# Patient Record
Sex: Female | Born: 1955 | Race: White | Hispanic: No | Marital: Single | Smoking: Never smoker
Health system: Southern US, Community
[De-identification: ages and names within clinical notes are randomized; demographics above are authoritative.]

## PROBLEM LIST (undated history)

## (undated) DIAGNOSIS — Z8709 Personal history of other diseases of the respiratory system: Secondary | ICD-10-CM

## (undated) DIAGNOSIS — I10 Essential (primary) hypertension: Secondary | ICD-10-CM

## (undated) DIAGNOSIS — M810 Age-related osteoporosis without current pathological fracture: Secondary | ICD-10-CM

## (undated) DIAGNOSIS — J309 Allergic rhinitis, unspecified: Secondary | ICD-10-CM

## (undated) HISTORY — DX: Allergic rhinitis, unspecified: J30.9

## (undated) HISTORY — PX: COLONOSCOPY: SHX174

## (undated) HISTORY — DX: Age-related osteoporosis without current pathological fracture: M81.0

---

## 2001-05-05 ENCOUNTER — Encounter: Payer: Self-pay | Admitting: Family Medicine

## 2001-05-05 ENCOUNTER — Ambulatory Visit (HOSPITAL_COMMUNITY): Admission: RE | Admit: 2001-05-05 | Discharge: 2001-05-05 | Payer: Self-pay | Admitting: Family Medicine

## 2001-07-21 ENCOUNTER — Ambulatory Visit (HOSPITAL_COMMUNITY): Admission: RE | Admit: 2001-07-21 | Discharge: 2001-07-21 | Payer: Self-pay | Admitting: General Surgery

## 2002-05-11 ENCOUNTER — Ambulatory Visit (HOSPITAL_COMMUNITY): Admission: RE | Admit: 2002-05-11 | Discharge: 2002-05-11 | Payer: Self-pay | Admitting: Family Medicine

## 2002-05-11 ENCOUNTER — Encounter: Payer: Self-pay | Admitting: Family Medicine

## 2002-05-18 ENCOUNTER — Ambulatory Visit (HOSPITAL_COMMUNITY): Admission: RE | Admit: 2002-05-18 | Discharge: 2002-05-18 | Payer: Self-pay | Admitting: Family Medicine

## 2002-05-18 ENCOUNTER — Encounter: Payer: Self-pay | Admitting: Family Medicine

## 2003-05-17 ENCOUNTER — Ambulatory Visit (HOSPITAL_COMMUNITY): Admission: RE | Admit: 2003-05-17 | Discharge: 2003-05-17 | Payer: Self-pay | Admitting: Family Medicine

## 2004-05-22 ENCOUNTER — Ambulatory Visit (HOSPITAL_COMMUNITY): Admission: RE | Admit: 2004-05-22 | Discharge: 2004-05-22 | Payer: Self-pay | Admitting: Family Medicine

## 2005-01-25 ENCOUNTER — Ambulatory Visit (HOSPITAL_COMMUNITY): Admission: RE | Admit: 2005-01-25 | Discharge: 2005-01-25 | Payer: Self-pay | Admitting: General Surgery

## 2005-05-24 ENCOUNTER — Ambulatory Visit (HOSPITAL_COMMUNITY): Admission: RE | Admit: 2005-05-24 | Discharge: 2005-05-24 | Payer: Self-pay | Admitting: Family Medicine

## 2006-02-14 ENCOUNTER — Encounter: Payer: Self-pay | Admitting: Orthopedic Surgery

## 2006-02-14 ENCOUNTER — Ambulatory Visit (HOSPITAL_COMMUNITY): Admission: RE | Admit: 2006-02-14 | Discharge: 2006-02-14 | Payer: Self-pay | Admitting: Family Medicine

## 2006-05-26 ENCOUNTER — Ambulatory Visit (HOSPITAL_COMMUNITY): Admission: RE | Admit: 2006-05-26 | Discharge: 2006-05-26 | Payer: Self-pay | Admitting: Family Medicine

## 2007-05-29 ENCOUNTER — Ambulatory Visit (HOSPITAL_COMMUNITY): Admission: RE | Admit: 2007-05-29 | Discharge: 2007-05-29 | Payer: Self-pay | Admitting: Family Medicine

## 2008-05-01 ENCOUNTER — Encounter: Payer: Self-pay | Admitting: Orthopedic Surgery

## 2008-05-01 ENCOUNTER — Ambulatory Visit (HOSPITAL_COMMUNITY): Admission: RE | Admit: 2008-05-01 | Discharge: 2008-05-01 | Payer: Self-pay | Admitting: Family Medicine

## 2008-05-29 ENCOUNTER — Ambulatory Visit (HOSPITAL_COMMUNITY): Admission: RE | Admit: 2008-05-29 | Discharge: 2008-05-29 | Payer: Self-pay | Admitting: Family Medicine

## 2008-06-26 ENCOUNTER — Ambulatory Visit: Payer: Self-pay | Admitting: Orthopedic Surgery

## 2008-06-26 DIAGNOSIS — M25569 Pain in unspecified knee: Secondary | ICD-10-CM | POA: Insufficient documentation

## 2008-06-26 DIAGNOSIS — M1710 Unilateral primary osteoarthritis, unspecified knee: Secondary | ICD-10-CM

## 2008-06-26 DIAGNOSIS — IMO0002 Reserved for concepts with insufficient information to code with codable children: Secondary | ICD-10-CM | POA: Insufficient documentation

## 2008-06-27 ENCOUNTER — Ambulatory Visit (HOSPITAL_COMMUNITY): Admission: RE | Admit: 2008-06-27 | Discharge: 2008-06-27 | Payer: Self-pay | Admitting: Family Medicine

## 2008-07-10 ENCOUNTER — Ambulatory Visit: Admission: RE | Admit: 2008-07-10 | Discharge: 2008-07-10 | Payer: Self-pay | Admitting: Orthopedic Surgery

## 2008-07-10 ENCOUNTER — Encounter: Payer: Self-pay | Admitting: Orthopedic Surgery

## 2009-06-03 ENCOUNTER — Ambulatory Visit (HOSPITAL_COMMUNITY): Admission: RE | Admit: 2009-06-03 | Discharge: 2009-06-03 | Payer: Self-pay | Admitting: Family Medicine

## 2010-06-05 ENCOUNTER — Ambulatory Visit (HOSPITAL_COMMUNITY)
Admission: RE | Admit: 2010-06-05 | Discharge: 2010-06-05 | Payer: Self-pay | Source: Home / Self Care | Attending: Family Medicine | Admitting: Family Medicine

## 2010-06-07 DIAGNOSIS — M810 Age-related osteoporosis without current pathological fracture: Secondary | ICD-10-CM

## 2010-06-07 HISTORY — DX: Age-related osteoporosis without current pathological fracture: M81.0

## 2010-06-24 ENCOUNTER — Ambulatory Visit (HOSPITAL_COMMUNITY)
Admission: RE | Admit: 2010-06-24 | Discharge: 2010-06-24 | Payer: Self-pay | Source: Home / Self Care | Attending: Family Medicine | Admitting: Family Medicine

## 2010-07-08 HISTORY — PX: OTHER SURGICAL HISTORY: SHX169

## 2010-07-14 ENCOUNTER — Other Ambulatory Visit: Payer: Self-pay | Admitting: Orthopedic Surgery

## 2010-07-14 ENCOUNTER — Ambulatory Visit (HOSPITAL_COMMUNITY)
Admission: RE | Admit: 2010-07-14 | Discharge: 2010-07-14 | Disposition: A | Discharge status: Home / Self Care | Payer: BC Managed Care – PPO | Source: Ambulatory Visit | Attending: Orthopedic Surgery | Admitting: Orthopedic Surgery

## 2010-07-14 ENCOUNTER — Other Ambulatory Visit (HOSPITAL_COMMUNITY): Payer: Self-pay | Admitting: Orthopedic Surgery

## 2010-07-14 ENCOUNTER — Encounter (HOSPITAL_COMMUNITY): Payer: BC Managed Care – PPO

## 2010-07-14 DIAGNOSIS — M169 Osteoarthritis of hip, unspecified: Secondary | ICD-10-CM

## 2010-07-14 DIAGNOSIS — Z01818 Encounter for other preprocedural examination: Secondary | ICD-10-CM | POA: Insufficient documentation

## 2010-07-14 LAB — SURGICAL PCR SCREEN: MRSA, PCR: NEGATIVE

## 2010-07-14 LAB — CBC
HCT: 43.7 % (ref 36.0–46.0)
RDW: 13.8 % (ref 11.5–15.5)
WBC: 6.5 10*3/uL (ref 4.0–10.5)

## 2010-07-14 LAB — BASIC METABOLIC PANEL
BUN: 11 mg/dL (ref 6–23)
CO2: 29 mEq/L (ref 19–32)
GFR calc non Af Amer: >60 mL/min (ref >60)
Glucose, Bld: 95 mg/dL (ref 70–99)
Potassium: 3.6 mEq/L (ref 3.5–5.1)

## 2010-07-14 LAB — APTT: aPTT: 33 seconds (ref 24–37)

## 2010-07-23 ENCOUNTER — Inpatient Hospital Stay (HOSPITAL_COMMUNITY)
Admission: RE | Admit: 2010-07-23 | Discharge: 2010-07-27 | DRG: 818 | Disposition: A | Discharge status: Home Health | Payer: BC Managed Care – PPO | Attending: Orthopedic Surgery | Admitting: Orthopedic Surgery

## 2010-07-23 ENCOUNTER — Inpatient Hospital Stay (HOSPITAL_COMMUNITY): Payer: BC Managed Care – PPO

## 2010-07-23 DIAGNOSIS — M1710 Unilateral primary osteoarthritis, unspecified knee: Secondary | ICD-10-CM | POA: Diagnosis present

## 2010-07-23 DIAGNOSIS — M169 Osteoarthritis of hip, unspecified: Principal | ICD-10-CM | POA: Diagnosis present

## 2010-07-23 DIAGNOSIS — M1610 Unilateral primary osteoarthritis, unspecified hip: Principal | ICD-10-CM | POA: Diagnosis present

## 2010-07-23 DIAGNOSIS — I10 Essential (primary) hypertension: Secondary | ICD-10-CM | POA: Diagnosis present

## 2010-07-23 DIAGNOSIS — D62 Acute posthemorrhagic anemia: Secondary | ICD-10-CM | POA: Diagnosis not present

## 2010-07-23 LAB — ABO/RH: ABO/RH(D): O POS

## 2010-07-24 LAB — CBC
Hemoglobin: 9.6 g/dL — ABNORMAL LOW (ref 12.0–15.0)
Platelets: 224 10*3/uL (ref 150–400)
RBC: 3.4 MIL/uL — ABNORMAL LOW (ref 3.87–5.11)

## 2010-07-24 LAB — PROTIME-INR
INR: 1.06 (ref 0.00–1.49)
Prothrombin Time: 14 seconds (ref 11.6–15.2)

## 2010-07-25 LAB — CBC
HCT: 23.3 % — ABNORMAL LOW (ref 36.0–46.0)
Hemoglobin: 7.3 g/dL — ABNORMAL LOW (ref 12.0–15.0)
MCH: 28.4 pg (ref 26.0–34.0)
MCHC: 32.2 g/dL (ref 30.0–36.0)
RBC: 2.64 MIL/uL — ABNORMAL LOW (ref 3.87–5.11)

## 2010-07-25 LAB — PROTIME-INR: INR: 1.85 — ABNORMAL HIGH (ref 0.00–1.49)

## 2010-07-26 LAB — CBC
MCHC: 32.7 g/dL (ref 30.0–36.0)
Platelets: 156 10*3/uL (ref 150–400)
RDW: 14.3 % (ref 11.5–15.5)
WBC: 6.3 10*3/uL (ref 4.0–10.5)

## 2010-07-26 LAB — TYPE AND SCREEN
ABO/RH(D): O POS
Unit division: 0

## 2010-07-26 LAB — PROTIME-INR: INR: 1.59 — ABNORMAL HIGH (ref 0.00–1.49)

## 2010-07-27 LAB — PROTIME-INR: INR: 1.45 (ref 0.00–1.49)

## 2010-07-31 NOTE — Op Note (Signed)
NAME:  Michelle Landry, Michelle Landry                   ACCOUNT NO.:  [id number]  MEDICAL RECORD NO.:  [id number]           PATIENT TYPE:  I  LOCATION:  1617                         FACILITY:  [facility]  PHYSICIAN:  Marlowe Kays, M.D.  DATE OF BIRTH:  [birth year]  DATE OF PROCEDURE:  07/23/2010 DATE OF DISCHARGE:                              OPERATIVE REPORT   PREOPERATIVE DIAGNOSIS:  Osteoarthritis, left hip.  POSTOPERATIVE DIAGNOSIS:  Osteoarthritis, left hip.  OPERATION:  Osteonics total hip replacement, left.  SURGEON:  Marlowe Kays, M.D.  ASSISTANT:  Georges Lynch. Darrelyn Hillock, M.D.  ANESTHESIA:  General.  PATHOLOGIC JUSTIFICATION FOR PROCEDURE:  Painful left hip with no joint space remaining superiorly and only partial coverage of her femoral head.  PROCEDURE:  Prophylactic antibiotics, satisfactory general anesthesia, Foley catheter inserted, right lateral decubitus position with left hip prepped with DuraPrep and draped in sterile field.  Collier Flowers and Mark II frame employed.  Time-out performed.  Posterolateral incision down to the fascia lata.  External rotators were detached from the femur and the hip capsule partially excised and hip dislocated.  I then amputated the femoral head at the top femoral neck and cleared the piriformis fossa of soft tissue.  I then placed a guide pin down in it over drilling with a step-cut drill followed by the canal finder.  We then began the vertical reaming process up to number 8 and along the way made my final femoral neck cut roughly a fingerbreadth above the lesser trochanter.  I then began rasping the canal and worked up to size 8 which was a nice tight fit.  I then switched to the acetabulum and completed the capsulectomy and then began deepening and expanding reamer process with some medialization for the femoral head to be more covered.  We went up to 54 size reamer with good cancellous bone.  Trial prosthesis fit nicely.  We then went ahead and placed  the final Trident PSL acetabular shell which I stabilized with 2 screws.  I followed this with a 46-mm insert with 28- mm head.  We then returned to the femur and placed the final size 8, secured fit 127-degree angle noncemented component and then sized it at +5.  Accordingly, the final cap was placed on the C tapered head, used a Biolox femoral head.  Hip was reduced and found be nice and stable.  We then irrigated the wound well with sterile saline and closed the wound with interrupted #1 Vicryl starting at the external rotators and fascia lata, #1 and 2-0 Vicryl in subcutaneous tissue, staples in the skin. Betadine, Adaptic dry sterile dressings were applied.  She was placed in abduction pillow while still in the Medina II frame and gently placed on her PACU bed and taken in a satisfactory condition with no known complications.  Estimated blood loss was 800 cc.  No blood replacement.          ______________________________ Marlowe Kays, M.D.     JA/MEDQ  D:  07/23/2010  T:  07/24/2010  Job:  [id number]  Electronically Signed by Marlowe Kays M.D. on 07/31/2010 04:22:36 PM

## 2010-08-24 NOTE — Discharge Summary (Signed)
NAME:  Michelle Landry, Michelle Landry                   ACCOUNT NO.:  [id number]  MEDICAL RECORD NO.:  [id number]           PATIENT TYPE:  I  LOCATION:  1617                         FACILITY:  [facility]  PHYSICIAN:  Marlowe Kays, M.D.  DATE OF BIRTH:  Jan 12, 1956  DATE OF ADMISSION:  07/23/2010 DATE OF DISCHARGE:  07/27/2010                              DISCHARGE SUMMARY   ADMITTING DIAGNOSES: 1. Osteoarthritis of left hip. 2. Osteoarthritis of right knee. 3. Hypertension.  DISCHARGE DIAGNOSES: 1. Osteoarthritis of left hip. 2. Osteoarthritis of right knee. 3. Hypertension. 4. Mild postoperative anemia, treated with transfusion.  OPERATION:  On July 23, 2010, the patient underwent Osteonics total hip replacement arthroplasty of the left hip, Dr. Ranee Gosselin assisted.  BRIEF HISTORY:  This 55 year old lady had increasing problems concerning her left hip.  She also had problem with the right knee and osteoarthritis of both of them.  She stands on concrete floor for long periods of time due to her job and she is having considerable amount of problems performing that.  She showed deterioration of that hip and the right knee as well.  We after much consideration and discussion expelled that the left hip was bothering her more than the knee and it was decided to go ahead with the above procedure.  COURSE IN HOSPITAL:  The patient tolerated surgical procedure quite well.  She was placed in the total hip protocol postoperatively as well as placed on Coumadin protocol.  She worked diligently with physical therapy maintaining appropriate weightbearing status on the operative side.  She did have hypotensive episode postoperatively.  This was felt due to accumulation of her medications and analgesics and once those were changed, she had no problem after that.  Over the weekend, prior to discharge, she worked with Physical Therapy, received transfusion for 2 units of packed cells which made her feel  much better and more into the protocol.  The wound remained clean and dry postoperatively.  The neurovascular was intact to the operative side.  It was felt she could be maintained in home environment and arrangements were made for discharge.  LABORATORY DATA:  Laboratory values in the hospital, postoperatively she had a drop in her hemoglobin which was expected and she did drop to 7.3 on the hemoglobin and 23.3 on the hematocrit.  It was felt this was too low for safety of the patient, so she was transfused 2 units of blood which brought her up to 8.7, hematocrit was 26.6.  INR in the hospital was 1.45.  MRSA was negative.  CONDITION ON DISCHARGE:  Improved, stable.  PLAN:  The patient is to continue with her multivitamins.  She takes potassium chloride 20 mEq every morning, indapamide 1.25 mg dose daily, lisinopril 20 mg dose every day.  She was given Robaxin as a muscle relaxant and Tylox for pain control.  Coumadin protocol, continue 4 weeks after date of surgery.  She will return to see Dr. Simonne Come in about 2 weeks.  Dry dressing to the wound on as-needed basis and she is urged to call the office should she have any problems  or questions.     Dooley L. Cherlynn June.   ______________________________ Marlowe Kays, M.D.    DLU/MEDQ  D:  08/20/2010  T:  08/20/2010  Job:  [id number]  cc:   Donna Bernard, M.D. Fax: [phone]  Marlowe Kays, M.D. Fax: [phone]  Electronically Signed by Marlowe Kays M.D. on 08/24/2010 11:21:59 AM

## 2010-12-06 HISTORY — PX: OTHER SURGICAL HISTORY: SHX169

## 2010-12-17 ENCOUNTER — Encounter (HOSPITAL_COMMUNITY): Payer: BC Managed Care – PPO

## 2010-12-17 ENCOUNTER — Other Ambulatory Visit: Payer: Self-pay | Admitting: Orthopedic Surgery

## 2010-12-17 LAB — APTT: aPTT: 29 seconds (ref 24–37)

## 2010-12-17 LAB — BASIC METABOLIC PANEL
GFR calc Af Amer: >60 mL/min (ref >60)
GFR calc non Af Amer: >60 mL/min (ref >60)
Glucose, Bld: 97 mg/dL (ref 70–99)
Potassium: 3.9 mEq/L (ref 3.5–5.1)
Sodium: 137 mEq/L (ref 135–145)

## 2010-12-17 LAB — CBC
Hemoglobin: 13.4 g/dL (ref 12.0–15.0)
MCH: 28 pg (ref 26.0–34.0)
RBC: 4.79 MIL/uL (ref 3.87–5.11)
WBC: 4.6 10*3/uL (ref 4.0–10.5)

## 2010-12-17 LAB — PROTIME-INR: Prothrombin Time: 12.9 seconds (ref 11.6–15.2)

## 2010-12-17 LAB — SURGICAL PCR SCREEN
MRSA, PCR: NEGATIVE
Staphylococcus aureus: NEGATIVE

## 2010-12-17 NOTE — H&P (Addendum)
Michelle Landry, Michelle Landry                   ACCOUNT NO.:  [id number]  MEDICAL RECORD NO.:  [id number]  LOCATION:                                 FACILITY:  PHYSICIAN:  Marlowe Kays, M.D.  DATE OF BIRTH:  1955-11-14  DATE OF ADMISSION: DATE OF DISCHARGE:                             HISTORY & PHYSICAL   DATE OF ADMISSION:  December 23, 2010  CHIEF COMPLAINT:  Pain in my right knee.  PRESENT ILLNESS:  This 55 year old white female has been seen by [place] for continued progressive problems concerning pain into her right knee.  She has had problems, which now has markedly interfered with her day-to-day activities.  She has had problems with this knee throughout the years and has continued with a rather laborious job now for quite a while. She has developed considerable amount of arthritic changes, particularly in the medial compartment of the right knee with bone-on-bone deformity. This appears to be severe end-stage arthritis of this knee.  She is a relatively young lady and she would like to maintain as much activity and quality of life as she possibly can.  She has successfully undergone right total knee replacement arthroplasty, has done very well with that and is highly desirous to have a right knee done.  After all the risks and benefits of surgery were described to the patient, then we decided to go ahead with total knee replacement arthroplasty.  PAST MEDICAL HISTORY:  This lady has been in relatively good health throughout her lifetime under the direction of Dr. Simone Curia.  PAST MEDICAL HISTORY:  She is allergic to SULFA drugs, which mainly cause stomach discomfort and PERCOCET causes dizziness and nausea; however, she has no problems with Vicodin.  She has no food, latex, or metal allergies.  CURRENT MEDICATIONS: 1. Indapamide 1.25 mg every morning. 2. Lisinopril 20 mg tablets 1 q.a.m. 3. Potassium chloride 20 mEq tabs each morning with food. 4. Meloxicam 15 mg each morning  with food. 5. One-A-Day ladies vitamin 1 in the morning. 6. Motrin IV when needed (will not take prior to surgery). 7. Tums p.r.n.  She had been treated primarily for hypertension.  She also has some mild reflux.  PAST SURGICAL HISTORY:  Right total knee replacement arthroplasty done at [facility] on July 23, 2010.  FAMILY HISTORY:  Positive for father dying at 64 of a massive coronary event.  Mother with osteoporosis, macular degeneration, and arthritis. She has an older sister who died of congestive heart failure and the family history is positive for arthritis and fibromyalgia.  SOCIAL HISTORY:  The patient is single.  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  She never had intake of alcohol or tobacco products.  Her home environment consists of a family unit.  She lives with her mother.  She plans to have home health with [facility] after surgery.  REVIEW OF SYSTEMS:  CNS:  No seizures, stroke, paralysis, numbness, double vision.  RESPIRATORY:  No productive cough, no hemoptysis, no shortness of breath.  CARDIOVASCULAR:  No chest pain, no angina, no orthopnea.  GASTROINTESTINAL:  No nausea, vomiting, melena, bleeding stool.  GENITOURINARY:  No discharge, dysuria, hematuria. MUSCULOSKELETAL:  Pleasant, well-groomed 55 year old white female who does walk with somewhat of a lift of the right knee trying to guard it.  PHYSICAL EXAMINATION:  VITAL SIGNS:  Blood pressure 128/64, seated right arm.  Respirations are 12 and unlabored, pulse 72 and regular. HEENT:  Normocephalic.  PERRLA.  EOMs intact.  Oropharynx is clear. CHEST:  Clear to auscultation.  No rhonchi, no rales. HEART:  Regular rate and rhythm.  No murmurs were heard. ABDOMEN:  Soft, nontender.  Liver and spleen not felt. GENITALIA:  Not done, not pertinent to present illness. RECTAL:  Not done, not pertinent to present illness. PELVIC:  Not  done, not pertinent to present illness. BREASTS:  Not done, not pertinent to present illness. EXTREMITIES:  She has pain and crepitus with range of motion of right knee with some mild valgus deformity.  ADMISSION DIAGNOSES: 1. Osteoarthritis of the right knee. 2. Hypertension. 3. Status post left hip replacement arthroplasty.  PLAN:  The patient will undergo right total knee replacement arthroplasty.  Plan is to go home after regular hospitalization with home health with [place].  Today, all questions were answered again.  No medications were asked for, nor given.     Crystalina Stodghill L. Cherlynn June.   ______________________________ Marlowe Kays, M.D.    DLU/MEDQ  D:  12/14/2010  T:  12/14/2010  Job:  [id number]  cc:   Donna Bernard, M.D. Fax: [phone]  Electronically Signed by Marlowe Kays M.D. on 12/17/2010 12:20:05 PM Electronically Signed by Alvera Novel  on 01/08/2011 04:20:37 PM

## 2010-12-23 ENCOUNTER — Inpatient Hospital Stay (HOSPITAL_COMMUNITY): Payer: BC Managed Care – PPO

## 2010-12-23 ENCOUNTER — Inpatient Hospital Stay (HOSPITAL_COMMUNITY)
Admission: RE | Admit: 2010-12-23 | Discharge: 2010-12-27 | DRG: 209 | Disposition: A | Discharge status: Home Health | Payer: BC Managed Care – PPO | Source: Ambulatory Visit | Attending: Orthopedic Surgery | Admitting: Orthopedic Surgery

## 2010-12-23 DIAGNOSIS — E876 Hypokalemia: Secondary | ICD-10-CM | POA: Diagnosis not present

## 2010-12-23 DIAGNOSIS — Z96649 Presence of unspecified artificial hip joint: Secondary | ICD-10-CM

## 2010-12-23 DIAGNOSIS — I10 Essential (primary) hypertension: Secondary | ICD-10-CM | POA: Diagnosis present

## 2010-12-23 DIAGNOSIS — D649 Anemia, unspecified: Secondary | ICD-10-CM | POA: Diagnosis not present

## 2010-12-23 DIAGNOSIS — M1710 Unilateral primary osteoarthritis, unspecified knee: Principal | ICD-10-CM | POA: Diagnosis present

## 2010-12-23 DIAGNOSIS — Z01812 Encounter for preprocedural laboratory examination: Secondary | ICD-10-CM

## 2010-12-23 LAB — CROSSMATCH: ABO/RH(D): O POS

## 2010-12-24 LAB — CBC
HCT: 33.1 % — ABNORMAL LOW (ref 36.0–46.0)
MCH: 28.4 pg (ref 26.0–34.0)
MCV: 87.8 fL (ref 78.0–100.0)
Platelets: 221 10*3/uL (ref 150–400)
RDW: 13.2 % (ref 11.5–15.5)

## 2010-12-24 LAB — BASIC METABOLIC PANEL
BUN: 12 mg/dL (ref 6–23)
Calcium: 9 mg/dL (ref 8.4–10.5)
Creatinine, Ser: 0.62 mg/dL (ref 0.50–1.10)
GFR calc Af Amer: >60 mL/min (ref >60)

## 2010-12-24 NOTE — Op Note (Signed)
Michelle Landry, Michelle Landry                   ACCOUNT NO.:  [id number]  MEDICAL RECORD NO.:  [id number]  LOCATION:  0003                         FACILITY:  [facility]  PHYSICIAN:  Marlowe Kays, M.D.  DATE OF BIRTH:  1955-06-09  DATE OF PROCEDURE:  12/23/2010 DATE OF DISCHARGE:                              OPERATIVE REPORT   PREOPERATIVE DIAGNOSIS:  Osteoarthritis, right knee.  POSTOPERATIVE DIAGNOSIS:  Osteoarthritis, right knee.  OPERATION:  Osteonics total knee replacement on the right.  SURGEON:  Marlowe Kays, M.D.  ASSISTANTDruscilla Brownie. Idolina Primer, P.A-C.  ANESTHESIA:  General.  PLAN/JUSTIFICATION FOR PROCEDURE:  She had bone-on-bone abutment in the medial knee with significant pain.  DESCRIPTION OF PROCEDURE:  Prophylactic antibiotics, satisfied general anesthesia, Foley catheter inserted with protection of the left total hip, lateral hip positioner, pneumatic tourniquet, right leg was prepped with DuraPrep from tourniquet to ankle and draped in sterile field. Ioban employed.  Time-out performed.  Vertical midline incision down to patellar mechanism with median parapatellar incision to open the joint. Pes anserinus and medial collateral ligament were undermined off the proximal tibia.  The patellar mechanism was freed up, patella everted and the knee flexed.  Osteophytes were removed from around the patella and the femur, patella sized at 26.  I removed portions of the anterior menisci and ACL, PCL, and made a 5-6 inch drill hole in the distal femur followed by the canal finder and the axis aligner for a 5 degree valgus cut.  I took 10 mm off the distal femur since she had no flexion contracture.  We then sized the distal femur with a sizing jig at a #7. I followed this with the distal cutting jig making anterior and posterior cuts and posterior and anterior chamferings.  Returning to the tibia, I removed additional remnants of the menisci and ACL, PCL complex with a leveling  cut.  I then sized the proximal tibia at 7 and using the baseplate made my initial drill hole followed by step-cut drill, canal finder, and intramedullary rod.  We then used the 9 degrees cutting jig and the feeler for taking 4 mm off the slightly depressed medial tibial plateau, 4 mm cup was made.  I then placed lamina spreader removing remnants of bone and soft tissue from behind the femoral condyles.  I followed this with the jig for creating both the patellar groove and the notchplasty and after completing these we went through a trial reduction, found a 10 mm spacer fit nicely.  Using the external rod, I split the bimalleolar distance and found the components to be stable at this location.  After marking scribe lines on the anterior tibia, we continued the knee extension and went ahead and used the 10 mm recess cutting jig to cut for the patella followed by the guide for making three fixation holes.  I then used a trial patella and removed bone from around the perimeter until the patellar button was prior to the surrounding perimeter.  Then we turned to the tibia and I used the size 7 baseplate attaching to the proximal tibia with 3 pins and then used tripod apparatus to ream from the tibial  keel up to the 7 cemented. Following this, I used a hand reamer and reamed up to 13 for intramedullary stem, used the box cut and then went through a trial reduction with the 8 mm x 13 mm stem attached to the tibial component. This went nicely.  Accordingly, we went ahead and assembled the components, mixed methylmethacrylate and irrigated the wound well with the Waterpik.  I then individually glued in the components starting first with the tibia, placing methylmethacrylate only on the tibial component, impacting it and removing excess methylmethacrylate. Followed this by gluing in the femur and again impacting and removing excess methylmethacrylate with the 10 mm spacer in place, we held  the knee in extension while we glued in the patella, holding the patella with holding clamp.  When methylmethacrylate hardened, we checked and removed small amounts of particulate methylmethacrylate from around the components and then placed the final 10 mm posterior stabilized size 7 insert reducing the knee and found it to be nice and stable.  I did perform a lateral release.  I infiltrated all soft tissues with 0.25% Marcaine with adrenaline and also placed some Gelfoam in the distal femoral hole.  Hemovac was placed and the wound was then closed in layers with interrupted #1 Vicryl, in two layers in the quadriceps tendon and distally in the synovium and capsule.  Subcutaneous tissue was closed with combination of the #1 and 2-0 Vicryl, staples on the skin.  Betadine, Adaptic, dry sterile dressing were applied.  Tourniquet was released with less than 2 hours of tourniquet time having elapsed. At the time of this dictation, she was on her way to recovery room in satisfactory condition with no known complications and no blood loss.          ______________________________ Marlowe Kays, M.D.     JA/MEDQ  D:  12/23/2010  T:  12/23/2010  Job:  [id number]  Electronically Signed by Marlowe Kays M.D. on 12/24/2010 12:59:55 PM

## 2010-12-25 LAB — CBC
HCT: 25.4 % — ABNORMAL LOW (ref 36.0–46.0)
MCHC: 33.5 g/dL (ref 30.0–36.0)
MCV: 86.7 fL (ref 78.0–100.0)
Platelets: 192 10*3/uL (ref 150–400)
RDW: 13.4 % (ref 11.5–15.5)
WBC: 8.3 10*3/uL (ref 4.0–10.5)

## 2010-12-25 LAB — BASIC METABOLIC PANEL
BUN: 17 mg/dL (ref 6–23)
Chloride: 99 mEq/L (ref 96–112)
Creatinine, Ser: 0.73 mg/dL (ref 0.50–1.10)
GFR calc Af Amer: >60 mL/min (ref >60)
GFR calc non Af Amer: >60 mL/min (ref >60)
Potassium: 4.4 mEq/L (ref 3.5–5.1)

## 2010-12-26 LAB — BASIC METABOLIC PANEL
CO2: 30 mEq/L (ref 19–32)
Chloride: 102 mEq/L (ref 96–112)
Chloride: 99 mEq/L (ref 96–112)
GFR calc Af Amer: >60 mL/min (ref >60)
Potassium: 2.9 mEq/L — ABNORMAL LOW (ref 3.5–5.1)
Potassium: 3.2 mEq/L — ABNORMAL LOW (ref 3.5–5.1)
Sodium: 137 mEq/L (ref 135–145)
Sodium: 136 mEq/L (ref 135–145)

## 2010-12-26 LAB — CBC
HCT: 21.2 % — ABNORMAL LOW (ref 36.0–46.0)
MCHC: 33 g/dL (ref 30.0–36.0)
RDW: 13.7 % (ref 11.5–15.5)
WBC: 6.2 10*3/uL (ref 4.0–10.5)

## 2010-12-27 LAB — BASIC METABOLIC PANEL
CO2: 29 mEq/L (ref 19–32)
Calcium: 8.9 mg/dL (ref 8.4–10.5)
Chloride: 102 mEq/L (ref 96–112)
Glucose, Bld: 99 mg/dL (ref 70–99)
Potassium: 3.6 mEq/L (ref 3.5–5.1)
Sodium: 137 mEq/L (ref 135–145)

## 2010-12-27 LAB — CBC
HCT: 23.3 % — ABNORMAL LOW (ref 36.0–46.0)
Hemoglobin: 7.9 g/dL — ABNORMAL LOW (ref 12.0–15.0)
MCV: 84.7 fL (ref 78.0–100.0)
Platelets: 252 10*3/uL (ref 150–400)
RBC: 2.75 MIL/uL — ABNORMAL LOW (ref 3.87–5.11)
WBC: 7.2 10*3/uL (ref 4.0–10.5)

## 2011-01-26 ENCOUNTER — Ambulatory Visit (HOSPITAL_COMMUNITY)
Admission: RE | Admit: 2011-01-26 | Discharge: 2011-01-26 | Disposition: A | Discharge status: Home / Self Care | Payer: BC Managed Care – PPO | Source: Ambulatory Visit | Attending: Orthopedic Surgery | Admitting: Orthopedic Surgery

## 2011-01-26 DIAGNOSIS — IMO0001 Reserved for inherently not codable concepts without codable children: Secondary | ICD-10-CM | POA: Insufficient documentation

## 2011-01-26 DIAGNOSIS — M25569 Pain in unspecified knee: Secondary | ICD-10-CM | POA: Insufficient documentation

## 2011-01-26 DIAGNOSIS — M25561 Pain in right knee: Secondary | ICD-10-CM | POA: Insufficient documentation

## 2011-01-26 DIAGNOSIS — R262 Difficulty in walking, not elsewhere classified: Secondary | ICD-10-CM | POA: Insufficient documentation

## 2011-01-26 DIAGNOSIS — M6281 Muscle weakness (generalized): Secondary | ICD-10-CM | POA: Insufficient documentation

## 2011-01-26 DIAGNOSIS — M25669 Stiffness of unspecified knee, not elsewhere classified: Secondary | ICD-10-CM | POA: Insufficient documentation

## 2011-01-26 NOTE — Progress Notes (Signed)
Physical Therapy Evaluation  Patient Details  Name: Michelle Landry MRN: [id number] Date of Birth: [birth year]  Today's Date: 01/26/2011 Time: 4098-1191 Time Calculation (min): 45 min Visit#: 1 of 12 Re-eval: 02/25/11  Past Medical History: No past medical history on file. Past Surgical History: No past surgical history on file.  Subjective Symptoms/Limitations Symptoms: Pt states that she had a right total knee replacement on July 18th.  Pt states the the stiffness is bothering her the most.   The patient is icing 3 times a day but as soon as she sits done her swelling starts.  The patient states tha she can not even eat without her knee being propped up.  The patient is currently ambulating with a can with decreased weight bearing on her right.  Pt is currently taking pain and mm relaxors with temporay relief. How long can you sit comfortably?: uncomfortable after five minutes.  Pt is having difficult getting in and out of car. How long can you stand comfortably?: Pt states that she can stand for about ten minutes. How long can you walk comfortably?: Pt is walking with a cane for less than five minutes. Pain Assessment Currently in Pain?: Yes Pain Score:   4 (highest is a 7) Pain Location: Knee Pain Orientation: Right Pain Type: Acute pain;Surgical pain Pain Radiating Towards: goes down her leg. Pain Onset: 1 to 4 weeks ago Pain Frequency: Constant Pain Relieving Factors: meds, unable to tell if ice is helping. Effect of Pain on Daily Activities: The more activity the patient has the more pain she has.  Precautions/Restrictions  Precautions Precautions:  (Pt had a left hip replacement in Feb.  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 ) Leisure:  (working in the yard.)  Financial risk analyst Overall Cognitive Status: Appears within functional limits for tasks assessed Arousal/Alertness: Awake/alert Orientation Level: Oriented X4  Sensation/Coordination/Flexibility    Assessment RLE AROM (degrees) Right Knee Extension 0-130: 3  Right Knee Flexion 0-140: 80  RLE Strength Right Hip Flexion: 4/5 Right Hip Extension: 3+/5 Right Hip ABduction:  (4-) Right Hip ADduction: 4/5 Right Knee Flexion: 4/5 Right Knee Extension: 3/5 Right Ankle Dorsiflexion: 3+/5 Right Ankle Plantar Flexion:  (4-)  Mobility (including Balance)       Exercise/Treatments completed for strength and stretching Manual Therapy Manual Therapy: Edema management Edema Management: retro massage  Physical Therapy Assessment and Plan PT Assessment and Plan Clinical Impression Statement: Pt with significant weakness and ROM deficits who will benefit from skilled therapy to maximize functional potential PT Frequency: Min 3X/week PT Duration: 4 weeks PT Treatment/Interventions: Therapeutic exercise;Other (comment) (modalities to decrease swelling.) PT Plan: see 3x wk for 4 wks. to return to previous functional state.    Goals PT Short Term Goals Time to Complete Goals: 2 weeks PT Short Term Goal 1: I HEP PT Short Term Goal 2: Pain level decreased by 3 PT Short Term Goal 3: knee flex to 95 degrees to allow pt to sit comfortable for 30 min PT Long Term Goals PT Long Term Goal 1: I advance HEP- 3 week PT Long Term Goal  2: Pain level decreased 5 level 4 weeks Long Term Goal 3: ROM increased to 115 to allow sitting to be comfortable for an hour Long Term Goal 4: strength to be increased one grade to allow pt to walk without a  cane for 30 min.-4 wk  Problem List Patient Active Problem List  Diagnoses  . OSTEOARTHRITIS, KNEES, BILATERAL  . KNEE PAIN  . Muscle weakness (generalized)  . Stiffness of joint, not elsewhere classified, lower leg  . Knee pain, right    PT - End of Session Activity Tolerance: Patient tolerated treatment well General Behavior During Session: [facility] for tasks performed Cognition: [facility] for tasks performed   Jermari Tamargo,CINDY 01/26/2011, 9:41 AM  Physician Documentation Your signature is required to indicate approval of the treatment plan as stated above.  Please sign and either send electronically or make a copy of this report for your files and return this physician signed original.   Please mark one 1.__approve of plan  2. ___approve of plan with the following conditions.   ______________________________                                                          _____________________ Physician Signature                                                                                                             Date

## 2011-01-26 NOTE — Patient Instructions (Signed)
HEP

## 2011-01-28 ENCOUNTER — Ambulatory Visit (HOSPITAL_COMMUNITY)
Admission: RE | Admit: 2011-01-28 | Discharge: 2011-01-28 | Disposition: A | Discharge status: Home / Self Care | Payer: BC Managed Care – PPO | Source: Ambulatory Visit | Attending: *Deleted | Admitting: *Deleted

## 2011-01-28 NOTE — Progress Notes (Signed)
Physical Therapy Treatment Patient Details  Name: Michelle Landry MRN: [id number] Date of Birth: 1956-02-03  Today's Date: 01/28/2011 Time: 4098-1191 Time Calculation (min): 44 min Visit#: 3 of 12 Re-eval: 02/25/11 Charges: Therex x 26' Manual x 8' Ice x 1 unit  Subjective: Symptoms/Limitations Symptoms: It's pretty stiff today. Pain Assessment Currently in Pain?: Yes Pain Score:   4 Pain Location: Knee Pain Orientation: Right   Exercise/Treatments Standing Heel Raises: 10 reps (toe raises also x 10) Knee Flexion: 10 reps Functional Squat: 10 reps Seated Long Arc Quad: Right;10 reps Supine Quad Sets: Right;10 reps Heel Slides: Right;10 reps Bridges: 10 reps Straight Leg Raises: 10 reps SAQ x 10  Modalities Modalities: Cryotherapy Manual Therapy Manual Therapy: Edema management Edema Management: Retro massage x 8' Cryotherapy Number Minutes Cryotherapy: 10 Minutes Cryotherapy Location: Knee (right) Type of Cryotherapy: Ice pack  Physical Therapy Assessment and Plan PT Assessment and Plan Clinical Impression Statement: Pt with difficulty with SAQ and SLR secondary to decreased quad strength. Decreased swelling after retro massage and ice pack. PT Treatment/Interventions: Therapeutic exercise;Other (comment) (Manual therapy x 8' Ice x 10') PT Plan: Continue to progress per PT POC.     Problem List Patient Active Problem List  Diagnoses  . OSTEOARTHRITIS, KNEES, BILATERAL  . KNEE PAIN  . Muscle weakness (generalized)  . Stiffness of joint, not elsewhere classified, lower leg  . Knee pain, right    PT - End of Session Activity Tolerance: Patient tolerated treatment well General Behavior During Session: [facility] for tasks performed Cognition: [facility] for tasks performed  [name] 01/28/2011, 10:15 AM

## 2011-02-01 ENCOUNTER — Ambulatory Visit (HOSPITAL_COMMUNITY)
Admission: RE | Admit: 2011-02-01 | Discharge: 2011-02-01 | Disposition: A | Discharge status: Home / Self Care | Payer: BC Managed Care – PPO | Source: Ambulatory Visit | Attending: *Deleted | Admitting: *Deleted

## 2011-02-01 ENCOUNTER — Other Ambulatory Visit: Payer: Self-pay | Admitting: Family Medicine

## 2011-02-01 DIAGNOSIS — Z09 Encounter for follow-up examination after completed treatment for conditions other than malignant neoplasm: Secondary | ICD-10-CM

## 2011-02-01 NOTE — Progress Notes (Signed)
Physical Therapy Treatment Patient Details  Name: Michelle Landry MRN: [id number] Date of Birth: 07-13-1955  Today's Date: 02/01/2011 Time: 9147-8295 Time Calculation (min): 62 min Visit#: 3 of 12 Re-eval: 02/25/11 Charges: Therex 35' ; Manual therapy x 10' ; Ice x 1 unit  Subjective: Symptoms/Limitations Symptoms: It seems pretty swollen this morning. Pain Assessment Currently in Pain?: Yes Pain Score:   3 Pain Location: Knee Pain Orientation: Right  Precautions/Restrictions  L hip replacement posterior approach February 2012   Exercise/Treatments Aerobic Tread Mill: 5'@1 .0-1.2 Standing Heel Raises: 15 reps (toe raises also x 15) Knee Flexion: 15 reps Lateral Step Up: 10 reps Functional Squat: 15 reps Rocker Board: 1 minute Seated Long Arc Quad: 15 reps;Right Supine Quad Sets: 15 reps Heel Slides: 15 reps Bridges: 15 reps Straight Leg Raises: 15 reps Sidelying Abduction RLE x 10 Prone  Hip ext x 16 HS curl x 10  Modalities Modalities: Cryotherapy Manual Therapy Manual Therapy: Other (comment) Edema Management: Retro massage to RLE x 8' Other Manual Therapy: PROM to R knee to increase flexion x 2' Cryotherapy Number Minutes Cryotherapy: 10 Minutes Cryotherapy Location: Knee (Right) Type of Cryotherapy: Ice pack  Physical Therapy Assessment and Plan PT Assessment and Plan Clinical Impression Statement: Pt displays increase quad control with therex this tx. Began new standing therex without difficulty after VC for proper technique. Began walking on TM with VC to increase R knee flexion and utilize heel-toe pattern. PT Treatment/Interventions: Therapeutic exercise;Other (comment) (Manual therapy x 8' ; Ice x 10') PT Plan: Continue to progrees. Begin prone quad stretch next tx.    Goals PT Short Term Goals Time to Complete Goals: 2 weeks PT Short Term Goal 1: I HEP PT Short Term Goal 1 - Progress: Met PT Short Term Goal 2: Pain level decreased by 3 PT  Short Term Goal 2 - Progress: Progressing toward goal PT Short Term Goal 3: knee flex to 95 degrees to allow pt to sit comfortable for 30 min PT Short Term Goal 3 - Progress: Progressing toward goal PT Long Term Goals PT Long Term Goal 1: I advance HEP- 3 week PT Long Term Goal 1 - Progress: Progressing toward goal PT Long Term Goal 2: Pain level decreased 5 level 4 weeks PT Long Term Goal 2 - Progress: Progressing toward goal Long Term Goal 3: ROM increased to 115 to allow sitting to be comfortable for an hour Long Term Goal 3 Progress: Progressing toward goal Long Term Goal 4: strength to be increased one grade to allow pt to walk without a cane for 30 min.-4 wk Long Term Goal 4 Progress: Progressing toward goal  Problem List Patient Active Problem List  Diagnoses  . OSTEOARTHRITIS, KNEES, BILATERAL  . KNEE PAIN  . Muscle weakness (generalized)  . Stiffness of joint, not elsewhere classified, lower leg  . Knee pain, right    PT - End of Session Activity Tolerance: Patient tolerated treatment well General Behavior During Session: [facility] for tasks performed Cognition: [facility] for tasks performed  [name] 02/01/2011, 9:42 AM

## 2011-02-03 ENCOUNTER — Ambulatory Visit (HOSPITAL_COMMUNITY)
Admission: RE | Admit: 2011-02-03 | Discharge: 2011-02-03 | Disposition: A | Discharge status: Home / Self Care | Payer: BC Managed Care – PPO | Source: Ambulatory Visit | Attending: Physical Therapy | Admitting: Physical Therapy

## 2011-02-03 NOTE — Progress Notes (Signed)
Physical Therapy Treatment Patient Details  Name: Michelle Landry MRN: [id number] Date of Birth: [birth year]  Today's Date: 02/03/2011 Time: 4098-1191 Time Calculation (min): 56 min Visit#: 4 of 12 Re-eval: 02/25/11 Charges:  therex 30', Massage 10', ice 10'   Precautions/Restrictions  L hip replacement posterior approach February 2012   Subjective: Symptoms/Limitations Symptoms: Pt. reports she is stiff today.  s/p R TKR Pain Assessment Pain Score:   3 Pain Location: Knee Pain Orientation: Right     Exercise/Treatments   Aerobic Tread Mill: 5'@1 .0-1.2     Standing Heel Raises: 20 reps Knee Flexion: 15 reps;Limitations Knee Flexion Limitations: terminal flexion with 6" step Lateral Step Up: 10 reps;Step Height: 4" Forward Step Up: 10 reps;Step Height: 4" Step Down: 10 reps;Step Height: 4" Functional Squat: 15 reps Seated Long Arc Quad: 15 reps Supine Quad Sets: 20 reps Heel Slides: 20 reps Bridges: 20 reps Straight Leg Raises: 20 reps Sidelying Hip ABduction: 15 reps Prone  Hamstring Curl: 15 reps Hip Extension: 15 reps Quad stretch (add next visit)   Modalities Modalities: Cryotherapy Manual Therapy Manual Therapy:  (Retro massage to R LE 10' to decrease edema) Other Manual Therapy: PROM to increase R knee flexion Cryotherapy Number Minutes Cryotherapy: 10 Minutes Cryotherapy Location: Knee Type of Cryotherapy: Ice pack  Physical Therapy Assessment and Plan PT Assessment and Plan Clinical Impression Statement: Progressing with ROM; continues to be swollen knee to foot but retro massage helping to decrease edema.  Difficulty controlling eccentric phase with step downs secondary to weakness. PT Treatment/Interventions: Therapeutic exercise;Other (comment) (Retro massage to R knee, ice) PT Plan: Continue to progress, add quad stretch next visit in prone.       Problem List Patient Active Problem List  Diagnoses  . OSTEOARTHRITIS, KNEES, BILATERAL    . KNEE PAIN  . Muscle weakness (generalized)  . Stiffness of joint, not elsewhere classified, lower leg  . Knee pain, right    PT - End of Session Activity Tolerance: Patient tolerated treatment well General Behavior During Session: [facility] for tasks performed Cognition: [facility] for tasks performed  Emeline Gins B 02/03/2011, 10:13 AM

## 2011-02-04 ENCOUNTER — Ambulatory Visit (HOSPITAL_COMMUNITY)
Admission: RE | Admit: 2011-02-04 | Discharge: 2011-02-04 | Disposition: A | Discharge status: Home / Self Care | Payer: BC Managed Care – PPO | Source: Ambulatory Visit | Attending: Orthopedic Surgery | Admitting: Orthopedic Surgery

## 2011-02-04 NOTE — Progress Notes (Signed)
Physical Therapy Treatment Patient Details  Name: Michelle Landry MRN: [id number] Date of Birth: Nov 03, 1955  Today's Date: 02/04/2011 Time: 1191-4782 Time Calculation (min): 55 min Visit#: 5 of 12 Re-eval: 02/25/11 Charges: Therex x 30' Manual x 8' Ice x 1 unit  Subjective: Symptoms/Limitations Symptoms: I'm stiff this morning. Pain Assessment Currently in Pain?: Yes Pain Score:   2 Pain Location: Knee Pain Orientation: Right   Exercise/Treatments Aerobic  Tread Mill: 5'@1 .0-1.2  Standing  Heel Raises: 20 reps  Knee Flexion: 10 reps 3#  Lateral Step Up: 10 reps;Step Height: 4"  Forward Step Up: 10 reps;Step Height: 4"  Step Down: 10 reps;Step Height: 4"  Functional Squat: 15 reps  Seated  Long Arc Quad: 10 reps w/3# Supine  Bridges: 20 reps w/arms crossed over chest Straight Leg Raises: 20 reps  Sidelying  Hip ABduction: 15 reps  Prone  Hamstring Curl: 15 reps  Hip Extension: 15 reps  Modalities Modalities: Cryotherapy Manual Therapy Manual Therapy: Edema management Edema Management: Retro massage to RLE x 8'  Cryotherapy Number Minutes Cryotherapy: 10 Minutes Cryotherapy Location: Knee Type of Cryotherapy: Ice pack  Physical Therapy Assessment and Plan PT Assessment and Plan Clinical Impression Statement: Pt with extensor lag with SLR. PT with decreased R quad control with eccentric contraction of quad. PT Treatment/Interventions: Therapeutic exercise;Other (comment) (Manual therapy, Ice) PT Plan: Continue per PT POC. Begin quad stretch in prone next tx.    Problem List Patient Active Problem List  Diagnoses  . OSTEOARTHRITIS, KNEES, BILATERAL  . KNEE PAIN  . Muscle weakness (generalized)  . Stiffness of joint, not elsewhere classified, lower leg  . Knee pain, right    PT - End of Session Activity Tolerance: Patient tolerated treatment well General Behavior During Session: [facility] for tasks performed Cognition: [facility] for tasks performed  [name] 02/04/2011, 1:00 PM

## 2011-02-10 ENCOUNTER — Ambulatory Visit (HOSPITAL_COMMUNITY)
Admission: RE | Admit: 2011-02-10 | Discharge: 2011-02-10 | Disposition: A | Discharge status: Home / Self Care | Payer: BC Managed Care – PPO | Source: Ambulatory Visit | Attending: Family Medicine | Admitting: Family Medicine

## 2011-02-10 DIAGNOSIS — M25569 Pain in unspecified knee: Secondary | ICD-10-CM | POA: Insufficient documentation

## 2011-02-10 DIAGNOSIS — M6281 Muscle weakness (generalized): Secondary | ICD-10-CM | POA: Insufficient documentation

## 2011-02-10 DIAGNOSIS — IMO0001 Reserved for inherently not codable concepts without codable children: Secondary | ICD-10-CM | POA: Insufficient documentation

## 2011-02-10 DIAGNOSIS — M25669 Stiffness of unspecified knee, not elsewhere classified: Secondary | ICD-10-CM | POA: Insufficient documentation

## 2011-02-10 DIAGNOSIS — R262 Difficulty in walking, not elsewhere classified: Secondary | ICD-10-CM | POA: Insufficient documentation

## 2011-02-10 NOTE — Progress Notes (Signed)
Physical Therapy Treatment Patient Details  Name: Lavaughn Bisig MRN: [id number] Date of Birth: 30-Jun-1955  Today's Date: 02/10/2011 Time: 4098-1191 Time Calculation (min): 62 min Visit#: 6 of 12 Re-eval: 02/25/11 Charges:  Therex 30' , manual 15', ice 10'  Subjective: Symptoms/Limitations Symptoms: pain 3/10 today.  Antalgic gait upon entrance to dept. Pain Assessment Currently in Pain?: Yes Pain Score:   3 Pain Location: Knee Pain Orientation: Right  Precautions/Restrictions :  S/p L [facility] February 2012  Exercise/Treatments  Aerobic  Tread Mill: 6'@1 .2 mph  Standing  Heel Raises: 20 reps  Knee Flexion: 15 reps 3#  Lateral Step Up: 15 reps;Step Height: 4"  Forward Step Up: 15 reps;Step Height: 4"  Step Down: 10 reps;Step Height: 2"  Functional Squat: 15 reps  Seated  Long Arc Quad: 15 reps w/3#  Supine  Bridges: 20 reps w/arms crossed over chest  Straight Leg Raises: 2 sets 10 reps Isometric Hip Add 10X5" Sidelying  Hip ABduction: 15 reps with 3# Prone  Hamstring Curl: 15 reps with 3# Hip Extension: 15 reps  Quad Stretch 3X30" (add next visit)  Modalities Modalities: Cryotherapy Manual Therapy Manual Therapy: Edema management Edema Management: Retro massage to R LE X 10' Other Manual Therapy: MFR to decrease adhesions/scar; PROM to increase knee flexion Cryotherapy Number Minutes Cryotherapy: 10 Minutes Cryotherapy Location: Knee Type of Cryotherapy: Ice pack  Physical Therapy Assessment and Plan PT Assessment and Plan Clinical Impression Statement: difficult to achieve greater flexion with PROM secondary to pain; MFR added to decrease adhesions to knee with good results; weight added to prone curls and SL hip abd without difficulty.  Also added supine hip add isometric (due to L hip precautions).   PT Treatment/Interventions: Therapeutic exercise;Other (comment) (Manual therapy, ice) PT Plan: Progress strength and ROM.  Add prone quad stretch and increase  speed on treadmill to decrease antalgia/normalize gait.   Problem List Patient Active Problem List  Diagnoses  . OSTEOARTHRITIS, KNEES, BILATERAL  . KNEE PAIN  . Muscle weakness (generalized)  . Stiffness of joint, not elsewhere classified, lower leg  . Knee pain, right    PT - End of Session Activity Tolerance: Patient tolerated treatment well General Behavior During Session: [facility] for tasks performed Cognition: [facility] for tasks performed  Bascom Levels, Hurman Ketelsen B 02/10/2011, 11:11 AM

## 2011-02-12 ENCOUNTER — Ambulatory Visit (HOSPITAL_COMMUNITY)
Admission: RE | Admit: 2011-02-12 | Discharge: 2011-02-12 | Disposition: A | Discharge status: Home / Self Care | Payer: BC Managed Care – PPO | Source: Ambulatory Visit | Attending: Family Medicine | Admitting: Family Medicine

## 2011-02-12 DIAGNOSIS — M6281 Muscle weakness (generalized): Secondary | ICD-10-CM

## 2011-02-12 DIAGNOSIS — M25669 Stiffness of unspecified knee, not elsewhere classified: Secondary | ICD-10-CM

## 2011-02-12 DIAGNOSIS — M25561 Pain in right knee: Secondary | ICD-10-CM

## 2011-02-12 NOTE — Progress Notes (Signed)
Physical Therapy Treatment Patient Details  Name: Michelle Landry MRN: [id number] Date of Birth: 11/29/1955  Today's Date: 02/12/2011 Time: 1191-4782 Time Calculation (min): 59 min Visit#: 7 of 12 Re-eval: 02/25/11  Subjective: Symptoms/Limitations Symptoms: I'm feeling stiff this morning.   Pain Assessment Pain Score:   4 Pain Location: Knee Pain Orientation: Right;Lateral Pain Type: Acute pain Pain Onset: 1 to 4 weeks ago Effect of Pain on Daily Activities: increases  Precautions/Restrictions   none  Mobility (including Balance)   ambulating with cane.    Exercise/Treatments  for stretching and strengthening per flow sheet.   Modalities Modalities: Cryotherapy Manual Therapy Manual Therapy: Edema management Edema Management: retro-massage Cryotherapy Number Minutes Cryotherapy: 10 Minutes Cryotherapy Location: Knee Type of Cryotherapy: Ice pack  Physical Therapy Assessment and Plan PT Assessment and Plan Clinical Impression Statement: added prone contract relax to increase ROM.   Rehab Potential: Good PT Plan: instructed pt to ice 4-5 times a day to try and decrease swelling.   Goals  ROM to be 0 -120.  Ambulate without assistive device. Begin ambulation without cane. Problem List Patient Active Problem List  Diagnoses  . OSTEOARTHRITIS, KNEES, BILATERAL  . KNEE PAIN  . Muscle weakness (generalized)  . Stiffness of joint, not elsewhere classified, lower leg  . Knee pain, right    PT - End of Session Activity Tolerance: Patient tolerated treatment well General Behavior During Session: [facility] for tasks performed Cognition: [facility] for tasks performed  Safal Halderman,CINDY 02/12/2011, 12:04 PM

## 2011-02-16 ENCOUNTER — Ambulatory Visit (HOSPITAL_COMMUNITY)
Admission: RE | Admit: 2011-02-16 | Discharge: 2011-02-16 | Disposition: A | Discharge status: Home / Self Care | Payer: BC Managed Care – PPO | Source: Ambulatory Visit | Attending: Family Medicine | Admitting: Family Medicine

## 2011-02-16 DIAGNOSIS — M25669 Stiffness of unspecified knee, not elsewhere classified: Secondary | ICD-10-CM

## 2011-02-16 DIAGNOSIS — M25561 Pain in right knee: Secondary | ICD-10-CM

## 2011-02-16 DIAGNOSIS — M6281 Muscle weakness (generalized): Secondary | ICD-10-CM

## 2011-02-16 NOTE — Progress Notes (Signed)
Physical Therapy Treatment Patient Details  Name: Michelle Landry MRN: [id number] Date of Birth: 1956/03/05  Today's Date: 02/16/2011 Time: 8295-6213 Time Calculation (min): 54 min Charges: 36' TE, 8' man, 1 ice Visit#: 8 of 12 Re-eval: 02/25/11  Subjective: Symptoms/Limitations Symptoms: Pt reports she is still stiff.  having most difficulty with bending her knee and has some pain when she lifts it up (i.e. into bed).  Exercise/Treatments Stretches Knee: Self-Stretch to increase Flexion: 3 reps;60 seconds Aerobic Tread Mill: 6' 1.2 Machines for Strengthening Cybex Knee Extension: 1 PL BLE x10 Standing Heel Raises: 20 reps Knee Flexion: 20 reps Knee Flexion Limitations: 3# Lateral Step Up: Right;20 reps;Step Height: 4" Forward Step Up: 20 reps;Step Height: 4";Right Step Down: Right;20 reps;Step Height: 2" Functional Squat: 15 reps Rocker Board: 2 minutes Rocker Board Limitations: right/left SLS: 3x30" w/min touch down, best time 30" 1x   Manual Therapy Manual Therapy: Edema management Other Manual Therapy:  RLE retro-massage  x10 min Cryotherapy Number Minutes Cryotherapy: 10 Minutes Cryotherapy Location: Knee Type of Cryotherapy: Ice pack  Physical Therapy Assessment and Plan PT Assessment and Plan Clinical Impression Statement: Pt had improve hip stability with stair exercises and tolerated increased reps and weight without difficulty.  Pt has improved static balance and ankle strategy.  PT Plan: Add: stairwell and heel/toe walking.  Cont per PT POC    Goals    Problem List Patient Active Problem List  Diagnoses  . OSTEOARTHRITIS, KNEES, BILATERAL  . KNEE PAIN  . Muscle weakness (generalized)  . Stiffness of joint, not elsewhere classified, lower leg  . Knee pain, right    PT - End of Session Activity Tolerance: Patient tolerated treatment well  Trestin Vences 02/16/2011, 10:09 AM

## 2011-02-18 ENCOUNTER — Ambulatory Visit (HOSPITAL_COMMUNITY)
Admission: RE | Admit: 2011-02-18 | Discharge: 2011-02-18 | Disposition: A | Discharge status: Home / Self Care | Payer: BC Managed Care – PPO | Source: Ambulatory Visit | Attending: Orthopedic Surgery | Admitting: Orthopedic Surgery

## 2011-02-18 DIAGNOSIS — M25561 Pain in right knee: Secondary | ICD-10-CM

## 2011-02-18 DIAGNOSIS — M6281 Muscle weakness (generalized): Secondary | ICD-10-CM

## 2011-02-18 DIAGNOSIS — M25669 Stiffness of unspecified knee, not elsewhere classified: Secondary | ICD-10-CM

## 2011-02-18 NOTE — Progress Notes (Signed)
Physical Therapy Treatment Patient Details  Name: Riah Kehoe MRN: [id number] Date of Birth: 05/03/1956  Today's Date: 02/18/2011 Time: 0802-0915 Time Calculation (min): 73 min Visit#:9 of 12 Re-eval: 02/25/11 Charge:  Ex: 48 IP 12 Subjective: Symptoms/Limitations Symptoms: Pt is stiff.   Pain Assessment Pain Score:   3 Pain Location: Knee Pain Orientation: Right  Precautions/Restrictions     Mobility (including Balance)       Exercise/Treatments  For strengthening and stretching per doc flow sheet. Modalities Modalities: Cryotherapy Manual Therapy Manual Therapy: Edema management Cryotherapy Number Minutes Cryotherapy: 12 Minutes Cryotherapy Location: Knee Type of Cryotherapy: Ice pack  Physical Therapy Assessment and Plan PT Assessment and Plan Clinical Impression Statement: Pt has difficulty with step down with knee giving way changed size of step to 2" PT Plan: begin stairwell next treatment.    Goals    Problem List Patient Active Problem List  Diagnoses  . OSTEOARTHRITIS, KNEES, BILATERAL  . KNEE PAIN  . Muscle weakness (generalized)  . Stiffness of joint, not elsewhere classified, lower leg  . Knee pain, right    PT - End of Session Activity Tolerance: Patient tolerated treatment well General Behavior During Session: [facility] for tasks performed Cognition: [facility] for tasks performed  Mollee Neer,CINDY 02/18/2011, 12:44 PM

## 2011-02-19 ENCOUNTER — Ambulatory Visit (HOSPITAL_COMMUNITY)
Admission: RE | Admit: 2011-02-19 | Discharge: 2011-02-19 | Disposition: A | Discharge status: Home / Self Care | Payer: BC Managed Care – PPO | Source: Ambulatory Visit | Attending: Family Medicine | Admitting: Family Medicine

## 2011-02-19 DIAGNOSIS — M25669 Stiffness of unspecified knee, not elsewhere classified: Secondary | ICD-10-CM

## 2011-02-19 DIAGNOSIS — M6281 Muscle weakness (generalized): Secondary | ICD-10-CM

## 2011-02-19 DIAGNOSIS — M25561 Pain in right knee: Secondary | ICD-10-CM

## 2011-02-19 NOTE — Progress Notes (Signed)
Physical Therapy Treatment Patient Details  Name: Michelle Landry MRN: [id number] Date of Birth: Mar 30, 1956  Today's Date: 02/19/2011 Time: 7829-5621 Time Calculation (min): 52 min Visit#: 10 of 12 Re-eval: 02/25/11    Subjective: Symptoms/Limitations Symptoms: Pt states stiff more than pain Pain Assessment Pain Score:   3 Pain Location: Knee Pain Orientation: Right  Precautions/Restrictions     Mobility (including Balance)       Exercise/Treatments For stretching and strengthening  Manual Therapy Manual Therapy: Myofascial release Edema Management: retromassage for decreased swelling  Physical Therapy Assessment and Plan PT Assessment and Plan Clinical Impression Statement: pt with improved ROM PT Plan: continue to progress pt.   stairs in department next treatment    Goals    Problem List Patient Active Problem List  Diagnoses  . OSTEOARTHRITIS, KNEES, BILATERAL  . KNEE PAIN  . Muscle weakness (generalized)  . Stiffness of joint, not elsewhere classified, lower leg  . Knee pain, right    PT - End of Session Activity Tolerance: Patient tolerated treatment well General Behavior During Session: [facility] for tasks performed Cognition: [facility] for tasks performed  Michelle Landry,CINDY 02/19/2011, 4:12 PM

## 2011-02-22 ENCOUNTER — Ambulatory Visit (HOSPITAL_COMMUNITY)
Admission: RE | Admit: 2011-02-22 | Discharge: 2011-02-22 | Disposition: A | Discharge status: Home / Self Care | Payer: BC Managed Care – PPO | Source: Ambulatory Visit | Attending: Orthopedic Surgery | Admitting: Orthopedic Surgery

## 2011-02-22 DIAGNOSIS — M25669 Stiffness of unspecified knee, not elsewhere classified: Secondary | ICD-10-CM

## 2011-02-22 DIAGNOSIS — M25561 Pain in right knee: Secondary | ICD-10-CM

## 2011-02-22 DIAGNOSIS — M6281 Muscle weakness (generalized): Secondary | ICD-10-CM

## 2011-02-22 NOTE — Progress Notes (Signed)
Physical Therapy Treatment Patient Details  Name: Michelle Landry MRN: [id number] Date of Birth: 31-Dec-1955  Today's Date: 02/22/2011 Time: 1308-6578 Time Calculation (min): 56 min Visit#: 11 of 12 Re-eval: 02/25/11    Subjective: Symptoms/Limitations Symptoms: Pt states she is stiff, she is icing 5 times a day.  Pain Assessment Pain Score:   3 Pain Location: Knee Pain Orientation: Right  Precautions/Restrictions     Mobility (including Balance)       Exercise/Treatments Stretches   Aerobic Stationary Bike: 6'@ 3.0 Machines for Strengthening   Plyometrics   Standing Knee Flexion: Strengthening;20 reps Knee Flexion Limitations: 3 Lateral Step Up: 1 set;20 reps;Step Height: 2" Forward Step Up: Right;20 reps;Step Height: 4" Step Down: Right;20 reps;Hand Hold: 1;Step Height: 2" Functional Squat: 20 reps Rocker Board: 2 minutes SLS with Vectors:  (3x10") Seated   Supine Quad Sets: 15 reps Heel Slides: 15 reps Terminal Knee Extension: 15 reps Sidelying   Prone  Hip Extension: 15 reps Contract/Relax to Increase Flexion:  (4#) Other Prone Exercises:  (contract /relax x5) Other Prone Exercises: terminal extension x 10   Manual Therapy Edema Management: edema massage to decrease swelling  Physical Therapy Assessment and Plan PT Assessment and Plan Clinical Impression Statement: decreased heat and edema today Rehab Potential: Good PT Plan: re-assess next visit    Goals    Problem List Patient Active Problem List  Diagnoses  . OSTEOARTHRITIS, KNEES, BILATERAL  . KNEE PAIN  . Muscle weakness (generalized)  . Stiffness of joint, not elsewhere classified, lower leg  . Knee pain, right    PT - End of Session Activity Tolerance: Patient tolerated treatment well General Behavior During Session: [facility] for tasks performed Cognition: [facility] for tasks performed  Michelle Landry,Michelle Landry 02/22/2011, 9:40 AM

## 2011-02-22 NOTE — Patient Instructions (Signed)
Complete 100 q-sets/day.

## 2011-02-24 ENCOUNTER — Ambulatory Visit (HOSPITAL_COMMUNITY)
Admission: RE | Admit: 2011-02-24 | Discharge: 2011-02-24 | Disposition: A | Discharge status: Home / Self Care | Payer: BC Managed Care – PPO | Source: Ambulatory Visit | Attending: Family Medicine | Admitting: Family Medicine

## 2011-02-24 DIAGNOSIS — Z09 Encounter for follow-up examination after completed treatment for conditions other than malignant neoplasm: Secondary | ICD-10-CM

## 2011-02-24 DIAGNOSIS — N63 Unspecified lump in unspecified breast: Secondary | ICD-10-CM | POA: Insufficient documentation

## 2011-02-25 ENCOUNTER — Ambulatory Visit (HOSPITAL_COMMUNITY)
Admission: RE | Admit: 2011-02-25 | Discharge: 2011-02-25 | Disposition: A | Discharge status: Home / Self Care | Payer: BC Managed Care – PPO | Source: Ambulatory Visit | Attending: Orthopedic Surgery | Admitting: Orthopedic Surgery

## 2011-02-25 NOTE — Progress Notes (Signed)
Physical Therapy Treatment Patient Details  Name: Michelle Landry MRN: [id number] Date of Birth: February 26, 1956  Today's Date: 02/25/2011 Time: 4098-1191 Time Calculation (min): 57 min Visit#: 12  of 12   Re-eval:   Charges: Therex 32' Manual x 8'  Subjective: Symptoms/Limitations Symptoms: It's stiff today.  Pain Assessment Currently in Pain?: Yes Pain Score:   4 Pain Location: Knee Pain Orientation: Right   Exercise/Treatments Standing Knee Flexion: 15 reps Knee Flexion Limitations: 3# Lateral Step Up: 10 reps;Step Height: 4";Right Forward Step Up: Right;20 reps;Step Height: 4" Step Down: 10 reps;Step Height: 4";Right Functional Squat: 20 reps Rocker Board: 2 minutes SLS with Vectors: 5x10" Supine Quad Sets: 20 reps Terminal Knee Extension: 20 reps Prone  Contract relax x 5'  Modalities Modalities: Cryotherapy Manual Therapy Manual Therapy: Myofascial release Edema Management: RLE retro massage to decrease swelling x 5' Myofascial Release: MFR/STM to decrease scar tissue to R knee x 3' Cryotherapy Number Minutes Cryotherapy: 10 Minutes Cryotherapy Location: Knee (Right) Type of Cryotherapy: Ice pack  Physical Therapy Assessment and Plan PT Assessment and Plan Clinical Impression Statement: Pt continue to display increases in strength/ROM. Pt with increased heat on knee today which decreased after manual therapy and ice. Pt reports pain decrease to 3/10 at end of session. PT Treatment/Interventions: Therapeutic exercise;Other (comment) (Manual therapy; Ice) PT Plan: Reassess next tx.     Problem List Patient Active Problem List  Diagnoses  . OSTEOARTHRITIS, KNEES, BILATERAL  . KNEE PAIN  . Muscle weakness (generalized)  . Stiffness of joint, not elsewhere classified, lower leg  . Knee pain, right    PT - End of Session Activity Tolerance: Patient tolerated treatment well General Behavior During Session: [facility] for tasks performed Cognition: [facility] for tasks  performed  [name] 02/25/2011, 9:45 AM

## 2011-02-26 ENCOUNTER — Ambulatory Visit (HOSPITAL_COMMUNITY)
Admission: RE | Admit: 2011-02-26 | Discharge: 2011-02-26 | Disposition: A | Discharge status: Home / Self Care | Payer: BC Managed Care – PPO | Source: Ambulatory Visit | Attending: Family Medicine | Admitting: Family Medicine

## 2011-02-26 DIAGNOSIS — M6281 Muscle weakness (generalized): Secondary | ICD-10-CM

## 2011-02-26 DIAGNOSIS — M25561 Pain in right knee: Secondary | ICD-10-CM

## 2011-02-26 DIAGNOSIS — M25669 Stiffness of unspecified knee, not elsewhere classified: Secondary | ICD-10-CM

## 2011-02-26 NOTE — Patient Instructions (Addendum)
hep

## 2011-02-26 NOTE — Progress Notes (Signed)
Physical Therapy Re-Evaluation  Patient Details  Name: Michelle Landry MRN: [id number] Date of Birth: [birth year]  Today's Date: 02/26/2011 Time: 1348-1500 Time Calculation (min): 72 min Visit#: 13  of 18   Re-eval: 03/12/11    Past Medical History: No past medical history on file. Past Surgical History: No past surgical history on file.  Subjective Symptoms/Limitations Symptoms: my pain is at a 3 it's sore because I've been working it. Pain Assessment Pain Score:   3 Pain Location: Knee Pain Orientation: Right Pain Type: Surgical pain        Assessment RLE AROM (degrees) Right Knee Extension 0-130: 0  Was 3 Right Knee Flexion 0-140: 102  Was 80 RLE Strength Right Hip Flexion: 5/5 (extension lag due to quad weakness in extended position) was 4 Right Hip Extension: 4/5 was 3 Right Hip ABduction: 5/5 was 4- Right Hip ADduction: 5/5 was 4 Right Knee Flexion: 4/5 was 4 Right Knee Extension: 3+/5 was 3 Right Ankle Dorsiflexion: 5/5 was 3+ Right Ankle Plantar Flexion: 5/5 was 3+ Exercise/Treatments Strengthening and stretching to increase ROM and strength.      Physical Therapy Assessment and Plan PT Assessment and Plan Clinical Impression Statement: Pt has progressed well.  Pt will benefit from continued PT to address lack of motion and strength affecting pt ability to ambulate without an assistive device. Rehab Potential: Good Clinical Impairments Affecting Rehab Potential: decreased strength, decreased ROM PT Duration:  (2 more weeks) PT Treatment/Interventions: Therapeutic exercise;Other (comment) (ice and manual therapy) PT Plan: need to concentrate on quad/ham and hip ext strength;  Beging PRE for each of the above.  Work harder on flexion begin chair lunges walking lunges.    Goals Home Exercise Program PT Goal: Perform Home Exercise Program - Progress: Met PT Short Term Goals PT Short Term Goal 1 - Progress: Met PT Short Term Goal 2 - Progress: Met PT  Short Term Goal 3 - Progress: Met PT Short Term Goal 4: new goal- 2 wk ROM to be to 110 to allow squatting PT Short Term Goal 5: quad/ham and hip extension strength to be increased by 1/2 grade to make going up and down inclines/ steps more stable.  Problem List Patient Active Problem List  Diagnoses  . OSTEOARTHRITIS, KNEES, BILATERAL  . KNEE PAIN  . Muscle weakness (generalized)  . Stiffness of joint, not elsewhere classified, lower leg  . Knee pain, right    PT - End of Session Activity Tolerance: Patient tolerated treatment well General Behavior During Session: [facility] for tasks performed Cognition: [facility] for tasks performed   [place] Catarino,CINDY 02/26/2011, 3:12 PM  Physician Documentation Your signature is required to indicate approval of the treatment plan as stated above.  Please sign and either send electronically or make a copy of this report for your files and return this physician signed original.   Please mark one 1.__approve of plan  2. ___approve of plan with the following conditions.   ______________________________                                                          _____________________ Physician Signature  Date  

## 2011-03-02 ENCOUNTER — Ambulatory Visit (HOSPITAL_COMMUNITY)
Admission: RE | Admit: 2011-03-02 | Discharge: 2011-03-02 | Disposition: A | Discharge status: Home / Self Care | Payer: BC Managed Care – PPO | Source: Ambulatory Visit | Attending: Orthopedic Surgery | Admitting: Orthopedic Surgery

## 2011-03-02 NOTE — Progress Notes (Signed)
Physical Therapy Treatment Patient Details  Name: Michelle Landry MRN: [id number] Date of Birth: [birth year]  Today's Date: 03/02/2011 Time: 0800-0904 Time Calculation (min): 64 min Visit#: 14  of 18   Re-eval: 03/12/11 Charges:  Therex 31', Manual 16', ice 10'    Subjective: Symptoms/Limitations Symptoms: R knee is stiff and sore.  3/10 pain today.  Pt. was re-evaled 02/26/11; to see for 2 more weeks (6 visits). Pain Assessment Currently in Pain?: Yes Pain Score:   3 Pain Location: Knee Pain Orientation: Right   Exercise/Treatments Stretches Chair stretch for flexion (begin next visit)  Aerobic Stationary Bike: 6'@ 3.0 seat 9 Machines for Strengthening (R only)s Cybex Quad PRE's 2pl, 1.5pl, 1 pl (begin next visit) Cybex Hamstring PRE's 4pl, 3pl, 2pl (begin next visit)  Plyometrics Progressive lunges (begin next visit)  Standing Knee Flexion: 20 reps Knee Flexion Limitations: 4# Terminal Knee Extension: 15 reps Lateral Step Up: 15 reps;Step Height: 4" Forward Step Up: 15 reps;Step Height: 4" Step Down: 15 reps;Step Height: 2" Functional Squat: 20 reps SLS with Vectors: 5x10" Supine Heel Slides: 15 reps Terminal Knee Extension: 20 reps Other Supine Knee Exercises: PROM flex after heelslides Prone  Hamstring Curl: 15 reps;Limitations Hamstring Curl Limitations: TKF Other Prone Exercises: PROM   Modalities Modalities: Cryotherapy Manual Therapy Myofascial Release: to decrease scar tissue to R knee Other Manual Therapy: Retro Massage to decrease swelling. Cryotherapy Number Minutes Cryotherapy: 10 Minutes Cryotherapy Location: Knee (Right) Type of Cryotherapy: Ice pack  Physical Therapy Assessment and Plan PT Assessment and Plan Clinical Impression Statement: Able to increase weight with standing knee flexion.  Had to decrease forward step downs to 2" step due to pain.  Pt. with decreased tolerance to contract relax today; only achieving 98 degrees  flexion.. PT Plan: Begin quad/ham machine PRE's, progressive lunges and chair stretch next visit.    Problem List Patient Active Problem List  Diagnoses  . OSTEOARTHRITIS, KNEES, BILATERAL  . KNEE PAIN  . Muscle weakness (generalized)  . Stiffness of joint, not elsewhere classified, lower leg  . Knee pain, right    PT - End of Session Activity Tolerance: Patient tolerated treatment well General Behavior During Session: [facility] for tasks performed Cognition: [facility] for tasks performed  Bascom Levels, Amy B 03/02/2011, 9:00 AM

## 2011-03-03 ENCOUNTER — Ambulatory Visit (HOSPITAL_COMMUNITY)
Admission: RE | Admit: 2011-03-03 | Discharge: 2011-03-03 | Disposition: A | Discharge status: Home / Self Care | Payer: BC Managed Care – PPO | Source: Ambulatory Visit | Attending: Orthopedic Surgery | Admitting: Orthopedic Surgery

## 2011-03-03 DIAGNOSIS — M6281 Muscle weakness (generalized): Secondary | ICD-10-CM

## 2011-03-03 DIAGNOSIS — M25669 Stiffness of unspecified knee, not elsewhere classified: Secondary | ICD-10-CM

## 2011-03-03 DIAGNOSIS — M25561 Pain in right knee: Secondary | ICD-10-CM

## 2011-03-03 NOTE — Patient Instructions (Addendum)
100 quad sets per day

## 2011-03-03 NOTE — Progress Notes (Signed)
Physical Therapy Treatment Patient Details  Name: Narelle Schoening MRN: [id number] Date of Birth: 05/27/1956  Today's Date: 03/03/2011 Time: 1015-1108 Time Calculation (min): 53 min Visit#: 15  of 18   Re-eval: 03/12/11    Subjective:  Symptoms/Limitations Symptoms: My knee is numb there isn't a lot of pain maybe a 1 or 2. Pain Assessment Currently in Pain?: Yes Pain Score:   2 Pain Location: Knee Pain Orientation: Right     Objective:  Exercises for stretching and strengthening per doc flow sheet.  [place] stimulation was used to promote terminal extension strength.  Gait training without assistive device.    Modalities Modalities: Copywriter, advertising Location: Russian stim while knee on towel roll to try and increase strengh of terminal extension. Electrical Stimulation Action: Herbalist Parameters: 100bpp; 12 Ma-cc  Physical Therapy Assessment and Plan PT Assessment and Plan Clinical Impression Statement: Pt with increased swelling please avoid cybex extension machine.   Clinical Impairments Affecting Rehab Potential: decreased terminal extension strength PT Plan: begin PRE for knee ext, flexion hip extension, work on terminal ext in supine with russian stim as well as in sitting without stim. work on gt with no assitive device heel toe gait.    Goals   Improved flexion and strength. Problem List Patient Active Problem List  Diagnoses  . OSTEOARTHRITIS, KNEES, BILATERAL  . KNEE PAIN  . Muscle weakness (generalized)  . Stiffness of joint, not elsewhere classified, lower leg  . Knee pain, right    PT - End of Session Activity Tolerance: Patient tolerated treatment well General Behavior During Session: [facility] for tasks performed Cognition: [facility] for tasks performed  RUSSELL,CINDY 03/03/2011, 11:07 AM

## 2011-03-04 ENCOUNTER — Ambulatory Visit (HOSPITAL_COMMUNITY)
Admission: RE | Admit: 2011-03-04 | Discharge: 2011-03-04 | Disposition: A | Discharge status: Home / Self Care | Payer: BC Managed Care – PPO | Source: Ambulatory Visit | Attending: Physical Therapy | Admitting: Physical Therapy

## 2011-03-04 DIAGNOSIS — M6281 Muscle weakness (generalized): Secondary | ICD-10-CM

## 2011-03-04 DIAGNOSIS — M25561 Pain in right knee: Secondary | ICD-10-CM

## 2011-03-04 DIAGNOSIS — M25669 Stiffness of unspecified knee, not elsewhere classified: Secondary | ICD-10-CM

## 2011-03-04 NOTE — Progress Notes (Signed)
Physical Therapy Treatment Patient Details  Name: Dilpreet Faires MRN: [id number] Date of Birth: March 05, 1956  Today's Date: 03/04/2011 Time: 2130-8657 Time Calculation (min): 72 min Visit#: 16  of 18   Re-eval: 03/12/11 Charges:  Therex 30', [place] Estim 10', manual 8', ice 10'    Subjective: Symptoms: Knee continues to be numb.  Pain increased to a 3/10 today.  Precautions/Restrictions :  S/P Left THR February 2012.  Exercise/Treatments UPRIGHT Bike: 6' 4 holes showing Standing Knee Flexion: 20 reps Knee Flexion Limitations: 4# Terminal Knee Extension: 15 reps Lateral Step Up: 15 reps;Step Height: 4" Seated Long Arc Quad: 10 reps Long [employer] Limitations: 2#, 3#, 4# Supine Quad Sets: 15 reps;Limitations Short Arc Quad Sets: 15 reps (with [place] Stim) Heel Slides: 15 reps Terminal Knee Extension: AAROM;Right;Limitations Terminal Knee Extension Limitations: Russian stim Other Supine Knee Exercises: PROM flex after heelslides Prone  Hamstring Curl: 10 reps;Limitations Hamstring Curl Limitations: 2#, 3#, 4# Other Prone Exercises: PROM   Modalities Modalities: Electrical Stimulation Manual Therapy Myofascial Release: to decrease scar tissue to R inferior knee 8 minutes Cryotherapy Number Minutes Cryotherapy: 10 Minutes Cryotherapy Location: Knee Type of Cryotherapy: Ice pack Pharmacologist Location: Russian stim R quadricep with semi-circle roll under knee for TKE Electrical Stimulation Action: Software engineer Parameters:  10/50 duty cycle at 15 mA CC for 10 minutes  Physical Therapy Assessment and Plan PT Assessment and Plan Clinical Impression Statement: Pt with good eccentric/poor concentric control with lateral step ups.  Continued large amount of scar tissue at inferior knee but able to loosen with MFR. PT Treatment/Interventions: Therapeutic exercise;Neuromuscular re-education;Gait training PT Plan:  Continue to progress R quad concentric strength and work on heel/toe gait without AD.    Problem List Patient Active Problem List  Diagnoses  . OSTEOARTHRITIS, KNEES, BILATERAL  . KNEE PAIN  . Muscle weakness (generalized)  . Stiffness of joint, not elsewhere classified, lower leg  . Knee pain, right    PT - End of Session Activity Tolerance: Patient tolerated treatment well General Behavior During Session: [facility] for tasks performed Cognition: [facility] for tasks performed  Emeline Gins B 03/04/2011, 9:13 AM

## 2011-03-09 ENCOUNTER — Ambulatory Visit (HOSPITAL_COMMUNITY)
Admission: RE | Admit: 2011-03-09 | Discharge: 2011-03-09 | Disposition: A | Discharge status: Home / Self Care | Payer: BC Managed Care – PPO | Source: Ambulatory Visit | Attending: Orthopedic Surgery | Admitting: Orthopedic Surgery

## 2011-03-09 DIAGNOSIS — R262 Difficulty in walking, not elsewhere classified: Secondary | ICD-10-CM | POA: Insufficient documentation

## 2011-03-09 DIAGNOSIS — M25569 Pain in unspecified knee: Secondary | ICD-10-CM | POA: Insufficient documentation

## 2011-03-09 DIAGNOSIS — M25669 Stiffness of unspecified knee, not elsewhere classified: Secondary | ICD-10-CM | POA: Insufficient documentation

## 2011-03-09 DIAGNOSIS — M6281 Muscle weakness (generalized): Secondary | ICD-10-CM | POA: Insufficient documentation

## 2011-03-09 DIAGNOSIS — IMO0001 Reserved for inherently not codable concepts without codable children: Secondary | ICD-10-CM | POA: Insufficient documentation

## 2011-03-09 NOTE — Progress Notes (Signed)
Physical Therapy Treatment Patient Details  Name: Michelle Landry MRN: [id number] Date of Birth: [birth year]  Today's Date: 03/09/2011 Time: 0804-0910 Time Calculation (min): 66 min Visit#: 17  of 18   Re-eval: 03/12/11 Charges: therex 40',  estim 10', ice 10'    Subjective: Symptoms/Limitations Symptoms: Pt. states her pain has increased some today; thinks it's due to the rainy weather we've been having.  4/10 today. 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    Exercise/Treatments Aerobic Stationary Bike: 6' 4 holes showing upright Standing Knee Flexion: 20 reps Knee Flexion Limitations: 4# Terminal Knee Extension: 15 reps;Theraband Theraband Level (Terminal Knee Extension): Level 4 (Blue) Lateral Step Up: 15 reps;Step Height: 4" Seated Long Arc Quad: 15 reps Long [employer] Limitations: 2#, 3#, 4# Supine Quad Sets: 20 reps Short Arc [employer] Sets: 15 reps;Limitations Short Arc Quad Sets Limitations: AAROM to reach full ROM Heel Slides: 15 reps Terminal Knee Extension: AAROM;Right;Limitations Terminal Knee Extension Limitations: Russian stim Other Supine Knee Exercises: PROM flex after heelslides Prone  Hamstring Curl: 15 reps Hamstring Curl Limitations: 2#, 3#, 4#   Modalities Modalities: Electrical Stimulation Cryotherapy Number Minutes Cryotherapy: 10 Minutes Cryotherapy Location: Knee Type of Cryotherapy: Ice pack Pharmacologist Location: Russian stim R quadricep with semi-circle roll under knee for TKE Electrical Stimulation Action: Software engineer Parameters: 10/50 duty cycle at 13mA CC for 10 minutes Electrical Stimulation Goals: Strength  Physical Therapy Assessment and Plan PT Assessment and Plan Clinical Impression Statement: Able to achieve greater ROM  with TKE with less current from [place] stimulation.  Slowly improving concentric strength of Right quad. PT Treatment/Interventions: Therapeutic exercise ([place] Stim and [place]) PT Plan: RE-evaluate next visit.     Problem List Patient Active Problem List  Diagnoses  . OSTEOARTHRITIS, KNEES, BILATERAL  . KNEE PAIN  . Muscle weakness (generalized)  . Stiffness of joint, not elsewhere classified, lower leg  . Knee pain, right    PT - End of Session Activity Tolerance: Patient tolerated treatment well General Behavior During Session: [facility] for tasks performed Cognition: [facility] for tasks performed  Emeline Gins B 03/09/2011, 9:04 AM

## 2011-03-10 ENCOUNTER — Ambulatory Visit (HOSPITAL_COMMUNITY)
Admission: RE | Admit: 2011-03-10 | Discharge: 2011-03-10 | Disposition: A | Discharge status: Home / Self Care | Payer: BC Managed Care – PPO | Source: Ambulatory Visit | Attending: Family Medicine | Admitting: Family Medicine

## 2011-03-10 DIAGNOSIS — M6281 Muscle weakness (generalized): Secondary | ICD-10-CM

## 2011-03-10 DIAGNOSIS — M25561 Pain in right knee: Secondary | ICD-10-CM

## 2011-03-10 DIAGNOSIS — M25669 Stiffness of unspecified knee, not elsewhere classified: Secondary | ICD-10-CM

## 2011-03-10 NOTE — Progress Notes (Signed)
Physical Therapy RE-Evaluation  Patient Details  Name: Michelle Landry MRN: [id number] Date of Birth: [birth year]  Today's Date: 03/10/2011 Time: 0803-0908 Time Calculation (min): 65 min Visit#: 18  of 19   Re-eval:  done on 10/3 pt to be discharged.    Past Medical History: No past medical history on file. Past Surgical History: No past surgical history on file.  Subjective Symptoms/Limitations Symptoms: I'm hurting today.  I'm at a 4; thinks it's the weather. How long can you sit comfortably?: Pt is able to sit for hour but it is hurting by then.  Pt was able to sit for five minutes at initial eval.  Pt is still having difficulty getting in and out of the car but it is easier than it was.  How long can you stand comfortably?: Pt is able to stand for hour but is having increased pain at that time.  At inital eval patient was able to stand for ten minutes. How long can you walk comfortably?: Pt states that she is able to walk for 20 minutes was 5 minutes. Pain Assessment Currently in Pain?: Yes Pain Score:   4 Pain Location: Knee Pain Orientation: Right Pain Type: Surgical pain Pain Onset: More than a month ago Pain Relieving Factors: meds Effect of Pain on Daily Activities: increases Multiple Pain Sites: No   Objective: RLE AROM (degrees) Right Knee Extension 0-130: 0  Right Knee Flexion 0-140: 105 was 102 on last re-eval RLE Strength Right Hip Flexion: 5/5 Right Hip Extension: 5/5 was 4/5 at last re-evaluation. Right Hip ABduction: 5/5 Right Hip ADduction: 5/5 Right Knee Flexion: 5/5 was 4/5 at last re-eval. Right Knee Extension: 4/5 (Pt strength was a 3/5. Pt's terminal extension strength continues to be  3-) Right Ankle Dorsiflexion: 5/5 Right Ankle Plantar Flexion: 5/5  Exercise/Treatments Stretches Quad Stretch: 3 reps;30 seconds Aerobic Tread Mill: 6' @1 .0   Seated Long Arc Quad: 15 reps Supine Quad Sets: 15 reps Short Arc Quad Sets: 15 reps Heel Slides:  15 reps Other Supine Knee Exercises: PROM flex after heelslides   Modalities Modalities: Copywriter, advertising Location: Russian stim used to increase terminal extension strength. Electrical Stimulation Action: strengthening  Electrical Stimulation Parameters: 10/10; at 18 mA CC for 12 minutes. Electrical Stimulation Goals: Strength Physical Therapy Assessment and Plan PT Assessment and Plan Clinical Impression Statement: Pt continues to increase in ROM and strength.  Pt is completing a HEP and will continue to work at home for further gains. Rehab Potential: Good Clinical Impairments Affecting Rehab Potential: Pt continues to have swelling affecting ROM as well as strength deficits.   PT Plan: D/C to HEP after Fridays visit secondary to pt's concern of insurance payment.    Goals Home Exercise Program Pt will Perform Home Exercise Program: Independently PT Short Term Goals PT Short Term Goal 1 - Progress: Met PT Short Term Goal 2 - Progress: Met (pain normally is at a 1or 2.) PT Short Term Goal 4 - Progress: Progressing toward goal PT Short Term Goal 5 - Progress: Met PT Long Term Goals PT Long Term Goal 1 - Progress: Met PT Long Term Goal 2 - Progress: Progressing toward goal Long Term Goal 3 Progress: Progressing toward goal Long Term Goal 4 Progress: Met  Problem List Patient Active Problem List  Diagnoses  . OSTEOARTHRITIS, KNEES, BILATERAL  . KNEE PAIN  . Muscle weakness (generalized)  . Stiffness of joint, not elsewhere classified, lower leg  . Knee pain,  right    PT - End of Session Activity Tolerance: Patient tolerated treatment well General Behavior During Session: [facility] for tasks performed   RUSSELL,CINDY 03/10/2011, 9:14 AM  Physician Documentation Your signature is required to indicate approval of the treatment plan as stated above.  Please sign and either send electronically or make a copy of this report for  your files and return this physician signed original.   Please mark one 1.__approve of plan  2. ___approve of plan with the following conditions.   ______________________________                                                          _____________________ Physician Signature                                                                                                             Date

## 2011-03-11 ENCOUNTER — Ambulatory Visit (HOSPITAL_COMMUNITY): Payer: BC Managed Care – PPO

## 2011-03-12 ENCOUNTER — Ambulatory Visit (HOSPITAL_COMMUNITY)
Admission: RE | Admit: 2011-03-12 | Discharge: 2011-03-12 | Disposition: A | Discharge status: Home / Self Care | Payer: BC Managed Care – PPO | Source: Ambulatory Visit

## 2011-03-12 DIAGNOSIS — M6281 Muscle weakness (generalized): Secondary | ICD-10-CM

## 2011-03-12 DIAGNOSIS — M25669 Stiffness of unspecified knee, not elsewhere classified: Secondary | ICD-10-CM

## 2011-03-12 DIAGNOSIS — M25561 Pain in right knee: Secondary | ICD-10-CM

## 2011-03-12 NOTE — Progress Notes (Addendum)
Physical Therapy Treatment Patient Details  Name: Michelle Landry MRN: [id number] Date of Birth: 1955-08-03  Today's Date: 03/12/2011 Time: 7829-5621 Time Calculation (min): 67 min Visit#: 19  of 19   Re-eval:  D/C following this session  Charge: therex: 51 min [place] estim: 10 min  Subjective: Symptoms/Limitations Symptoms: I'm stiff today, have been doing my exercises on that sheet you guys gave me.  Most difficulty I am having currently is the steping down stairs. Pain Assessment Currently in Pain?: Yes Pain Score:   3 Pain Location: Knee Pain Orientation: Right  Objective:  Exercise/Treatments Stretches Active Hamstring Stretch: 3 reps;30 seconds Quad Stretch: 3 reps;30 seconds Aerobic Tread Mill: 6' @ 1.5 Standing Lateral Step Up: 10 reps;2 sets;Step Height: 2";1 set;Step Height: 4" Forward Step Up: 2 sets;10 reps;Step Height: 2" Step Down: 10 reps;1 set Seated Long Arc Quad: 1 set;10 reps;15 reps;20 reps;Weights Long Arc Quad Limitations: 4#x 10, 3# x 15, 2# x 20 Supine Quad Sets: 10 reps Heel Slides: 15 reps Terminal Knee Extension: AAROM;Right;Limitations Terminal Knee Extension Limitations: [place] estim Other Supine Knee Exercises: PROM flex after heelslides Prone  Hamstring Curl: 1 set;10 reps;15 reps;20 reps;Limitations Hamstring Curl Limitations: 4#x 10, 3# x 15, 2# x 20   Modalities Modalities: Archivist Stimulation Location: Russian stim used to increase terminal extension Chemical engineer Action: Software engineer Parameters: 10/50 duty cycle at 13mA CC for 10 minutes Electrical Stimulation Goals: Strength  Physical Therapy Assessment and Plan PT Assessment and Plan Clinical Impression Statement: Pt continues with difficulty with terminal knee extension.  Overall improvement with stairs, pt plans on adding her porch stairs to HEP for further strength  improvments. PT Plan: D/C to HEP.    Goals    Problem List Patient Active Problem List  Diagnoses  . OSTEOARTHRITIS, KNEES, BILATERAL  . KNEE PAIN  . Muscle weakness (generalized)  . Stiffness of joint, not elsewhere classified, lower leg  . Knee pain, right    PT - End of Session Activity Tolerance: Patient tolerated treatment well General Behavior During Session: [facility] for tasks performed Cognition: [facility] for tasks performed  Juel Burrow 03/12/2011, 9:53 AM

## 2011-03-15 ENCOUNTER — Ambulatory Visit (HOSPITAL_COMMUNITY): Payer: BC Managed Care – PPO | Admitting: Physical Therapy

## 2011-03-17 ENCOUNTER — Ambulatory Visit (HOSPITAL_COMMUNITY): Payer: BC Managed Care – PPO | Admitting: Physical Therapy

## 2011-03-19 ENCOUNTER — Ambulatory Visit (HOSPITAL_COMMUNITY): Payer: BC Managed Care – PPO | Admitting: Physical Therapy

## 2011-04-23 ENCOUNTER — Other Ambulatory Visit: Payer: Self-pay | Admitting: Family Medicine

## 2011-04-23 DIAGNOSIS — M8580 Other specified disorders of bone density and structure, unspecified site: Secondary | ICD-10-CM

## 2011-04-28 ENCOUNTER — Ambulatory Visit (HOSPITAL_COMMUNITY)
Admission: RE | Admit: 2011-04-28 | Discharge: 2011-04-28 | Disposition: A | Discharge status: Home / Self Care | Payer: BC Managed Care – PPO | Source: Ambulatory Visit | Attending: Family Medicine | Admitting: Family Medicine

## 2011-04-28 DIAGNOSIS — M949 Disorder of cartilage, unspecified: Secondary | ICD-10-CM | POA: Insufficient documentation

## 2011-04-28 DIAGNOSIS — Z780 Asymptomatic menopausal state: Secondary | ICD-10-CM | POA: Insufficient documentation

## 2011-04-28 DIAGNOSIS — M899 Disorder of bone, unspecified: Secondary | ICD-10-CM | POA: Insufficient documentation

## 2011-04-28 DIAGNOSIS — M8580 Other specified disorders of bone density and structure, unspecified site: Secondary | ICD-10-CM

## 2011-06-22 ENCOUNTER — Other Ambulatory Visit (HOSPITAL_COMMUNITY): Payer: Self-pay | Admitting: Orthopedic Surgery

## 2011-06-22 DIAGNOSIS — M25561 Pain in right knee: Secondary | ICD-10-CM

## 2011-06-25 ENCOUNTER — Encounter (HOSPITAL_COMMUNITY)
Admission: RE | Admit: 2011-06-25 | Discharge: 2011-06-25 | Disposition: A | Discharge status: Home / Self Care | Payer: BC Managed Care – PPO | Source: Ambulatory Visit | Attending: Orthopedic Surgery | Admitting: Orthopedic Surgery

## 2011-06-25 ENCOUNTER — Encounter (HOSPITAL_COMMUNITY): Payer: Self-pay

## 2011-06-25 DIAGNOSIS — M25469 Effusion, unspecified knee: Secondary | ICD-10-CM | POA: Insufficient documentation

## 2011-06-25 DIAGNOSIS — M25569 Pain in unspecified knee: Secondary | ICD-10-CM | POA: Insufficient documentation

## 2011-06-25 DIAGNOSIS — M25561 Pain in right knee: Secondary | ICD-10-CM

## 2011-06-25 DIAGNOSIS — R937 Abnormal findings on diagnostic imaging of other parts of musculoskeletal system: Secondary | ICD-10-CM | POA: Insufficient documentation

## 2011-06-25 DIAGNOSIS — Z96659 Presence of unspecified artificial knee joint: Secondary | ICD-10-CM | POA: Insufficient documentation

## 2011-06-25 HISTORY — DX: Essential (primary) hypertension: I10

## 2011-06-25 MED ORDER — TECHNETIUM TC 99M MEDRONATE IV KIT
25.0000 | PACK | Freq: Once | INTRAVENOUS | Status: AC | PRN
Start: 2011-06-25 — End: 2011-06-25
  Administered 2011-06-25: 26 via INTRAVENOUS

## 2011-08-23 ENCOUNTER — Other Ambulatory Visit: Payer: Self-pay | Admitting: Family Medicine

## 2011-08-23 DIAGNOSIS — Z09 Encounter for follow-up examination after completed treatment for conditions other than malignant neoplasm: Secondary | ICD-10-CM

## 2011-09-08 ENCOUNTER — Ambulatory Visit (HOSPITAL_COMMUNITY)
Admission: RE | Admit: 2011-09-08 | Discharge: 2011-09-08 | Disposition: A | Discharge status: Home / Self Care | Payer: BC Managed Care – PPO | Source: Ambulatory Visit | Attending: Family Medicine | Admitting: Family Medicine

## 2011-09-08 DIAGNOSIS — R928 Other abnormal and inconclusive findings on diagnostic imaging of breast: Secondary | ICD-10-CM | POA: Insufficient documentation

## 2011-09-08 DIAGNOSIS — Z09 Encounter for follow-up examination after completed treatment for conditions other than malignant neoplasm: Secondary | ICD-10-CM | POA: Insufficient documentation

## 2011-11-03 ENCOUNTER — Ambulatory Visit (HOSPITAL_COMMUNITY)
Admission: RE | Admit: 2011-11-03 | Discharge: 2011-11-03 | Disposition: A | Discharge status: Home / Self Care | Payer: BC Managed Care – PPO | Source: Ambulatory Visit | Attending: Orthopedic Surgery | Admitting: Orthopedic Surgery

## 2011-11-03 DIAGNOSIS — R262 Difficulty in walking, not elsewhere classified: Secondary | ICD-10-CM | POA: Insufficient documentation

## 2011-11-03 DIAGNOSIS — IMO0001 Reserved for inherently not codable concepts without codable children: Secondary | ICD-10-CM | POA: Insufficient documentation

## 2011-11-03 DIAGNOSIS — M25569 Pain in unspecified knee: Secondary | ICD-10-CM | POA: Insufficient documentation

## 2011-11-03 DIAGNOSIS — M25669 Stiffness of unspecified knee, not elsewhere classified: Secondary | ICD-10-CM | POA: Insufficient documentation

## 2011-11-03 DIAGNOSIS — M6281 Muscle weakness (generalized): Secondary | ICD-10-CM | POA: Insufficient documentation

## 2011-11-03 NOTE — Evaluation (Signed)
Physical Therapy Evaluation  Patient Details  Name: Michelle Landry MRN: [id number] Date of Birth: [birth year]  Today's Date: 11/03/2011 Time: 1027-2536 PT Time Calculation (min): 44 min  Visit#: 1  of 8   Re-eval: 12/03/11 Assessment Diagnosis: R TKR Surgical Date: 12/23/11 Next MD Visit: 11/23/2011 Prior Therapy: OP   Past Medical History:  Past Medical History  Diagnosis Date  . Hypertension    Past Surgical History: No past surgical history on file.  Subjective Symptoms/Limitations Symptoms: Ms Peace had a TKR on 12/23/2010.  She had therapy at this facility from 01/26/2011 until 03/12/2011 for 19 visits at which time she was discharged to a HEP.  The patient states that she nerver stopped hurting.  The patient states that she started having more pain in December.  After tests she realized that she had a stress fracture in her femur.  She was placed on limited activity for eight weeks.  When she started to increase her activity her pain level increased again and she began to have increased swelling.  She states that she does not feel her balance is as it should be anymore as well.  She is now being referred to PT to improve her funtional ability.   PMH includes L THR in Feb. of 2012   How long can you sit comfortably?: The patient states if she sits for greater than an hour she starts having increased swelling and pain. How long can you stand comfortably?: The patient states that she is only able to stand for 20 minutes before she has to sit down do pain and fatigue. How long can you walk comfortably?: The patient states if she is on cement she has immediate pain with standing and walking she is only able to walk for ten to fifteen minutes. Special Tests: The patient states that she is waking up between four and five times a night. Pain Assessment Currently in Pain?: Yes Pain Score:   2 (worst pain in the past week has been a 4 or 5) Pain Location: Knee Pain Orientation:  Right Pain Type: Chronic pain Pain Onset: More than a month ago Pain Frequency: Constant Pain Relieving Factors: Motrin Effect of Pain on Daily Activities: The more activity the patient does the more pain she has.    Prior Functioning  Home Living Lives With: Family Type of Home: House Home Access: Stairs to enter [employer] of Steps: 4 (unable to go up and down reciprocally) Entrance Stairs-Rails: Right Home Layout: One level Prior Function Able to Take Stairs?: Reciprically Vocation: On disability Leisure: Hobbies-yes (Comment) Comments: working in yard, shop  Cognition/Observation Cognition Overall Cognitive Status: Appears within functional limits for tasks assessed  Sensation/Coordination/Flexibility/Functional Tests Functional Tests Functional Tests: LEFS 38/64-took running and hopping activites out  Assessment RLE AROM (degrees) Right Knee Extension 0-130: 0  Right Knee Flexion 0-140: 95  RLE Strength Right Hip Flexion: 5/5 Right Hip Extension: 5/5 Right Hip ABduction: 5/5 Right Hip ADduction: 5/5 Right Knee Flexion: 5/5 Right Knee Extension: 3+/5 Right Ankle Dorsiflexion: 3/5  Exercise/Treatments Mobility/Balance  Static Standing Balance Single Leg Stance - Right Leg: 7  Single Leg Stance - Left Leg: 13  Tandem Stance - Right Leg: 60  Tandem Stance - Left Leg: 26  [place]homberg - Eyes Closed: 60    Sitting  LAQ x 10 Supine Quad Sets: 10 reps Terminal Knee Extension: 10 reps    Physical Therapy Assessment and Plan PT Assessment and Plan Clinical Impression Statement: Pt with decreased  quadricep strength in all angles.  Will need skilled physcial therapy to return pt to maximal functional level.  Treatment will concentrate on strength and balance Rehab Potential: Good PT Frequency: Min 2X/week PT Duration: 4 weeks PT Treatment/Interventions: Therapeutic activities;Therapeutic exercise PT Plan: begin SLS, terminal standing extension, R heel  raise, forward  and side lunge, wall squats next treatment.  Pt will need ex sheet.  Third treatment add warrior I,II, and III    Goals Home Exercise Program Pt will Perform Home Exercise Program: Independently PT Short Term Goals Time to Complete Short Term Goals: 2 weeks PT Short Term Goal 1: Pt able to sit for an hour with comfort PT Short Term Goal 2: Pt able to stand for 30 minutes. PT Short Term Goal 3: Pt to be able to walk for thirty minutes PT Long Term Goals Time to Complete Long Term Goals: 4 weeks PT Long Term Goal 1: Pt LEFS to improve by at least 10 points PT Long Term Goal 2: Pt pain to be no greater than a 2 Long Term Goal 3: Pt to be able to sit for two hours for traveling or watching a movie Long Term Goal 4: Pt to be able to stand for forty minutes to make an involved meal PT Long Term Goal 5: Pt  to be able to walk for forty five minutes to be able to go shopping.  Problem List Patient Active Problem List  Diagnoses  . OSTEOARTHRITIS, KNEES, BILATERAL  . KNEE PAIN  . Muscle weakness (generalized)  . Stiffness of joint, not elsewhere classified, lower leg  . Knee pain, right  . Quadriceps Weakness  . Difficulty in walking    PT - End of Session Activity Tolerance: Patient tolerated treatment well General Behavior During Session: [facility] for tasks performed Cognition: [facility] for tasks performed PT Plan of Care PT Home Exercise Plan: given Consulted and Agree with Plan of Care: Patient    Elleah Hemsley,CINDY 11/03/2011, 12:37 PM  Physician Documentation Your signature is required to indicate approval of the treatment plan as stated above.  Please sign and either send electronically or make a copy of this report for your files and return this physician signed original.   Please mark one 1.__approve of plan  2. ___approve of plan with the following conditions.   ______________________________                                                           _____________________ Physician Signature                                                                                                             Date

## 2011-11-09 ENCOUNTER — Ambulatory Visit (HOSPITAL_COMMUNITY)
Admission: RE | Admit: 2011-11-09 | Discharge: 2011-11-09 | Disposition: A | Discharge status: Home / Self Care | Payer: BC Managed Care – PPO | Source: Ambulatory Visit | Attending: Family Medicine | Admitting: Family Medicine

## 2011-11-09 DIAGNOSIS — M25669 Stiffness of unspecified knee, not elsewhere classified: Secondary | ICD-10-CM | POA: Insufficient documentation

## 2011-11-09 DIAGNOSIS — R262 Difficulty in walking, not elsewhere classified: Secondary | ICD-10-CM | POA: Insufficient documentation

## 2011-11-09 DIAGNOSIS — M6281 Muscle weakness (generalized): Secondary | ICD-10-CM

## 2011-11-09 DIAGNOSIS — M25569 Pain in unspecified knee: Secondary | ICD-10-CM | POA: Insufficient documentation

## 2011-11-09 DIAGNOSIS — IMO0001 Reserved for inherently not codable concepts without codable children: Secondary | ICD-10-CM | POA: Insufficient documentation

## 2011-11-09 NOTE — Progress Notes (Signed)
Physical Therapy Treatment Patient Details  Name: Novelle Addair MRN: [id number] Date of Birth: [birth year]  Today's Date: 11/09/2011 Time:  -     Visit#: 2  of 8   Re-eval: 12/03/11    Authorization:    Authorization Time Period:    Authorization Visit#:   of     Subjective: Symptoms/Limitations Symptoms: Pt states she is sore from exercises Pain Assessment Currently in Pain?: Yes Pain Score:   3 Pain Location: Knee Pain Orientation: Right    Exercise/Treatments    Aerobic Stationary Bike: 4.0 x 8.0    Standing Heel Raises: 15 reps;Limitations Heel Raises Limitations: r only Forward Lunges: 15 reps Side Lunges: 15 reps Terminal Knee Extension: Right;15 reps Functional Squat: 15 reps SLS:  (3x 30 sec.)   Supine Terminal Knee Extension: 15 reps   Prone  Contract/Relax to Increase Flexion:  (x5) Other Prone Exercises: Jt distraction with PROM      Physical Therapy Assessment and Plan PT Assessment and Plan Clinical Impression Statement: Added closed chain ex for strengthening with both verbal and physical cues for proper technique and form. PT Plan: begin warrior I, II, and III exercises as well as attempting to squat to tall kneel on foam       Problem List Patient Active Problem List  Diagnoses  . OSTEOARTHRITIS, KNEES, BILATERAL  . KNEE PAIN  . Muscle weakness (generalized)  . Stiffness of joint, not elsewhere classified, lower leg  . Knee pain, right  . Quadriceps Weakness  . Difficulty in walking    PT - End of Session Activity Tolerance: Patient tolerated treatment well General Behavior During Session: [facility] for tasks performed Cognition: [facility] for tasks performed  GP No functional reporting required  Burlie Cajamarca,CINDY 11/09/2011, 11:07 AM

## 2011-11-11 ENCOUNTER — Ambulatory Visit (HOSPITAL_COMMUNITY)
Admission: RE | Admit: 2011-11-11 | Discharge: 2011-11-11 | Disposition: A | Discharge status: Home / Self Care | Payer: BC Managed Care – PPO | Source: Ambulatory Visit | Attending: Family Medicine | Admitting: Family Medicine

## 2011-11-11 NOTE — Progress Notes (Signed)
Physical Therapy Treatment Patient Details  Name: Devinne Epstein MRN: [id number] Date of Birth: 1956-03-19  Today's Date: 11/11/2011 Time: 1026-1111 PT Time Calculation (min): 45 min  Charges: 91' therex  Visit#: 3  of 8   Re-eval: 12/03/11     Subjective: Symptoms/Limitations Symptoms: Patient c/o of R knee stiffness and rates pain 3/10 Pain Assessment Currently in Pain?: Yes Pain Score:   3 Pain Location: Knee Pain Orientation: Right  Precautions/Restrictions     Exercise/Treatments    Aerobic Stationary Bike: 8 x6";3 rest   Standing Heel Raises: 15 reps;Limitations Heel Raises Limitations: r only Forward Lunges: 15 reps Functional Squat: 15 reps SLS: 3x 30" Other Standing Knee Exercises: warrior I/II 3x20" Prone  Contract/Relax to Increase Flexion: x5 Other Prone Exercises: Jt distraction with PROM      Physical Therapy Assessment and Plan PT Assessment and Plan Clinical Impression Statement: added warrior I/II patient did well ; attempted squat tall to foam, patient unable to stand back up on own. PT Plan: add warrior III    Goals    Problem List Patient Active Problem List  Diagnoses  . OSTEOARTHRITIS, KNEES, BILATERAL  . KNEE PAIN  . Muscle weakness (generalized)  . Stiffness of joint, not elsewhere classified, lower leg  . Knee pain, right  . Quadriceps Weakness  . Difficulty in walking    PT - End of Session Activity Tolerance: Patient tolerated treatment well General Behavior During Session: [facility] for tasks performed Cognition: [facility] for tasks performed  GP No functional reporting required  Kyrus Hyde ATKINSO 11/11/2011, 11:15 AM

## 2011-11-15 ENCOUNTER — Ambulatory Visit (HOSPITAL_COMMUNITY)
Admission: RE | Admit: 2011-11-15 | Discharge: 2011-11-15 | Disposition: A | Discharge status: Home / Self Care | Payer: BC Managed Care – PPO | Source: Ambulatory Visit | Attending: Family Medicine | Admitting: Family Medicine

## 2011-11-15 NOTE — Progress Notes (Signed)
Physical Therapy Treatment Patient Details  Name: Michelle Landry MRN: [id number] Date of Birth: [birth year]  Today's Date: 11/15/2011 Time: 7829-5621 PT Time Calculation (min): 39 min  Visit#: 4  of 8   Re-eval: 12/03/11    Authorization:    Authorization Time Period:    Authorization Visit#:   of     Subjective: Symptoms/Limitations Symptoms: Patient rating R knee pain 3 to4/10 today. Stating that it's because of the weather  Precautions/Restrictions     Exercise/Treatments Mobility/Balance        Stretches   Aerobic Stationary Bike: set 8; 6' at 2.5 res Machines for Strengthening   Plyometrics   Standing Heel Raises: 15 reps;Limitations Heel Raises Limitations: r only Forward Lunges: 2 sets;10 reps Side Lunges: 2 sets;10 reps Forward Step Up:  (w/ HH) Functional Squat: 2 sets;10 reps SLS: 3x 30" SLS with Vectors: R only Other Standing Knee Exercises: warrior I/II 3x20" Seated   Supine   Sidelying   Prone  Contract/Relax to Increase Flexion: x8 Other Prone Exercises: Jt distraction with PROM      Physical Therapy Assessment and Plan PT Assessment and Plan Clinical Impression Statement: added forward step ups due to patient c/o using stairs gives her the most trouble. PT Plan: add warrior III and increaseROM   Goals    Problem List Patient Active Problem List  Diagnoses  . OSTEOARTHRITIS, KNEES, BILATERAL  . KNEE PAIN  . Muscle weakness (generalized)  . Stiffness of joint, not elsewhere classified, lower leg  . Knee pain, right  . Quadriceps Weakness  . Difficulty in walking    PT - End of Session Activity Tolerance: Patient tolerated treatment well General Behavior During Session: [facility] for tasks performed Cognition: [facility] for tasks performed  GP No functional reporting required  Mandy Peeks ATKINSO 11/15/2011, 1:34 PM

## 2011-11-17 ENCOUNTER — Ambulatory Visit (HOSPITAL_COMMUNITY)
Admission: RE | Admit: 2011-11-17 | Discharge: 2011-11-17 | Disposition: A | Discharge status: Home / Self Care | Payer: BC Managed Care – PPO | Source: Ambulatory Visit | Attending: Family Medicine | Admitting: Family Medicine

## 2011-11-17 NOTE — Progress Notes (Signed)
Physical Therapy Treatment Patient Details  Name: Michelle Landry MRN: [id number] Date of Birth: 05-20-1956  Today's Date: 11/17/2011 Time: 4098-1191 PT Time Calculation (min): 37 min 37' Therex  Visit#: 5  of 8   Re-eval: 12/03/11    Authorization:    Authorization Time Period:    Authorization Visit#:   of     Subjective: Symptoms/Limitations Symptoms: Still c/o of stiffness this morning but no pain.  Precautions/Restrictions     Exercise/Treatments Mobility/Balance        Stretches   Aerobic Stationary Bike: set 8; 6' at 2.5 res Machines for Strengthening   Plyometrics   Standing Heel Raises: 2 sets;10 reps Heel Raises Limitations: r only Forward Lunges: 2 sets;10 reps Side Lunges: 2 sets;10 reps Forward Step Up: Step Height: 4";15 reps;Limitations (w/HH) Functional Squat: 2 sets;10 reps SLS: 3x 30" SLS with Vectors: R only Other Standing Knee Exercises: warrior II/III 3x20' Seated   Supine   Sidelying   Prone  Contract/Relax to Increase Flexion: x8 Other Prone Exercises: Jt distraction with PROM      Physical Therapy Assessment and Plan PT Assessment and Plan Clinical Impression Statement: Patient continues to do well with added reps and exercises. Focus on increasing knee flexion next session    Goals    Problem List Patient Active Problem List  Diagnosis  . OSTEOARTHRITIS, KNEES, BILATERAL  . KNEE PAIN  . Muscle weakness (generalized)  . Stiffness of joint, not elsewhere classified, lower leg  . Knee pain, right  . Quadriceps Weakness  . Difficulty in walking       GP No functional reporting required  Othmar Ringer ATKINSO 11/17/2011, 10:15 AM

## 2011-11-24 ENCOUNTER — Ambulatory Visit (HOSPITAL_COMMUNITY)
Admission: RE | Admit: 2011-11-24 | Discharge: 2011-11-24 | Disposition: A | Discharge status: Home / Self Care | Payer: BC Managed Care – PPO | Source: Ambulatory Visit | Attending: Family Medicine | Admitting: Family Medicine

## 2011-11-24 NOTE — Progress Notes (Signed)
Physical Therapy Treatment Patient Details  Name: Michelle Landry MRN: [id number] Date of Birth: Apr 20, 1956  Today's Date: 11/24/2011 Time: 4098-1191 PT Time Calculation (min): 44 min Visit#: 6  of 8   Re-eval: 12/03/11 Charges:  therex 38'    Subjective: Symptoms/Limitations Symptoms: Pt. reports R knee and L hip soreness today.  Rates pain 3/10. Pain Assessment Currently in Pain?: Yes Pain Score:   3 Pain Location: Knee Pain Orientation: Right   Exercise/Treatments Aerobic Stationary Bike: 8' @ 3.0, seat 8 Standing Heel Raises: 2 sets;10 reps Heel Raises Limitations: R only Forward Lunges: 2 sets;10 reps Side Lunges: 2 sets;10 reps Lateral Step Up: Step Height: 4";15 reps;Hand Hold: 1 Forward Step Up: Step Height: 4";15 reps;Hand Hold: 1 Functional Squat: 20 reps SLS: 22" max of 3 R only SLS with Vectors: R only 5X5" Other Standing Knee Exercises: warrior II 3X 30"/III 3x20' Prone  Contract/Relax to Increase Flexion: 5 X Other Prone Exercises: Jt distraction with PROM X 5     Physical Therapy Assessment and Plan PT Assessment and Plan Clinical Impression Statement: Pt. unable to lift toe from floor with Warrior III activity, however increased SLS time without UE's today.  Pt. with diffiuculty relaxing hamstring and glutes with PROM for flexion. PT Plan: Continue to increase stability and ROM of R knee.  Re-evaluate this week.     Problem List Patient Active Problem List  Diagnosis  . OSTEOARTHRITIS, KNEES, BILATERAL  . KNEE PAIN  . Muscle weakness (generalized)  . Stiffness of joint, not elsewhere classified, lower leg  . Knee pain, right  . Quadriceps Weakness  . Difficulty in walking    PT - End of Session Activity Tolerance: Patient tolerated treatment well General Behavior During Session: [facility] for tasks performed Cognition: [facility] for tasks performed  GP No functional reporting required  Lurena Nida, PTA/CLT 11/24/2011, 10:18 AM

## 2011-11-26 ENCOUNTER — Ambulatory Visit (HOSPITAL_COMMUNITY)
Admission: RE | Admit: 2011-11-26 | Discharge: 2011-11-26 | Disposition: A | Discharge status: Home / Self Care | Payer: BC Managed Care – PPO | Source: Ambulatory Visit | Attending: Family Medicine | Admitting: Family Medicine

## 2011-11-26 DIAGNOSIS — M6281 Muscle weakness (generalized): Secondary | ICD-10-CM

## 2011-11-26 NOTE — Progress Notes (Signed)
Physical Therapy Treatment Patient Details  Name: Michelle Landry MRN: [id number] Date of Birth: 02-04-1956  Today's Date: 11/26/2011 Time: 1105-1155 PT Time Calculation (min): 50 min  Visit#: 7  of 8   Re-eval: 12/03/11    Authorization:    Authorization Time Period:    Authorization Visit#:   of     Subjective: Symptoms/Limitations Symptoms: Pt feeling better today. States steps are the hardest thing to do at this point Pain Assessment Pain Score:   1 Pain Location: Knee Pain Orientation: Right Pain Type: Chronic pain    Exercise/Treatments     Stretches Quad Stretch: 3 reps;30 seconds Aerobic Stationary Bike: 8 @ 4.0 seat 8   Standing Forward Lunges: 15 reps Terminal Knee Extension: Right;15 reps Functional Squat: 10 reps;15 reps (R only 1/4 ) Wall Squat: 10 reps SLS with Vectors:  (R only 5" hold x 3) Other Standing Knee Exercises: warrior I/II x 1 min @ x 3 Other Standing Knee Exercises: steps 2 Rt Seated Long Arc Quad: Right;15 reps;Weights Long Arc Quad Weight: 5 lbs. Supine Short Arc Quad Sets: Strengthening;Right;15 reps;Limitations Water quality scientist Limitations: 5# Terminal Knee Extension: Right;15 reps   Prone  Contract/Relax to Increase Flexion:  (5 x) Other Prone Exercises: Jt distraction to increase flexion    Physical Therapy Assessment and Plan PT Assessment and Plan Clinical Impression Statement: Pt has most difficutly with terminal extension. Will keep terminal standing and supine activity in program.  Pt able to go up and down steps with one hand hold reciprocally although with apprehension.  Added steps, wall squats and LAQ to program. Rehab Potential: Good PT Plan: Pt to be re-evaled on 6/28 per initial evaluation.    Goals    Problem List Patient Active Problem List  Diagnosis  . OSTEOARTHRITIS, KNEES, BILATERAL  . KNEE PAIN  . Muscle weakness (generalized)  . Stiffness of joint, not elsewhere classified, lower leg  . Knee  pain, right  . Quadriceps Weakness  . Difficulty in walking    PT - End of Session Activity Tolerance: Patient tolerated treatment well General Behavior During Session: [facility] for tasks performed Cognition: [facility] for tasks performed  GP No functional reporting required  Gus Littler,CINDY 11/26/2011, 11:58 AM

## 2011-11-30 ENCOUNTER — Ambulatory Visit (HOSPITAL_COMMUNITY)
Admission: RE | Admit: 2011-11-30 | Discharge: 2011-11-30 | Disposition: A | Discharge status: Home / Self Care | Payer: BC Managed Care – PPO | Source: Ambulatory Visit | Attending: Family Medicine | Admitting: Family Medicine

## 2011-11-30 NOTE — Progress Notes (Signed)
Physical Therapy Treatment Patient Details  Name: Michelle Landry MRN: [id number] Date of Birth: 17-Dec-1955  Today's Date: 11/30/2011 Time: 4098-1191 PT Time Calculation (min): 54 min  Visit#: 8  of 8   Re-eval: 12/03/11 Charges: Therex x 40' Ice x 10'   Subjective: Symptoms/Limitations Symptoms: "No pain, just a little bit of stiffness" Pain Assessment Currently in Pain?: No/denies   Exercise/Treatments Aerobic Stationary Bike: 8 @ 4.0 seat 8 Standing Forward Lunges: 15 reps Functional Squat: 10 reps;Limitations Functional Squat Limitations: R only 1/4 Wall Squat: 10 reps SLS with Vectors: R only 5x5" Other Standing Knee Exercises: warrior I/II x 1 min @ x 3 Other Standing Knee Exercises: steps 2 Rt Seated Long Arc Quad: Right;15 reps;Weights Long Arc Quad Weight: 5 lbs. Supine Short Arc Quad Sets: 15 reps;Limitations;Right;Strengthening Short Arc Quad Sets Limitations: 5#   Cryotherapy Number Minutes Cryotherapy: 10 Minutes Cryotherapy Location: Knee (Right) Type of Cryotherapy: Ice pack  Physical Therapy Assessment and Plan PT Assessment and Plan Clinical Impression Statement: Pt requires vc's to control knee ext with exercises. Pt has visible quad fatigue with long and short arc quads. Pt presents with pitting edema around R knee joint. Ice applied to R knee at end of session limit swelling. Pt reports no increase in pain and a decrease in stiffness at end of session. PT Plan: Reassess next session.     Problem List Patient Active Problem List  Diagnosis  . OSTEOARTHRITIS, KNEES, BILATERAL  . KNEE PAIN  . Muscle weakness (generalized)  . Stiffness of joint, not elsewhere classified, lower leg  . Knee pain, right  . Quadriceps Weakness  . Difficulty in walking    PT - End of Session Activity Tolerance: Patient tolerated treatment well General Behavior During Session: [facility] for tasks performed Cognition: [facility] for tasks performed   Seth Bake,  PTA 11/30/2011, 11:50 AM

## 2011-12-02 ENCOUNTER — Ambulatory Visit (HOSPITAL_COMMUNITY)
Admission: RE | Admit: 2011-12-02 | Discharge: 2011-12-02 | Disposition: A | Discharge status: Home / Self Care | Payer: BC Managed Care – PPO | Source: Ambulatory Visit | Attending: Family Medicine | Admitting: Family Medicine

## 2011-12-02 DIAGNOSIS — M6281 Muscle weakness (generalized): Secondary | ICD-10-CM

## 2011-12-02 NOTE — Evaluation (Signed)
Physical Therapy Evaluation  Patient Details  Name: Michelle Landry MRN: [id number] Date of Birth: 01-Jul-1955  Today's Date: 12/02/2011 Time: 0930-1010 PT Time Calculation (min): 40 min  Visit#: 9  of 8   Re-eval: 12/02/11 Assessment Diagnosis: R TKR Surgical Date: 12/23/11 Next MD Visit: 11/23/2011 Prior Therapy: OP   Past Medical History:  Past Medical History  Diagnosis Date  . Hypertension    Past Surgical History: No past surgical history on file.  Subjective Symptoms/Limitations Symptoms: Doing her exercises at least once a day. How long can you sit comfortably?: The patient is able to sit for an hour which is the same. How long can you stand comfortably?: the patient states she is able to stand for 30 minutes was 20 minutes. How long can you walk comfortably?: The patient has walked for an hour where she was only walking for 20 minutes before. Pain Assessment Currently in Pain?: Yes Pain Score:   2 Pain Orientation: Right Pain Type: Chronic pain   Prior Functioning  Home Living Lives With: Family Type of Home: House Home Access: Stairs to enter [employer] of Steps: 4 (unable to go up and down reciprocally) Entrance Stairs-Rails: Right Home Layout: One level Prior Function Able to Take Stairs?: Reciprically Vocation: On disability Leisure: Hobbies-yes (Comment)  Cognition/Observation Cognition Overall Cognitive Status: Appears within functional limits for tasks assessed  Sensation/Coordination/Flexibility/Functional Tests Functional Tests Functional Tests: LEFS 49/64 was 38  Assessment RLE AROM (degrees) Right Knee Extension: 0  Right Knee Flexion: 108  (was 95) RLE Strength Right Hip Flexion: 5/5 Right Hip Extension: 5/5 Right Hip ABduction: 5/5 Right Hip ADduction: 5/5 Right Knee Flexion: 5/5 Right Knee Extension: 5/5 (was 3+) Right Ankle Dorsiflexion: 5/5 (was 3/5)  Exercise/Treatments    Exercise for balance and ROM    Mobility/Balance  Static Standing Balance Single Leg Stance - Right Leg: 60  (7) Single Leg Stance - Left Leg: 17  (13) Tandem Stance - Right Leg: 60  Tandem Stance - Left Leg: 60  (was 36) [place]omberg - Eyes Closed: 60       Physical Therapy Assessment and Plan PT Assessment and Plan Clinical Impression Statement: Pt has progressed well with strength and function.  Pt no longer in need of skilled care will D/C to [facility] PT Plan: discharge pt    Goals Home Exercise Program PT Goal: Perform Home Exercise Program - Progress: Met PT Short Term Goals PT Short Term Goal 1 - Progress: Met PT Short Term Goal 2 - Progress: Met PT Short Term Goal 3 - Progress: Met PT Long Term Goals PT Long Term Goal 1: LEFS was 11 points. PT Long Term Goal 1 - Progress: Met PT Long Term Goal 2 - Progress: Met Long Term Goal 3: Pt is still limited to an hour of sitting with comfort.  She can sit longer but it becomes uncomfortable. Long Term Goal 3 Progress: Progressing toward goal Long Term Goal 4: Pt is able to stand for 30 minutes Long Term Goal 4 Progress: Progressing toward goal PT Long Term Goal 5: Pt is able to walk for an hour Long Term Goal 5 Progress: Met  Problem List Patient Active Problem List  Diagnosis  . OSTEOARTHRITIS, KNEES, BILATERAL  . KNEE PAIN  . Muscle weakness (generalized)  . Stiffness of joint, not elsewhere classified, lower leg  . Knee pain, right  . Quadriceps Weakness  . Difficulty in walking    PT - End of Session Activity Tolerance: Patient  tolerated treatment well General Behavior During Session: [facility] for tasks performed Cognition: [facility] for tasks performed PT Plan of Care PT Home Exercise Plan: Given for balance, walking and ROM PT Patient Instructions: walking and balance are lifetime healthy habits. Consulted and Agree with Plan of Care: Patient  GP    Janera Peugh,CINDY 12/02/2011, 10:20 AM  Physician Documentation Your signature is required to indicate  approval of the treatment plan as stated above.  Please sign and either send electronically or make a copy of this report for your files and return this physician signed original.   Please mark one 1.__approve of plan  2. ___approve of plan with the following conditions.   ______________________________                                                          _____________________ Physician Signature                                                                                                             Date

## 2012-01-06 ENCOUNTER — Other Ambulatory Visit (HOSPITAL_COMMUNITY): Payer: Self-pay | Admitting: Orthopedic Surgery

## 2012-01-06 DIAGNOSIS — M545 Low back pain, unspecified: Secondary | ICD-10-CM

## 2012-01-11 ENCOUNTER — Ambulatory Visit (HOSPITAL_COMMUNITY)
Admission: RE | Admit: 2012-01-11 | Discharge: 2012-01-11 | Disposition: A | Discharge status: Home / Self Care | Payer: BC Managed Care – PPO | Source: Ambulatory Visit | Attending: Orthopedic Surgery | Admitting: Orthopedic Surgery

## 2012-01-11 DIAGNOSIS — M545 Low back pain, unspecified: Secondary | ICD-10-CM | POA: Insufficient documentation

## 2012-01-11 DIAGNOSIS — M5137 Other intervertebral disc degeneration, lumbosacral region: Secondary | ICD-10-CM | POA: Insufficient documentation

## 2012-01-11 DIAGNOSIS — M5126 Other intervertebral disc displacement, lumbar region: Secondary | ICD-10-CM | POA: Insufficient documentation

## 2012-01-11 DIAGNOSIS — M51379 Other intervertebral disc degeneration, lumbosacral region without mention of lumbar back pain or lower extremity pain: Secondary | ICD-10-CM | POA: Insufficient documentation

## 2012-08-01 ENCOUNTER — Other Ambulatory Visit (HOSPITAL_COMMUNITY): Payer: Self-pay | Admitting: Family Medicine

## 2012-08-01 DIAGNOSIS — Z09 Encounter for follow-up examination after completed treatment for conditions other than malignant neoplasm: Secondary | ICD-10-CM

## 2012-08-01 DIAGNOSIS — Z139 Encounter for screening, unspecified: Secondary | ICD-10-CM

## 2012-09-20 ENCOUNTER — Ambulatory Visit (HOSPITAL_COMMUNITY)
Admission: RE | Admit: 2012-09-20 | Discharge: 2012-09-20 | Disposition: A | Discharge status: Home / Self Care | Payer: BC Managed Care – PPO | Source: Ambulatory Visit | Attending: Family Medicine | Admitting: Family Medicine

## 2012-09-20 DIAGNOSIS — Z09 Encounter for follow-up examination after completed treatment for conditions other than malignant neoplasm: Secondary | ICD-10-CM

## 2012-09-20 DIAGNOSIS — N63 Unspecified lump in unspecified breast: Secondary | ICD-10-CM | POA: Insufficient documentation

## 2012-11-27 ENCOUNTER — Other Ambulatory Visit: Payer: Self-pay | Admitting: Nurse Practitioner

## 2012-11-27 NOTE — Telephone Encounter (Signed)
Needs office visit.

## 2012-11-30 ENCOUNTER — Encounter: Payer: Self-pay | Admitting: *Deleted

## 2012-12-01 ENCOUNTER — Ambulatory Visit (INDEPENDENT_AMBULATORY_CARE_PROVIDER_SITE_OTHER): Payer: BC Managed Care – PPO | Admitting: Nurse Practitioner

## 2012-12-01 ENCOUNTER — Encounter: Payer: Self-pay | Admitting: Nurse Practitioner

## 2012-12-01 VITALS — BP 120/72 | HR 70 | Ht 65.5 in | Wt 163.0 lb

## 2012-12-01 DIAGNOSIS — M810 Age-related osteoporosis without current pathological fracture: Secondary | ICD-10-CM

## 2012-12-01 DIAGNOSIS — E876 Hypokalemia: Secondary | ICD-10-CM

## 2012-12-01 DIAGNOSIS — F418 Other specified anxiety disorders: Secondary | ICD-10-CM

## 2012-12-01 DIAGNOSIS — I10 Essential (primary) hypertension: Secondary | ICD-10-CM

## 2012-12-01 DIAGNOSIS — F341 Dysthymic disorder: Secondary | ICD-10-CM

## 2012-12-01 MED ORDER — POTASSIUM CHLORIDE ER 10 MEQ PO TBCR: 10.0000 meq | EXTENDED_RELEASE_TABLET | Freq: Every day | ORAL | Status: DC

## 2012-12-01 MED ORDER — CITALOPRAM HYDROBROMIDE 20 MG PO TABS: ORAL_TABLET | ORAL | Status: DC

## 2012-12-01 MED ORDER — INDAPAMIDE 1.25 MG PO TABS: 1.2500 mg | ORAL_TABLET | Freq: Every day | ORAL | Status: DC

## 2012-12-01 MED ORDER — IBANDRONATE SODIUM 150 MG PO TABS: 150.0000 mg | ORAL_TABLET | ORAL | Status: AC

## 2012-12-01 MED ORDER — LISINOPRIL 20 MG PO TABS: 20.0000 mg | ORAL_TABLET | Freq: Every day | ORAL | Status: DC

## 2012-12-03 ENCOUNTER — Encounter: Payer: Self-pay | Admitting: Nurse Practitioner

## 2012-12-03 DIAGNOSIS — F418 Other specified anxiety disorders: Secondary | ICD-10-CM | POA: Insufficient documentation

## 2012-12-03 DIAGNOSIS — I10 Essential (primary) hypertension: Secondary | ICD-10-CM | POA: Insufficient documentation

## 2012-12-03 DIAGNOSIS — M810 Age-related osteoporosis without current pathological fracture: Secondary | ICD-10-CM | POA: Insufficient documentation

## 2012-12-03 NOTE — Assessment & Plan Note (Signed)
Increase Celexa 20 mg to one and a half tabs by mouth daily.

## 2012-12-03 NOTE — Assessment & Plan Note (Signed)
Stable. Continue lisinopril and Lozol. Recheck in 6 months. Routine labs at that time.

## 2012-12-03 NOTE — Assessment & Plan Note (Signed)
Continue Boniva as directed. Continue calcium and vitamin D supplementation.

## 2012-12-03 NOTE — Progress Notes (Signed)
Subjective:  Presents for routine followup. Has felt a little more depressed lately. Would like to increase her dose of Celexa. Has a history of hypokalemia. Is not currently on her potassium supplement. No chest pain shortness of breath or edema. Compliant with medications. Taking Boniva without difficulty. Also on daily calcium and vitamin D supplementation.  Objective:   BP 120/72  Pulse 70  Wt 163 lb (73.936 kg) NAD. Alert, oriented. Lungs clear. Heart regular rate rhythm. Lower extremities no edema.  Assessment:Osteoporosis, unspecified  Hypertension  Hypokalemia  Depression with anxiety  Plan: Meds ordered this encounter  Medications  . potassium chloride (K-DUR) 10 MEQ tablet    Sig: Take 1 tablet (10 mEq total) by mouth daily.    Dispense:  90 tablet    Refill:  1    Order Specific Question:  Supervising Provider    Answer:  Merlyn Albert [[id number]]  . citalopram (CELEXA) 20 MG tablet    Sig: Take 1 1/2 tabs po qhs    Dispense:  135 tablet    Refill:  5    Order Specific Question:  Supervising Provider    Answer:  Merlyn Albert [[id number]]  . ibandronate (BONIVA) 150 MG tablet    Sig: Take 1 tablet (150 mg total) by mouth every 30 (thirty) days. Take in the morning with a full glass of water, on an empty stomach, and do not take anything else by mouth or lie down for the next 30 min.    Dispense:  1 tablet    Refill:  5    Order Specific Question:  Supervising Provider    Answer:  Merlyn Albert [[id number]]  . indapamide (LOZOL) 1.25 MG tablet    Sig: Take 1 tablet (1.25 mg total) by mouth daily.    Dispense:  90 tablet    Refill:  1    Order Specific Question:  Supervising Provider    Answer:  Merlyn Albert [[id number]]  . lisinopril (PRINIVIL,ZESTRIL) 20 MG tablet    Sig: Take 1 tablet (20 mg total) by mouth daily.    Dispense:  90 tablet    Refill:  1    Order Specific Question:  Supervising Provider    Answer:  Merlyn Albert [[id number]]   Call back in 3-4 weeks  if no improvement in her depression symptoms. Otherwise followup in 6 months for routine lab work and wellness checkup.

## 2013-02-26 ENCOUNTER — Other Ambulatory Visit (HOSPITAL_COMMUNITY): Payer: Self-pay | Admitting: Family Medicine

## 2013-04-23 ENCOUNTER — Ambulatory Visit (INDEPENDENT_AMBULATORY_CARE_PROVIDER_SITE_OTHER): Payer: BC Managed Care – PPO | Admitting: Nurse Practitioner

## 2013-04-23 ENCOUNTER — Encounter: Payer: Self-pay | Admitting: Nurse Practitioner

## 2013-04-23 VITALS — BP 132/84 | HR 70 | Ht 66.25 in | Wt 162.0 lb

## 2013-04-23 DIAGNOSIS — M810 Age-related osteoporosis without current pathological fracture: Secondary | ICD-10-CM

## 2013-04-23 DIAGNOSIS — Z01419 Encounter for gynecological examination (general) (routine) without abnormal findings: Secondary | ICD-10-CM

## 2013-04-23 DIAGNOSIS — Z0000 Encounter for general adult medical examination without abnormal findings: Secondary | ICD-10-CM

## 2013-04-23 LAB — BASIC METABOLIC PANEL
CO2: 30 mEq/L (ref 19–32)
Chloride: 104 mEq/L (ref 96–112)
Creat: 0.73 mg/dL (ref 0.50–1.10)
Glucose, Bld: 99 mg/dL (ref 70–99)

## 2013-04-23 LAB — LIPID PANEL
HDL: 50 mg/dL (ref >39)
LDL Cholesterol: 103 mg/dL — ABNORMAL HIGH (ref 0–99)
Total CHOL/HDL Ratio: 3.3 Ratio
Triglycerides: 66 mg/dL (ref <150)
VLDL: 13 mg/dL (ref 0–40)

## 2013-04-23 LAB — HEPATIC FUNCTION PANEL
Alkaline Phosphatase: 66 U/L (ref 39–117)
Indirect Bilirubin: 0.4 mg/dL (ref 0.0–0.9)
Total Bilirubin: 0.5 mg/dL (ref 0.3–1.2)
Total Protein: 6.7 g/dL (ref 6.0–8.3)

## 2013-04-23 NOTE — Progress Notes (Signed)
  Subjective:    Patient ID: Colleen Can, female    DOB: Nov 28, 1955, 57 y.o.   MRN: [id number]  HPI  Presents for wellness checkup. Has had flu vaccine. Regular vision and dental exams. Regular exercise. Healthy diet. No pelvic pain or vaginal bleeding.     Review of Systems  Constitutional: Negative for activity change, appetite change and fatigue.  HENT: Negative for dental problem, hearing loss, rhinorrhea and sore throat.   Eyes: Negative for visual disturbance.  Respiratory: Negative for cough, chest tightness, shortness of breath and wheezing.   Cardiovascular: Negative for chest pain and leg swelling.  Gastrointestinal: Negative for nausea, vomiting, abdominal pain, diarrhea, constipation and blood in stool.  Genitourinary: Negative for dysuria, urgency, frequency, vaginal bleeding, vaginal discharge, enuresis, difficulty urinating and pelvic pain.       Objective:   Physical Exam  Vitals reviewed. Constitutional: She is oriented to person, place, and time. She appears well-developed. No distress.  HENT:  Right Ear: External ear normal.  Left Ear: External ear normal.  Mouth/Throat: Oropharynx is clear and moist.  Neck: Normal range of motion. Neck supple. No tracheal deviation present. No thyromegaly present.  Cardiovascular: Normal rate, regular rhythm and normal heart sounds.  Exam reveals no gallop.   No murmur heard. Pulmonary/Chest: Effort normal and breath sounds normal.  Abdominal: Soft. She exhibits no distension. There is no tenderness.  Musculoskeletal: She exhibits no edema.  Lymphadenopathy:    She has no cervical adenopathy.  Neurological: She is alert and oriented to person, place, and time.  Skin: Skin is warm and dry. No rash noted.  Psychiatric: She has a normal mood and affect. Her behavior is normal.  Breast exam: no masses; axillae no adenopathy. External GU: normal. Unable to do full bimanual exam due to tightness of hymen. Rectal exam normal; no  stool for hemoccult.        Assessment & Plan:  Well woman exam  Routine general medical examination at a health care facility - Plan: Basic metabolic panel, Hepatic function panel, Lipid panel, POC Hemoccult Bld/Stl (3-Cd Home Screen)  Osteoporosis - Plan: DG Bone Density  Continue activity and healthy diet. Recommend daily vitamin D and calcium supplementation.  Next physical in one year.

## 2013-04-24 ENCOUNTER — Encounter: Payer: Self-pay | Admitting: Nurse Practitioner

## 2013-04-28 ENCOUNTER — Encounter: Payer: Self-pay | Admitting: Nurse Practitioner

## 2013-04-28 NOTE — Assessment & Plan Note (Signed)
Bone density study ordered.

## 2013-04-30 ENCOUNTER — Ambulatory Visit (HOSPITAL_COMMUNITY)
Admission: RE | Admit: 2013-04-30 | Discharge: 2013-04-30 | Disposition: A | Discharge status: Home / Self Care | Payer: BC Managed Care – PPO | Source: Ambulatory Visit | Attending: Nurse Practitioner | Admitting: Nurse Practitioner

## 2013-04-30 ENCOUNTER — Other Ambulatory Visit (INDEPENDENT_AMBULATORY_CARE_PROVIDER_SITE_OTHER): Payer: BC Managed Care – PPO | Admitting: *Deleted

## 2013-04-30 DIAGNOSIS — Z0000 Encounter for general adult medical examination without abnormal findings: Secondary | ICD-10-CM

## 2013-04-30 DIAGNOSIS — M810 Age-related osteoporosis without current pathological fracture: Secondary | ICD-10-CM

## 2013-04-30 DIAGNOSIS — M899 Disorder of bone, unspecified: Secondary | ICD-10-CM | POA: Insufficient documentation

## 2013-04-30 DIAGNOSIS — Z780 Asymptomatic menopausal state: Secondary | ICD-10-CM | POA: Insufficient documentation

## 2013-04-30 LAB — POC HEMOCCULT BLD/STL (HOME/3-CARD/SCREEN)

## 2013-05-14 NOTE — Progress Notes (Signed)
Spoke with patient and told her her stool cards were negative as well.

## 2013-05-28 ENCOUNTER — Other Ambulatory Visit: Payer: Self-pay | Admitting: Nurse Practitioner

## 2013-05-28 ENCOUNTER — Other Ambulatory Visit (HOSPITAL_COMMUNITY): Payer: Self-pay | Admitting: Family Medicine

## 2013-06-06 ENCOUNTER — Other Ambulatory Visit: Payer: Self-pay | Admitting: Nurse Practitioner

## 2013-08-15 ENCOUNTER — Other Ambulatory Visit: Payer: Self-pay | Admitting: Family Medicine

## 2013-08-15 DIAGNOSIS — Z139 Encounter for screening, unspecified: Secondary | ICD-10-CM

## 2013-08-27 ENCOUNTER — Other Ambulatory Visit: Payer: Self-pay | Admitting: Nurse Practitioner

## 2013-09-24 ENCOUNTER — Ambulatory Visit (HOSPITAL_COMMUNITY)
Admission: RE | Admit: 2013-09-24 | Discharge: 2013-09-24 | Disposition: A | Discharge status: Home / Self Care | Payer: Medicare HMO | Source: Ambulatory Visit | Attending: Family Medicine | Admitting: Family Medicine

## 2013-09-24 DIAGNOSIS — Z139 Encounter for screening, unspecified: Secondary | ICD-10-CM

## 2013-09-24 DIAGNOSIS — Z1231 Encounter for screening mammogram for malignant neoplasm of breast: Secondary | ICD-10-CM | POA: Insufficient documentation

## 2013-11-26 ENCOUNTER — Other Ambulatory Visit: Payer: Self-pay | Admitting: Family Medicine

## 2013-11-26 ENCOUNTER — Other Ambulatory Visit: Payer: Self-pay | Admitting: Nurse Practitioner

## 2013-12-04 ENCOUNTER — Other Ambulatory Visit: Payer: Self-pay | Admitting: Nurse Practitioner

## 2013-12-04 ENCOUNTER — Ambulatory Visit (INDEPENDENT_AMBULATORY_CARE_PROVIDER_SITE_OTHER): Payer: Medicare HMO | Admitting: Nurse Practitioner

## 2013-12-04 ENCOUNTER — Encounter: Payer: Self-pay | Admitting: Nurse Practitioner

## 2013-12-04 VITALS — BP 122/74 | Ht 66.25 in | Wt 166.6 lb

## 2013-12-04 DIAGNOSIS — F418 Other specified anxiety disorders: Secondary | ICD-10-CM

## 2013-12-04 DIAGNOSIS — M810 Age-related osteoporosis without current pathological fracture: Secondary | ICD-10-CM

## 2013-12-04 DIAGNOSIS — F341 Dysthymic disorder: Secondary | ICD-10-CM

## 2013-12-04 DIAGNOSIS — I10 Essential (primary) hypertension: Secondary | ICD-10-CM

## 2013-12-04 MED ORDER — POTASSIUM CHLORIDE ER 10 MEQ PO TBCR: EXTENDED_RELEASE_TABLET | ORAL | Status: DC

## 2013-12-04 MED ORDER — IBANDRONATE SODIUM 150 MG PO TABS: ORAL_TABLET | ORAL | Status: DC

## 2013-12-04 MED ORDER — CITALOPRAM HYDROBROMIDE 20 MG PO TABS: ORAL_TABLET | ORAL | Status: DC

## 2013-12-04 MED ORDER — INDAPAMIDE 1.25 MG PO TABS: ORAL_TABLET | ORAL | Status: DC

## 2013-12-04 MED ORDER — LISINOPRIL 20 MG PO TABS: ORAL_TABLET | ORAL | Status: DC

## 2013-12-08 ENCOUNTER — Encounter: Payer: Self-pay | Admitting: Nurse Practitioner

## 2013-12-08 NOTE — Progress Notes (Signed)
Subjective:  Presents for routine followup. Takes daily vitamin D and calcium. Mild edema in the lower legs at times. Had her DEXA scan in November. Some pain in the right hip/upper thigh area for the past few months worse with certain movements. No specific history of injury. No abdominal pain. No chest pain/ischemic type pain or shortness of breath. No TIA symptoms.  Objective:   BP 122/74  Ht 5' 6.25" (1.683 m)  Wt 166 lb 9.6 oz (75.569 kg)  BMI 26.68 kg/m2 NAD. Alert, oriented. Lungs clear. Heart regular rate rhythm. Lower extremities trace pitting edema. Strong right femoral pulse. Strong right DP pulse. SLR negative, good passive ROM of the right hip with minimal tenderness. Mild tenderness with palpation of the lumbar paraspinal area. Gait normal limit.  Assessment: Problem List Items Addressed This Visit     Cardiovascular and Mediastinum   Hypertension - Primary   Relevant Medications      lisinopril (PRINIVIL,ZESTRIL) tablet      indapamide (LOZOL) tablet     Musculoskeletal and Integument   Osteoporosis, unspecified   Relevant Medications      ibandronate (BONIVA) 150 MG tablet     Other   Depression with anxiety     low back pain/right hip pain Plan: Meds ordered this encounter  Medications  . potassium chloride (K-DUR) 10 MEQ tablet    Sig: TAKE ONE TABLET BY MOUTH DAILY.    Dispense:  90 tablet    Refill:  1    Order Specific Question:  Supervising Provider    Answer:  Mikey Kirschner [[id number]]  . lisinopril (PRINIVIL,ZESTRIL) 20 MG tablet    Sig: TAKE ONE TABLET BY MOUTH EVERY MORNING.    Dispense:  90 tablet    Refill:  1    Order Specific Question:  Supervising Provider    Answer:  Mikey Kirschner [[id number]]  . indapamide (LOZOL) 1.25 MG tablet    Sig: TAKE ONE TABLET BY MOUTH EACH MORNING FOR BLOOD PRESSURE AND FLUID.    Dispense:  90 tablet    Refill:  1    Order Specific Question:  Supervising Provider    Answer:  Mikey Kirschner [[id number]]  .  ibandronate (BONIVA) 150 MG tablet    Sig: TAKE (1) TABLET ONCE A MONTH ONLY. TAKE WITH 6 TO 8 OZ OF WATER AND DO NOT LAY DOWN FOR 1 HOUR.    Dispense:  1 tablet    Refill:  5    Order Specific Question:  Supervising Provider    Answer:  Mikey Kirschner [[id number]]  . citalopram (CELEXA) 20 MG tablet    Sig: Take 1 1/2 tabs po qhs    Dispense:  135 tablet    Refill:  1    Order Specific Question:  Supervising Provider    Answer:  Mikey Kirschner [[id number]]   OTC anti-inflammatories as directed. Stretching exercises. Ice/heat applications. Recheck with orthopedic specialist if symptoms persist. Followup in November for lab work and physical.

## 2014-04-25 ENCOUNTER — Ambulatory Visit (INDEPENDENT_AMBULATORY_CARE_PROVIDER_SITE_OTHER): Payer: Medicare HMO | Admitting: Nurse Practitioner

## 2014-04-25 ENCOUNTER — Encounter: Payer: Self-pay | Admitting: Nurse Practitioner

## 2014-04-25 VITALS — BP 118/80 | Ht 66.5 in | Wt 169.2 lb

## 2014-04-25 DIAGNOSIS — Z0000 Encounter for general adult medical examination without abnormal findings: Secondary | ICD-10-CM

## 2014-04-25 DIAGNOSIS — I10 Essential (primary) hypertension: Secondary | ICD-10-CM

## 2014-04-25 DIAGNOSIS — Z124 Encounter for screening for malignant neoplasm of cervix: Secondary | ICD-10-CM

## 2014-04-25 LAB — LIPID PANEL
CHOL/HDL RATIO: 3.7 ratio
Cholesterol: 174 mg/dL (ref 0–200)
HDL: 47 mg/dL (ref >39)
LDL CALC: 105 mg/dL — AB (ref 0–99)
Triglycerides: 108 mg/dL (ref <150)
VLDL: 22 mg/dL (ref 0–40)

## 2014-04-25 LAB — HEPATIC FUNCTION PANEL
ALBUMIN: 4.2 g/dL (ref 3.5–5.2)
ALT: 20 U/L (ref 0–35)
AST: 22 U/L (ref 0–37)
Alkaline Phosphatase: 62 U/L (ref 39–117)
BILIRUBIN TOTAL: 0.6 mg/dL (ref 0.2–1.2)
Bilirubin, Direct: 0.1 mg/dL (ref 0.0–0.3)
Indirect Bilirubin: 0.5 mg/dL (ref 0.2–1.2)
TOTAL PROTEIN: 6.7 g/dL (ref 6.0–8.3)

## 2014-04-25 LAB — BASIC METABOLIC PANEL
BUN: 10 mg/dL (ref 6–23)
CHLORIDE: 103 meq/L (ref 96–112)
CO2: 26 meq/L (ref 19–32)
Calcium: 9.2 mg/dL (ref 8.4–10.5)
Creat: 0.63 mg/dL (ref 0.50–1.10)
Glucose, Bld: 94 mg/dL (ref 70–99)
Potassium: 4 mEq/L (ref 3.5–5.3)
SODIUM: 140 meq/L (ref 135–145)

## 2014-04-25 MED ORDER — CITALOPRAM HYDROBROMIDE 20 MG PO TABS: ORAL_TABLET | ORAL | Status: DC

## 2014-04-25 MED ORDER — IBANDRONATE SODIUM 150 MG PO TABS: ORAL_TABLET | ORAL | Status: DC

## 2014-04-25 MED ORDER — LISINOPRIL 20 MG PO TABS: ORAL_TABLET | ORAL | Status: DC

## 2014-04-25 MED ORDER — POTASSIUM CHLORIDE ER 10 MEQ PO TBCR: EXTENDED_RELEASE_TABLET | ORAL | Status: DC

## 2014-04-25 MED ORDER — INDAPAMIDE 1.25 MG PO TABS: ORAL_TABLET | ORAL | Status: DC

## 2014-04-25 NOTE — Progress Notes (Signed)
Subjective:    Patient ID: Michelle Landry, female    DOB: 1955/09/02, 58 y.o.   MRN: [id number]  HPI presents for her wellness exam. Regular vision and dental exams. No history of intercourse. No vaginal bleeding or pelvic pain. No family history of breast or ovarian cancer. Doing well with diet. Regular exercise. Takes vitamin D and calcium daily.    Review of Systems  Constitutional: Negative for fever, activity change, appetite change and fatigue.  HENT: Negative for dental problem, ear pain, sinus pressure and sore throat.   Respiratory: Negative for cough, chest tightness, shortness of breath and wheezing.   Cardiovascular: Negative for chest pain and leg swelling.  Gastrointestinal: Negative for nausea, vomiting, abdominal pain, diarrhea, constipation and abdominal distention.  Genitourinary: Negative for dysuria, urgency, frequency, vaginal bleeding, vaginal discharge, enuresis, difficulty urinating and pelvic pain.  Musculoskeletal: Positive for arthralgias.       Objective:   Physical Exam  Constitutional: She is oriented to person, place, and time. She appears well-developed. No distress.  HENT:  Right Ear: External ear normal.  Left Ear: External ear normal.  Mouth/Throat: Oropharynx is clear and moist.  Neck: Normal range of motion. Neck supple. No tracheal deviation present. No thyromegaly present.  Cardiovascular: Normal rate, regular rhythm and normal heart sounds.  Exam reveals no gallop.   No murmur heard. Pulmonary/Chest: Effort normal and breath sounds normal.  Abdominal: Soft. She exhibits no distension. There is no tenderness.  Genitourinary:  Hymen still intact and very tight. Blind swab for PAP smear. Limited bimanual exam; no obvious tenderness or masses. Rectal exam: no masses or stool for hemoccult.   Musculoskeletal: She exhibits no edema.  Lymphadenopathy:    She has no cervical adenopathy.  Neurological: She is alert and oriented to person, place, and  time.  Skin: Skin is warm and dry. No rash noted.  Psychiatric: She has a normal mood and affect. Her behavior is normal.  Vitals reviewed. Breast exam: no masses; axillae no adenopathy.         Assessment & Plan:   Problem List Items Addressed This Visit      Cardiovascular and Mediastinum   Hypertension   Relevant Medications      lisinopril (PRINIVIL,ZESTRIL) tablet      indapamide (LOZOL) tablet    Other Visit Diagnoses    Routine general medical examination at a health care facility    -  Primary    Relevant Orders       Lipid panel       Hepatic function panel       Basic metabolic panel       POC Hemoccult Bld/Stl (3-Cd Home Screen)    Screening for cervical cancer        Relevant Orders       Pap IG w/ reflex to HPV when ASC-U      Meds ordered this encounter  Medications  . potassium chloride (K-DUR) 10 MEQ tablet    Sig: TAKE ONE TABLET BY MOUTH DAILY.    Dispense:  90 tablet    Refill:  1    Order Specific Question:  Supervising Provider    Answer:  Mikey Kirschner [[id number]]  . lisinopril (PRINIVIL,ZESTRIL) 20 MG tablet    Sig: TAKE ONE TABLET BY MOUTH EVERY MORNING.    Dispense:  90 tablet    Refill:  1    Order Specific Question:  Supervising Provider    Answer:  Mikey Kirschner [[id number]]  .  indapamide (LOZOL) 1.25 MG tablet    Sig: TAKE ONE TABLET BY MOUTH EACH MORNING FOR BLOOD PRESSURE AND FLUID.    Dispense:  90 tablet    Refill:  1    Order Specific Question:  Supervising Provider    Answer:  Mikey Kirschner [[id number]]  . ibandronate (BONIVA) 150 MG tablet    Sig: TAKE (1) TABLET ONCE A MONTH ONLY. TAKE WITH 6 TO 8 OZ OF WATER AND DO NOT LAY DOWN FOR 1 HOUR.    Dispense:  1 tablet    Refill:  5    Order Specific Question:  Supervising Provider    Answer:  Mikey Kirschner [[id number]]  . citalopram (CELEXA) 20 MG tablet    Sig: Take 1 1/2 tabs po qhs    Dispense:  135 tablet    Refill:  1    Order Specific Question:  Supervising Provider     Answer:  Mikey Kirschner [[id number]]    Return in about 6 months (around 10/24/2014).

## 2014-04-26 LAB — PAP IG W/ RFLX HPV ASCU

## 2014-04-29 LAB — POC HEMOCCULT BLD/STL (HOME/3-CARD/SCREEN)
Card #3 Fecal Occult Blood, POC: NEGATIVE
FECAL OCCULT BLD: NEGATIVE
FECAL OCCULT BLD: NEGATIVE

## 2014-07-08 ENCOUNTER — Encounter: Payer: Self-pay | Admitting: Nurse Practitioner

## 2014-07-08 ENCOUNTER — Ambulatory Visit (INDEPENDENT_AMBULATORY_CARE_PROVIDER_SITE_OTHER): Payer: Medicare HMO | Admitting: Nurse Practitioner

## 2014-07-08 VITALS — BP 146/80 | Temp 99.0°F | Ht 66.5 in | Wt 176.0 lb

## 2014-07-08 DIAGNOSIS — J329 Chronic sinusitis, unspecified: Secondary | ICD-10-CM

## 2014-07-08 DIAGNOSIS — K219 Gastro-esophageal reflux disease without esophagitis: Secondary | ICD-10-CM

## 2014-07-08 DIAGNOSIS — J209 Acute bronchitis, unspecified: Secondary | ICD-10-CM

## 2014-07-08 DIAGNOSIS — R062 Wheezing: Secondary | ICD-10-CM

## 2014-07-08 MED ORDER — PREDNISONE 20 MG PO TABS: ORAL_TABLET | ORAL | Status: DC

## 2014-07-08 MED ORDER — ALBUTEROL SULFATE HFA 108 (90 BASE) MCG/ACT IN AERS: 2.0000 | INHALATION_SPRAY | RESPIRATORY_TRACT | Status: DC | PRN

## 2014-07-08 MED ORDER — AMOXICILLIN-POT CLAVULANATE 875-125 MG PO TABS: 1.0000 | ORAL_TABLET | Freq: Two times a day (BID) | ORAL | Status: DC

## 2014-07-08 MED ORDER — HYDROCODONE-HOMATROPINE 5-1.5 MG/5ML PO SYRP: 5.0000 mL | ORAL_SOLUTION | ORAL | Status: DC | PRN

## 2014-07-08 MED ORDER — RANITIDINE HCL 150 MG PO CAPS: 150.0000 mg | ORAL_CAPSULE | Freq: Two times a day (BID) | ORAL | Status: DC

## 2014-07-08 NOTE — Progress Notes (Signed)
Subjective:  Presents for c/o cough and congestion x 2 weeks. Worse at night and late afternoon. No fever. Frequent cough producing white to green sputum. xheezing x 2 nights. Does not have inhaler. Ear pressure. No sore throat. Headache around the eyes. Acid reflux for a few weeks. No N/V. No abdominal pain. No caffeine, tobacco or alcohol use. Has been taking NSAIDs. [place]: her mother has reflux.  Objective:   BP 146/80 mmHg  Temp(Src) 99 F (37.2 C) (Oral)  Ht 5' 6.5" (1.689 m)  Wt 176 lb (79.833 kg)  BMI 27.98 kg/m2 NAD. Alert, oriented. TMs clear effusion, no erythema. Pharynx mild erythema with green PND noted. Neck supple with mild soft anterior adenopathy. Lungs scattered expiratory rhonchi rare expiratory wheeze left upper lobe posterior only. No tachypnea. Heart RRR. Abdomen soft, non distended non tender.   Assessment: Rhinosinusitis  Acute bronchitis, unspecified organism  Wheezing  Gastroesophageal reflux disease, esophagitis presence not specified  Plan:  Meds ordered this encounter  Medications  . amoxicillin-clavulanate (AUGMENTIN) 875-125 MG per tablet    Sig: Take 1 tablet by mouth 2 (two) times daily.    Dispense:  20 tablet    Refill:  0    Order Specific Question:  Supervising Provider    Answer:  Mikey Kirschner [[id number]]  . predniSONE (DELTASONE) 20 MG tablet    Sig: 3 po qd x 3 d then 2 po qd x 3 d then 1 po qd x 3 d    Dispense:  18 tablet    Refill:  0    Order Specific Question:  Supervising Provider    Answer:  Mikey Kirschner [[id number]]  . albuterol (PROVENTIL HFA;VENTOLIN HFA) 108 (90 BASE) MCG/ACT inhaler    Sig: Inhale 2 puffs into the lungs every 4 (four) hours as needed.    Dispense:  1 Inhaler    Refill:  0    Order Specific Question:  Supervising Provider    Answer:  Mikey Kirschner [[id number]]  . HYDROcodone-homatropine (HYCODAN) 5-1.5 MG/5ML syrup    Sig: Take 5 mLs by mouth every 4 (four) hours as needed.    Dispense:  120 mL    Refill:  0   Order Specific Question:  Supervising Provider    Answer:  Mikey Kirschner [[id number]]  . ranitidine (ZANTAC) 150 MG capsule    Sig: Take 1 capsule (150 mg total) by mouth 2 (two) times daily. Prn acid reflux    Dispense:  60 capsule    Refill:  0    Order Specific Question:  Supervising Provider    Answer:  Mikey Kirschner [[id number]]   Avoid NSAIDs especially while on prednisone. Also avoid decongestants due to BP. Call back by end of week if no better, sooner if worse.

## 2014-07-08 NOTE — Patient Instructions (Signed)

## 2014-08-23 ENCOUNTER — Other Ambulatory Visit: Payer: Self-pay | Admitting: Family Medicine

## 2014-08-23 DIAGNOSIS — Z1231 Encounter for screening mammogram for malignant neoplasm of breast: Secondary | ICD-10-CM

## 2014-09-26 ENCOUNTER — Ambulatory Visit (HOSPITAL_COMMUNITY): Payer: Medicare HMO

## 2014-09-26 ENCOUNTER — Ambulatory Visit (HOSPITAL_COMMUNITY)
Admission: RE | Admit: 2014-09-26 | Discharge: 2014-09-26 | Disposition: A | Discharge status: Home / Self Care | Payer: Medicare HMO | Source: Ambulatory Visit | Attending: Family Medicine | Admitting: Family Medicine

## 2014-09-26 DIAGNOSIS — Z1231 Encounter for screening mammogram for malignant neoplasm of breast: Secondary | ICD-10-CM | POA: Insufficient documentation

## 2014-10-21 ENCOUNTER — Ambulatory Visit (INDEPENDENT_AMBULATORY_CARE_PROVIDER_SITE_OTHER): Payer: Medicare HMO | Admitting: Nurse Practitioner

## 2014-10-21 ENCOUNTER — Encounter: Payer: Self-pay | Admitting: Nurse Practitioner

## 2014-10-21 VITALS — BP 122/80 | Ht 66.5 in | Wt 168.6 lb

## 2014-10-21 DIAGNOSIS — K219 Gastro-esophageal reflux disease without esophagitis: Secondary | ICD-10-CM

## 2014-10-21 DIAGNOSIS — F418 Other specified anxiety disorders: Secondary | ICD-10-CM

## 2014-10-21 DIAGNOSIS — I10 Essential (primary) hypertension: Secondary | ICD-10-CM | POA: Diagnosis not present

## 2014-10-21 DIAGNOSIS — M810 Age-related osteoporosis without current pathological fracture: Secondary | ICD-10-CM | POA: Diagnosis not present

## 2014-10-21 MED ORDER — IBANDRONATE SODIUM 150 MG PO TABS: ORAL_TABLET | ORAL | Status: DC

## 2014-10-21 MED ORDER — INDAPAMIDE 1.25 MG PO TABS
ORAL_TABLET | ORAL | Status: DC
Start: 2014-10-21 — End: 2015-05-21

## 2014-10-21 MED ORDER — POTASSIUM CHLORIDE ER 10 MEQ PO TBCR: EXTENDED_RELEASE_TABLET | ORAL | Status: DC

## 2014-10-21 MED ORDER — LISINOPRIL 20 MG PO TABS: ORAL_TABLET | ORAL | Status: DC

## 2014-10-21 MED ORDER — RANITIDINE HCL 150 MG PO CAPS: 150.0000 mg | ORAL_CAPSULE | Freq: Two times a day (BID) | ORAL | Status: DC

## 2014-10-21 MED ORDER — CITALOPRAM HYDROBROMIDE 20 MG PO TABS: ORAL_TABLET | ORAL | Status: DC

## 2014-10-21 NOTE — Progress Notes (Signed)
Subjective:  Presents for routine follow-up. Has a flareup of her acid reflux lately, has been off her Zantac. No chest pain/ischemic type pain or shortness of breath. Anxiety and depression is stable on Celexa. Minimal edema. Takes Tylenol for arthritis pain. No caffeine intake.  Objective:   BP 122/80 mmHg  Ht 5' 6.5" (1.689 m)  Wt 168 lb 9.6 oz (76.476 kg)  BMI 26.81 kg/m2 NAD. Alert, oriented. Lungs clear. Heart regular rate rhythm. No murmur or gallop noted. Abdomen soft nondistended nontender. Lower extremities trace pitting edema.  Assessment:  Problem List Items Addressed This Visit      Cardiovascular and Mediastinum   Hypertension - Primary   Relevant Medications   lisinopril (PRINIVIL,ZESTRIL) 20 MG tablet   indapamide (LOZOL) 1.25 MG tablet     Musculoskeletal and Integument   Osteoporosis   Relevant Medications   ibandronate (BONIVA) 150 MG tablet     Other   Depression with anxiety    Other Visit Diagnoses    Gastroesophageal reflux disease without esophagitis        Relevant Medications    ranitidine (ZANTAC) 150 MG capsule      Plan:  Meds ordered this encounter  Medications  . ranitidine (ZANTAC) 150 MG capsule    Sig: Take 1 capsule (150 mg total) by mouth 2 (two) times daily. Prn acid reflux    Dispense:  180 capsule    Refill:  1    Order Specific Question:  Supervising Provider    Answer:  Mikey Kirschner [[id number]]  . potassium chloride (K-DUR) 10 MEQ tablet    Sig: TAKE ONE TABLET BY MOUTH DAILY.    Dispense:  90 tablet    Refill:  1    Order Specific Question:  Supervising Provider    Answer:  Mikey Kirschner [[id number]]  . lisinopril (PRINIVIL,ZESTRIL) 20 MG tablet    Sig: TAKE ONE TABLET BY MOUTH EVERY MORNING.    Dispense:  90 tablet    Refill:  1    Order Specific Question:  Supervising Provider    Answer:  Mikey Kirschner [[id number]]  . indapamide (LOZOL) 1.25 MG tablet    Sig: TAKE ONE TABLET BY MOUTH EACH MORNING FOR BLOOD PRESSURE AND  FLUID.    Dispense:  90 tablet    Refill:  1    Order Specific Question:  Supervising Provider    Answer:  Mikey Kirschner [[id number]]  . ibandronate (BONIVA) 150 MG tablet    Sig: TAKE (1) TABLET ONCE A MONTH ONLY. TAKE WITH 6 TO 8 OZ OF WATER AND DO NOT LAY DOWN FOR 1 HOUR.    Dispense:  1 tablet    Refill:  5    Order Specific Question:  Supervising Provider    Answer:  Mikey Kirschner [[id number]]  . citalopram (CELEXA) 20 MG tablet    Sig: Take 1 1/2 tabs po qhs    Dispense:  135 tablet    Refill:  1    Order Specific Question:  Supervising Provider    Answer:  Mikey Kirschner [[id number]]   Continue current meds as directed. Restart Zantac as directed for the next few weeks, call if symptoms persist. May then go back to when necessary dosing. Return in about 6 months (around 04/23/2015) for physical.

## 2015-03-13 ENCOUNTER — Ambulatory Visit (INDEPENDENT_AMBULATORY_CARE_PROVIDER_SITE_OTHER): Payer: Medicare HMO | Admitting: Nurse Practitioner

## 2015-03-13 ENCOUNTER — Encounter: Payer: Self-pay | Admitting: Nurse Practitioner

## 2015-03-13 ENCOUNTER — Ambulatory Visit (HOSPITAL_COMMUNITY)
Admission: RE | Admit: 2015-03-13 | Discharge: 2015-03-13 | Disposition: A | Discharge status: Home / Self Care | Payer: Medicare HMO | Source: Ambulatory Visit | Attending: Nurse Practitioner | Admitting: Nurse Practitioner

## 2015-03-13 VITALS — BP 114/76 | Temp 98.3°F | Ht 66.5 in | Wt 167.4 lb

## 2015-03-13 DIAGNOSIS — X501XXA Overexertion from prolonged static or awkward postures, initial encounter: Secondary | ICD-10-CM | POA: Diagnosis not present

## 2015-03-13 DIAGNOSIS — Y939 Activity, unspecified: Secondary | ICD-10-CM | POA: Diagnosis not present

## 2015-03-13 DIAGNOSIS — S93401A Sprain of unspecified ligament of right ankle, initial encounter: Secondary | ICD-10-CM | POA: Diagnosis not present

## 2015-03-13 DIAGNOSIS — M79671 Pain in right foot: Secondary | ICD-10-CM

## 2015-03-13 DIAGNOSIS — S90921A Unspecified superficial injury of right foot, initial encounter: Secondary | ICD-10-CM | POA: Diagnosis not present

## 2015-03-13 DIAGNOSIS — Z23 Encounter for immunization: Secondary | ICD-10-CM

## 2015-03-14 ENCOUNTER — Telehealth: Payer: Self-pay | Admitting: Nurse Practitioner

## 2015-03-14 DIAGNOSIS — S96911A Strain of unspecified muscle and tendon at ankle and foot level, right foot, initial encounter: Secondary | ICD-10-CM | POA: Diagnosis not present

## 2015-03-14 NOTE — Telephone Encounter (Signed)
Patient called today in regards to Rocker boot. Patient wants to know how long is she supposed to wear the boot?

## 2015-03-17 ENCOUNTER — Encounter: Payer: Self-pay | Admitting: Nurse Practitioner

## 2015-03-17 NOTE — Telephone Encounter (Signed)
Notified patient wear the boot for a least 2 weeks and see how you feel. You may need to wear it longer. Patient verbalized understanding.

## 2015-03-17 NOTE — Telephone Encounter (Signed)
Wear the boot for a least 2 weeks and see how you feel. You may need to wear it longer.

## 2015-03-17 NOTE — Progress Notes (Signed)
Subjective:  Presents for c/o pain along the lateral part of the top of the left foot after twisting her foot 2 days ago. Difficulty with weight bearing. Has used ice packs and ACE wrap. Has osteoporosis.  Objective:   BP 114/76 mmHg  Temp(Src) 98.3 F (36.8 C) (Oral)  Ht 5' 6.5" (1.689 m)  Wt 167 lb 6 oz (75.921 kg)  BMI 26.61 kg/m2 NAD. Alert, oriented. Mild edema, no discoloration noted along the lateral dorsum of left foot. Mildly tender to palpation. Good ROM of the ankle; mild tenderness with internal rotation; no joint laxity.   Assessment: Right foot pain - Plan: DG Foot Complete Right  Encounter for immunization  Right ankle sprain, initial encounter  Plan: prescription given for boot. See xray report. Recheck in 2 weeks if no improvement. Tylenol as directed for pain.

## 2015-04-07 ENCOUNTER — Other Ambulatory Visit: Payer: Self-pay | Admitting: Nurse Practitioner

## 2015-04-22 ENCOUNTER — Other Ambulatory Visit: Payer: Self-pay | Admitting: Nurse Practitioner

## 2015-04-22 NOTE — Telephone Encounter (Signed)
Ok times one rx 

## 2015-04-28 ENCOUNTER — Encounter: Payer: Medicare HMO | Admitting: Nurse Practitioner

## 2015-04-30 ENCOUNTER — Encounter: Payer: Self-pay | Admitting: Nurse Practitioner

## 2015-04-30 ENCOUNTER — Ambulatory Visit (INDEPENDENT_AMBULATORY_CARE_PROVIDER_SITE_OTHER): Payer: Medicare HMO | Admitting: Nurse Practitioner

## 2015-04-30 VITALS — BP 120/78 | Ht 66.5 in | Wt 165.0 lb

## 2015-04-30 DIAGNOSIS — M810 Age-related osteoporosis without current pathological fracture: Secondary | ICD-10-CM | POA: Diagnosis not present

## 2015-04-30 DIAGNOSIS — I10 Essential (primary) hypertension: Secondary | ICD-10-CM

## 2015-04-30 DIAGNOSIS — Z1159 Encounter for screening for other viral diseases: Secondary | ICD-10-CM

## 2015-04-30 DIAGNOSIS — S80812A Abrasion, left lower leg, initial encounter: Secondary | ICD-10-CM

## 2015-04-30 DIAGNOSIS — Z79899 Other long term (current) drug therapy: Secondary | ICD-10-CM | POA: Diagnosis not present

## 2015-04-30 DIAGNOSIS — Z0000 Encounter for general adult medical examination without abnormal findings: Secondary | ICD-10-CM

## 2015-04-30 DIAGNOSIS — Z23 Encounter for immunization: Secondary | ICD-10-CM | POA: Diagnosis not present

## 2015-04-30 NOTE — Progress Notes (Signed)
   Subjective:    Patient ID: Michelle Landry, female    DOB: February 16, 1956, 59 y.o.   MRN: [id number]  HPI Patient has a scratch on her left lower leg. TD was given. Presents for her wellness exam. Is due for her colonoscopy. Denies any history of sexual activity. No vaginal bleeding or pelvic pain. Regular vision and dental care. Takes daily vitamin D and calcium. Also her sister was just diagnosed with breast cancer, did not have chemotherapy or radiation. No other close relatives with breast cancer. Regular exercise. Overall healthy diet.   Review of Systems  Constitutional: Negative for activity change, appetite change and fatigue.  HENT: Negative for dental problem, ear pain, sinus pressure and sore throat.   Respiratory: Negative for cough, chest tightness, shortness of breath and wheezing.   Cardiovascular: Negative for chest pain.  Gastrointestinal: Negative for nausea, vomiting, abdominal pain, diarrhea, constipation and abdominal distention.  Genitourinary: Negative for dysuria, urgency, frequency, vaginal bleeding, vaginal discharge, enuresis, difficulty urinating, genital sores and pelvic pain.       Objective:   Physical Exam  Constitutional: She is oriented to person, place, and time. She appears well-developed. No distress.  HENT:  Right Ear: External ear normal.  Left Ear: External ear normal.  Mouth/Throat: Oropharynx is clear and moist.  Neck: Normal range of motion. Neck supple. No tracheal deviation present. No thyromegaly present.  Cardiovascular: Normal rate, regular rhythm and normal heart sounds.  Exam reveals no gallop.   No murmur heard. Pulmonary/Chest: Effort normal and breath sounds normal.  Abdominal: Soft. She exhibits no distension. There is no tenderness.  Genitourinary:  Defers pelvic exam, denies any problems. Patient to call office if any unusual pelvic pain. Defers rectal exam and Hemoccult cards, plans colonoscopy.  Musculoskeletal: She exhibits no  edema.  Lymphadenopathy:    She has no cervical adenopathy.  Neurological: She is alert and oriented to person, place, and time.  Skin: Skin is warm and dry. No rash noted.  Psychiatric: She has a normal mood and affect. Her behavior is normal.  Vitals reviewed.  breast exam: Slightly dense tissue, no dominant masses. Axilla no adenopathy.        Assessment & Plan:   Problem List Items Addressed This Visit      Cardiovascular and Mediastinum   Hypertension   Relevant Orders   Lipid panel (Completed)     Musculoskeletal and Integument   Osteoporosis   Relevant Orders   DG Bone Density    Other Visit Diagnoses    Routine general medical examination at a health care facility    -  Primary    Scratch of lower leg, left, initial encounter        Relevant Orders    Td vaccine greater than or equal to 7yo preservative free IM (Completed)    High risk medication use        Relevant Orders    Hepatic function panel (Completed)    Basic metabolic panel (Completed)    Need for hepatitis C screening test        Relevant Orders    Hepatitis C Antibody (Completed)      Encouraged continued exercise and daily vitamin D/calcium supplementation. DEXA scan and lab work pending. Given information on colonoscopy so she can schedule her own appointment, call back if any problems. Return in about 6 months (around 10/28/2015) for recheck.

## 2015-05-01 LAB — BASIC METABOLIC PANEL
BUN/Creatinine Ratio: 20 (ref 11–26)
BUN: 12 mg/dL (ref 8–27)
CO2: 26 mmol/L (ref 18–29)
CREATININE: 0.61 mg/dL (ref 0.57–1.00)
Calcium: 9.6 mg/dL (ref 8.7–10.3)
Chloride: 100 mmol/L (ref 97–106)
GFR calc Af Amer: 114 mL/min/{1.73_m2} (ref >59)
GFR calc non Af Amer: 99 mL/min/{1.73_m2} (ref >59)
GLUCOSE: 92 mg/dL (ref 65–99)
Potassium: 4.1 mmol/L (ref 3.5–5.2)
SODIUM: 142 mmol/L (ref 136–144)

## 2015-05-01 LAB — HEPATIC FUNCTION PANEL
ALBUMIN: 4.4 g/dL (ref 3.6–4.8)
ALK PHOS: 75 IU/L (ref 39–117)
ALT: 15 IU/L (ref 0–32)
AST: 18 IU/L (ref 0–40)
BILIRUBIN TOTAL: 0.5 mg/dL (ref 0.0–1.2)
BILIRUBIN, DIRECT: 0.12 mg/dL (ref 0.00–0.40)
Total Protein: 6.5 g/dL (ref 6.0–8.5)

## 2015-05-01 LAB — LIPID PANEL
CHOL/HDL RATIO: 3.5 ratio (ref 0.0–4.4)
Cholesterol, Total: 159 mg/dL (ref 100–199)
HDL: 46 mg/dL (ref >39)
LDL Calculated: 89 mg/dL (ref 0–99)
Triglycerides: 118 mg/dL (ref 0–149)
VLDL Cholesterol Cal: 24 mg/dL (ref 5–40)

## 2015-05-01 LAB — HEPATITIS C ANTIBODY

## 2015-05-02 ENCOUNTER — Encounter: Payer: Self-pay | Admitting: Nurse Practitioner

## 2015-05-06 ENCOUNTER — Ambulatory Visit (HOSPITAL_COMMUNITY)
Admission: RE | Admit: 2015-05-06 | Discharge: 2015-05-06 | Disposition: A | Discharge status: Home / Self Care | Payer: Medicare HMO | Source: Ambulatory Visit | Attending: Nurse Practitioner | Admitting: Nurse Practitioner

## 2015-05-06 DIAGNOSIS — Z780 Asymptomatic menopausal state: Secondary | ICD-10-CM | POA: Insufficient documentation

## 2015-05-06 DIAGNOSIS — M810 Age-related osteoporosis without current pathological fracture: Secondary | ICD-10-CM | POA: Insufficient documentation

## 2015-05-06 DIAGNOSIS — M8580 Other specified disorders of bone density and structure, unspecified site: Secondary | ICD-10-CM | POA: Insufficient documentation

## 2015-05-07 ENCOUNTER — Telehealth: Payer: Self-pay

## 2015-05-07 NOTE — Telephone Encounter (Signed)
Gastroenterology Pre-Procedure Review  Request Date: 05/07/2015 Requesting Physician: Dr. Wolfgang Phoenix  PATIENT REVIEW QUESTIONS: The patient responded to the following health history questions as indicated:    PT SAID HER LAST COLONOSCOPY WAS IN 2006 BY DR. Arnoldo Morale  1. Diabetes Melitis: no 2. Joint replacements in the past 12 months: no 3. Major health problems in the past 3 months: no 4. Has an artificial valve or MVP: no 5. Has a defibrillator: no 6. Has been advised in past to take antibiotics in advance of a procedure like teeth cleaning: YES/ LEFT HIP AND RIGHT KNEE REPLACEMENT IN 2012 7. Family history of colon cancer: no  8. Alcohol Use: no 9. History of sleep apnea: no     MEDICATIONS & ALLERGIES:    Patient reports the following regarding taking any blood thinners:   Plavix? no Aspirin? no Coumadin? no  Patient confirms/reports the following medications:  Current Outpatient Prescriptions  Medication Sig Dispense Refill  . albuterol (PROVENTIL HFA;VENTOLIN HFA) 108 (90 BASE) MCG/ACT inhaler Inhale 2 puffs into the lungs every 4 (four) hours as needed. 1 Inhaler 0  . citalopram (CELEXA) 20 MG tablet TAKE 1 AND 1/2 TABLETS BY MOUTH NIGHTLY AT BEDTIME. 135 tablet 0  . ibandronate (BONIVA) 150 MG tablet TAKE (1) TABLET ONCE A MONTH ONLY. TAKE WITH 6 TO 8 OZ OF WATER AND DO NOT LAY DOWN FOR 1 HOUR. 1 tablet 2  . indapamide (LOZOL) 1.25 MG tablet TAKE ONE TABLET BY MOUTH EACH MORNING FOR BLOOD PRESSURE AND FLUID. 90 tablet 1  . lisinopril (PRINIVIL,ZESTRIL) 20 MG tablet TAKE ONE TABLET BY MOUTH EVERY MORNING. 90 tablet 1  . potassium chloride (K-DUR) 10 MEQ tablet TAKE ONE TABLET BY MOUTH DAILY. 90 tablet 1  . ranitidine (ZANTAC) 150 MG capsule Take 1 capsule (150 mg total) by mouth 2 (two) times daily. Prn acid reflux 180 capsule 1   No current facility-administered medications for this visit.    Patient confirms/reports the following allergies:  Allergies  Allergen Reactions   . Sulfa Antibiotics     Upset stomach - no allergic reaction    No orders of the defined types were placed in this encounter.    AUTHORIZATION INFORMATION Primary Insurance:   ID #:   Group #:  Pre-Cert / Auth required Pre-Cert / Auth #:   Secondary Insurance:   ID #:   Group #:  Pre-Cert / Auth required:  Pre-Cert / Auth #:   SCHEDULE INFORMATION: Procedure has been scheduled as follows:  Date: 06/04/2015              Time:  9:15 Am Location: [facility] Short Stay  This Gastroenterology Pre-Precedure Review Form is being routed to the following provider(s): Barney Drain, MD

## 2015-05-07 NOTE — Telephone Encounter (Signed)
Pt called to say that Dr Wolfgang Phoenix recommended that she call us to schedule her colonoscopy. Please call her at [phone]

## 2015-05-08 ENCOUNTER — Other Ambulatory Visit: Payer: Self-pay

## 2015-05-08 DIAGNOSIS — Z1211 Encounter for screening for malignant neoplasm of colon: Secondary | ICD-10-CM

## 2015-05-08 MED ORDER — SOD PICOSULFATE-MAG OX-CIT ACD 10-3.5-12 MG-GM-GM PO PACK: 1.0000 | PACK | ORAL | Status: DC

## 2015-05-08 NOTE — Telephone Encounter (Signed)
Rx sent to the pharmacy and instructions mailed to pt.  

## 2015-05-08 NOTE — Telephone Encounter (Signed)
PREPOPIK-DRINK WATER TO KEEP URINE LIGHT YELLOW. FULL LIQUIDS WITH BREAKFAST.  Full Liquid Diet A high-calorie, high-protein supplement should be used to meet your nutritional requirements when the full liquid diet is continued for more than 2 or 3 days. If this diet is to be used for an extended period of time (more than 7 days), a multivitamin should be considered.  Breads and Starches  Allowed: None are allowed except crackers pureed (made into a thick, smooth soup) in soup.   Avoid: Any others.    Potatoes/Pasta/Rice  Allowed: ANY ITEM AS A SOUP OR SMALL PLATE OF MASHED POTATOES OR SCRAMBLED EGGS.       Vegetables  Allowed: Strained tomato or vegetable juice. Vegetables pureed in soup.   Avoid: Any others.    Fruit  Allowed: Any strained fruit juices and fruit drinks. Include 1 serving of citrus or vitamin C-enriched fruit juice daily.   Avoid: Any others.  Meat and Meat Substitutes  Allowed: Egg  Avoid: Any meat, fish, or fowl. All cheese.  Milk  Allowed: SOY Milk beverages, including milk shakes and instant breakfast mixes. Smooth yogurt.   Avoid: Any others. Avoid dairy products if not tolerated.    Soups and Combination Foods  Allowed: Broth, strained cream soups. Strained, broth-based soups.   Avoid: Any others.    Desserts and Sweets  Allowed: flavored gelatin, tapioca, ice cream, sherbet, smooth pudding, junket, fruit ices, frozen ice pops, pudding pops, frozen fudge pops, chocolate syrup. Sugar, honey, jelly, syrup.   Avoid: Any others.  Fats and Oils  Allowed: Margarine, butter, cream, sour cream, oils.   Avoid: Any others.  Beverages  Allowed: All.   Avoid: None.  Condiments  Allowed: Iodized salt, pepper, spices, flavorings. Cocoa powder.   Avoid: Any others.    SAMPLE MEAL PLAN Breakfast   cup orange juice.   1 OR 2 EGGS  1 cup milk.   1 cup beverage (coffee or tea).   Cream or sugar, if desired.    Midmorning  Snack  2 SCRAMBLED OR HARD BOILED EGG   Lunch  1 cup cream soup.    cup fruit juice.   1 cup milk.    cup custard.   1 cup beverage (coffee or tea).   Cream or sugar, if desired.    Midafternoon Snack  1 cup milk shake.  Dinner  1 cup cream soup.    cup fruit juice.   1 cup MILK    cup pudding.   1 cup beverage (coffee or tea).   Cream or sugar, if desired.  Evening Snack  1 cup supplement.  To increase calories, add sugar, cream, butter, or margarine if possible. Nutritional supplements will also increase the total calories.

## 2015-05-08 NOTE — Addendum Note (Signed)
Addended by: Everardo All on: 05/08/2015 08:46 AM   Modules accepted: Orders

## 2015-05-21 ENCOUNTER — Other Ambulatory Visit: Payer: Self-pay | Admitting: Nurse Practitioner

## 2015-06-03 ENCOUNTER — Telehealth: Payer: Self-pay

## 2015-06-03 NOTE — Telephone Encounter (Signed)
I called [employer] @ [phone] and got the automation. Precert is not required for the CPT code of 29562  ( screening colonoscopy).

## 2015-06-04 ENCOUNTER — Encounter (HOSPITAL_COMMUNITY): Payer: Self-pay | Admitting: *Deleted

## 2015-06-04 ENCOUNTER — Encounter (HOSPITAL_COMMUNITY)
Admission: RE | Disposition: A | Discharge status: Home / Self Care | Payer: Self-pay | Source: Ambulatory Visit | Attending: Gastroenterology

## 2015-06-04 ENCOUNTER — Ambulatory Visit (HOSPITAL_COMMUNITY)
Admission: RE | Admit: 2015-06-04 | Discharge: 2015-06-04 | Disposition: A | Discharge status: Home / Self Care | Payer: Medicare HMO | Source: Ambulatory Visit | Attending: Gastroenterology | Admitting: Gastroenterology

## 2015-06-04 DIAGNOSIS — Z1211 Encounter for screening for malignant neoplasm of colon: Secondary | ICD-10-CM | POA: Diagnosis not present

## 2015-06-04 DIAGNOSIS — K648 Other hemorrhoids: Secondary | ICD-10-CM | POA: Insufficient documentation

## 2015-06-04 DIAGNOSIS — I10 Essential (primary) hypertension: Secondary | ICD-10-CM | POA: Insufficient documentation

## 2015-06-04 DIAGNOSIS — K644 Residual hemorrhoidal skin tags: Secondary | ICD-10-CM | POA: Diagnosis not present

## 2015-06-04 DIAGNOSIS — Z86010 Personal history of colonic polyps: Secondary | ICD-10-CM

## 2015-06-04 DIAGNOSIS — Z79899 Other long term (current) drug therapy: Secondary | ICD-10-CM | POA: Diagnosis not present

## 2015-06-04 DIAGNOSIS — Z96651 Presence of right artificial knee joint: Secondary | ICD-10-CM | POA: Diagnosis not present

## 2015-06-04 DIAGNOSIS — Z96642 Presence of left artificial hip joint: Secondary | ICD-10-CM | POA: Insufficient documentation

## 2015-06-04 DIAGNOSIS — D120 Benign neoplasm of cecum: Secondary | ICD-10-CM | POA: Diagnosis not present

## 2015-06-04 HISTORY — DX: Personal history of other diseases of the respiratory system: Z87.09

## 2015-06-04 HISTORY — PX: COLONOSCOPY: SHX5424

## 2015-06-04 SURGERY — COLONOSCOPY
Anesthesia: Moderate Sedation

## 2015-06-04 MED ORDER — MEPERIDINE HCL 100 MG/ML IJ SOLN
INTRAMUSCULAR | Status: AC
  Filled 2015-06-04: qty 2

## 2015-06-04 MED ORDER — STERILE WATER FOR IRRIGATION IR SOLN
Status: DC | PRN
  Administered 2015-06-04: 09:00:00

## 2015-06-04 MED ORDER — MIDAZOLAM HCL 5 MG/5ML IJ SOLN
INTRAMUSCULAR | Status: DC | PRN
  Administered 2015-06-04 (×3): 2 mg via INTRAVENOUS

## 2015-06-04 MED ORDER — MIDAZOLAM HCL 5 MG/5ML IJ SOLN
INTRAMUSCULAR | Status: AC
  Filled 2015-06-04: qty 10

## 2015-06-04 MED ORDER — SODIUM CHLORIDE 0.9 % IV SOLN
INTRAVENOUS | Status: DC
  Administered 2015-06-04: 09:00:00 via INTRAVENOUS

## 2015-06-04 MED ORDER — MEPERIDINE HCL 100 MG/ML IJ SOLN
INTRAMUSCULAR | Status: DC | PRN
  Administered 2015-06-04 (×2): 25 mg via INTRAVENOUS
  Administered 2015-06-04: 50 mg via INTRAVENOUS

## 2015-06-04 NOTE — Op Note (Signed)
[facility] [address] [place], [address]   COLONOSCOPY PROCEDURE REPORT  PATIENT: Michelle Landry, Michelle Landry  MR#: [id number] BIRTHDATE: December 13, 1955 , 59  yrs. old GENDER: female ENDOSCOPIST: Danie Binder, MD REFERRED [id number] Wolfgang Phoenix, M.D. PROCEDURE DATE:  05-Jun-2015 PROCEDURE:   Colonoscopy with cold biopsy polypectomy INDICATIONS:high risk patient with personal history of colonic polyps.  LAST TCS 2006-[facility]J. MEDICATIONS: Demerol 100 mg IV and Versed 6 mg IV  DESCRIPTION OF PROCEDURE:    Physical exam was performed.  Informed consent was obtained from the patient after explaining the benefits, risks, and alternatives to procedure.  The patient was connected to monitor and placed in left lateral position. Continuous oxygen was provided by nasal cannula and IV medicine administered through an indwelling cannula.  After administration of sedation and rectal exam, the patients rectum was intubated and the [id number]  colonoscope was advanced under direct visualization to the ileum.  The scope was removed slowly by carefully examining the color, texture, anatomy, and integrity mucosa on the way out.  The patient was recovered in endoscopy and discharged home in satisfactory condition. Estimated blood loss is zero unless otherwise noted in this procedure report.    COLON FINDINGS: Two sessile polyps ranging from 3 to 35mm in size were found at the cecum.  A polypectomy was performed with cold forceps.  A polypectomy was performed with a cold snare.  ACTIVE BLEEDING AFTER COLD SNARE. Bleeding at the site was controlled using hemoclip(1). The examination was otherwise normal.  LARGE external and SMALL internal hemorrhoids were found.  PREP QUALITY: good.  CECAL W/D TIME: 13       minutes COMPLICATIONS: None  ENDOSCOPIC IMPRESSION: 1.   Two COLON polyps REMOVED 2.   External and internal hemorrhoids  RECOMMENDATIONS: NO MRI FOR 30 DAYS. FOLLOW A HIGH FIBER  DIET. AWAIT BIOPSY RESULTS. Next colonoscopy in 5-10 years.      _______________________________ eSignedDanie Binder, MD June 05, 2015 10:10 AM   CPT CODES: ICD CODES:  The ICD and CPT codes recommended by this software are interpretations from the data that the clinical staff has captured with the software.  The verification of the translation of this report to the ICD and CPT codes and modifiers is the sole responsibility of the health care institution and practicing physician where this report was generated.  [employer]. will not be held responsible for the validity of the ICD and CPT codes included on this report.  AMA assumes no liability for data contained or not contained herein. CPT is a Designer, television/film set of the [employer].

## 2015-06-04 NOTE — Discharge Instructions (Signed)
You had 2 polyps removed. I PLACED A CLIP TO PREVENT BLEEDING IN 7-10 DAYS. You have small internal and large external hemorrhoids.  NO MRI FOR 30 DAYS.  FOLLOW A HIGH FIBER DIET. AVOID ITEMS THAT CAUSE BLOATING & GAS. SEE INFO BELOW.  YOUR BIOPSY RESULTS WILL BE AVAILABLE IN MY CHART DEC 30 AND MY OFFICE WILL CONTACT YOU IN 10-14 DAYS WITH YOUR RESULTS.   Next colonoscopy in 5-10 years.    Colonoscopy Care After Read the instructions outlined below and refer to this sheet in the next week. These discharge instructions provide you with general information on caring for yourself after you leave the hospital. While your treatment has been planned according to the most current medical practices available, unavoidable complications occasionally occur. If you have any problems or questions after discharge, call DR. Eleftherios Dudenhoeffer, [phone].  ACTIVITY  You may resume your regular activity, but move at a slower pace for the next 24 hours.   Take frequent rest periods for the next 24 hours.   Walking will help get rid of the air and reduce the bloated feeling in your belly (abdomen).   No driving for 24 hours (because of the medicine (anesthesia) used during the test).   You may shower.   Do not sign any important legal documents or operate any machinery for 24 hours (because of the anesthesia used during the test).    NUTRITION  Drink plenty of fluids.   You may resume your normal diet as instructed by your doctor.   Begin with a light meal and progress to your normal diet. Heavy or fried foods are harder to digest and may make you feel sick to your stomach (nauseated).   Avoid alcoholic beverages for 24 hours or as instructed.    MEDICATIONS  You may resume your normal medications.   WHAT YOU CAN EXPECT TODAY  Some feelings of bloating in the abdomen.   Passage of more gas than usual.   Spotting of blood in your stool or on the toilet paper  .  IF YOU HAD POLYPS REMOVED  DURING THE COLONOSCOPY:  Eat a soft diet IF YOU HAVE NAUSEA, BLOATING, ABDOMINAL PAIN, OR VOMITING.    FINDING OUT THE RESULTS OF YOUR TEST Not all test results are available during your visit. DR. Oneida Alar WILL CALL YOU WITHIN 14 DAYS OF YOUR PROCEDUE WITH YOUR RESULTS. Do not assume everything is normal if you have not heard from DR. Zaryia Markel, CALL HER OFFICE AT [phone].  SEEK IMMEDIATE MEDICAL ATTENTION AND CALL THE OFFICE: [phone] IF:  You have more than a spotting of blood in your stool.   Your belly is swollen (abdominal distention).   You are nauseated or vomiting.   You have a temperature over 101F.   You have abdominal pain or discomfort that is severe or gets worse throughout the day.   High-Fiber Diet A high-fiber diet changes your normal diet to include more whole grains, legumes, fruits, and vegetables. Changes in the diet involve replacing refined carbohydrates with unrefined foods. The calorie level of the diet is essentially unchanged. The Dietary Reference Intake (recommended amount) for adult males is 38 grams per day. For adult females, it is 25 grams per day. Pregnant and lactating women should consume 28 grams of fiber per day. Fiber is the intact part of a plant that is not broken down during digestion. Functional fiber is fiber that has been isolated from the plant to provide a beneficial effect in the body.  PURPOSE  Increase stool bulk.   Ease and regulate bowel movements.   Lower cholesterol.  REDUCE RISK OF COLON CANCER  INDICATIONS THAT YOU NEED MORE FIBER  Constipation and hemorrhoids.   Uncomplicated diverticulosis (intestine condition) and irritable bowel syndrome.   Weight management.   As a protective measure against hardening of the arteries (atherosclerosis), diabetes, and cancer.   GUIDELINES FOR INCREASING FIBER IN THE DIET  Start adding fiber to the diet slowly. A gradual increase of about 5 more grams (2 slices of whole-wheat  bread, 2 servings of most fruits or vegetables, or 1 bowl of high-fiber cereal) per day is best. Too rapid an increase in fiber may result in constipation, flatulence, and bloating.   Drink enough water and fluids to keep your urine clear or pale yellow. Water, juice, or caffeine-free drinks are recommended. Not drinking enough fluid may cause constipation.   Eat a variety of high-fiber foods rather than one type of fiber.   Try to increase your intake of fiber through using high-fiber foods rather than fiber pills or supplements that contain small amounts of fiber.   The goal is to change the types of food eaten. Do not supplement your present diet with high-fiber foods, but replace foods in your present diet.  INCLUDE A VARIETY OF FIBER SOURCES  Replace refined and processed grains with whole grains, canned fruits with fresh fruits, and incorporate other fiber sources. White rice, white breads, and most bakery goods contain little or no fiber.   Brown whole-grain rice, buckwheat oats, and many fruits and vegetables are all good sources of fiber. These include: broccoli, Brussels sprouts, cabbage, cauliflower, beets, sweet potatoes, white potatoes (skin on), carrots, tomatoes, eggplant, squash, berries, fresh fruits, and dried fruits.   Cereals appear to be the richest source of fiber. Cereal fiber is found in whole grains and bran. Bran is the fiber-rich outer coat of cereal grain, which is largely removed in refining. In whole-grain cereals, the bran remains. In breakfast cereals, the largest amount of fiber is found in those with "bran" in their names. The fiber content is sometimes indicated on the label.   You may need to include additional fruits and vegetables each day.   In baking, for 1 cup white flour, you may use the following substitutions:   1 cup whole-wheat flour minus 2 tablespoons.   1/2 cup white flour plus 1/2 cup whole-wheat flour.   Polyps, Colon  A polyp is extra  tissue that grows inside your body. Colon polyps grow in the large intestine. The large intestine, also called the colon, is part of your digestive system. It is a long, hollow tube at the end of your digestive tract where your body makes and stores stool. Most polyps are not dangerous. They are benign. This means they are not cancerous. But over time, some types of polyps can turn into cancer. Polyps that are smaller than a pea are usually not harmful. But larger polyps could someday become or may already be cancerous. To be safe, doctors remove all polyps and test them.   PREVENTION There is not one sure way to prevent polyps. You might be able to lower your risk of getting them if you:  Eat more fruits and vegetables and less fatty food.   Do not smoke.   Avoid alcohol.   Exercise every day.   Lose weight if you are overweight.   Eating more calcium and folate can also lower your risk of getting polyps.  Some foods that are rich in calcium are milk, cheese, and broccoli. Some foods that are rich in folate are chickpeas, kidney beans, and spinach.   Hemorrhoids Hemorrhoids are dilated (enlarged) veins around the rectum. Sometimes clots will form in the veins. This makes them swollen and painful. These are called thrombosed hemorrhoids. Causes of hemorrhoids include:  Constipation.   Straining to have a bowel movement.   HEAVY LIFTING   HOME CARE INSTRUCTIONS  Eat a well balanced diet and drink 6 to 8 glasses of water every day to avoid constipation. You may also use a bulk laxative.   Avoid straining to have bowel movements.   Keep anal area dry and clean.   Do not use a donut shaped pillow or sit on the toilet for long periods. This increases blood pooling and pain.   Move your bowels when your body has the urge; this will require less straining and will decrease pain and pressure.

## 2015-06-04 NOTE — H&P (Signed)
  Primary Care Physician:  Mickie Hillier, MD Primary Gastroenterologist:  Dr. Oneida Alar  Pre-Procedure History & Physical: HPI:  Michelle Landry is a 59 y.o. female here for [place].  Past Medical History  Diagnosis Date  . Hypertension   . Allergic rhinitis   . Osteoporosis 2012  . History of bronchitis     Past Surgical History  Procedure Laterality Date  . Colonoscopy    . 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 Right knee replacement  12/2010    Prior to Admission medications   Medication Sig Start Date End Date Taking? Authorizing Provider  citalopram (CELEXA) 20 MG tablet TAKE 1 AND 1/2 TABLETS BY MOUTH NIGHTLY AT BEDTIME. 04/22/15  Yes Mikey Kirschner, MD  ibandronate (BONIVA) 150 MG tablet TAKE (1) TABLET ONCE A MONTH ONLY. TAKE WITH 6 TO 8 OZ OF WATER AND DO NOT LAY DOWN FOR 1 HOUR. 04/07/15  Yes Nilda Simmer, NP  indapamide (LOZOL) 1.25 MG tablet TAKE ONE TABLET BY MOUTH EACH MORNING FOR BLOOD PRESSURE AND FLUID. 05/21/15  Yes Nilda Simmer, NP  lisinopril (PRINIVIL,ZESTRIL) 20 MG tablet TAKE ONE TABLET BY MOUTH EVERY MORNING. 05/21/15  Yes Nilda Simmer, NP  potassium chloride (K-DUR) 10 MEQ tablet TAKE ONE TABLET BY MOUTH DAILY. 05/21/15  Yes Nilda Simmer, NP  ranitidine (ZANTAC) 150 MG tablet TAKE ONE TABLET BY MOUTH TWICE DAILY AS NEEDED FOR ACID REFLUX. 05/21/15  Yes Nilda Simmer, NP  Sod Picosulfate-Mag Ox-Cit Acd 10-3.5-12 MG-GM-GM PACK Take 1 Container by mouth as directed. 05/08/15  Yes Danie Binder, MD  albuterol (PROVENTIL HFA;VENTOLIN HFA) 108 (90 BASE) MCG/ACT inhaler Inhale 2 puffs into the lungs every 4 (four) hours as needed. 07/08/14   Nilda Simmer, NP    Allergies as of 05/08/2015 - Review Complete 05/07/2015  Allergen Reaction Noted  . Sulfa antibiotics  11/30/2012    Family History  Problem Relation Age of Onset  . Hypertension Sister   . Diabetes Sister   . Heart disease Sister   . Hypertension Mother   .  Osteoporosis Mother   . Heart disease Father 58    MI  . Cancer Sister     breast    Social History   Social History  . Marital Status: Single    Spouse Name: N/A  . Number of Children: N/A  . Years of Education: N/A   Occupational History  . Not on file.   Social History Main Topics  . Smoking status: Never Smoker   . Smokeless tobacco: Never Used  . Alcohol Use: No  . Drug Use: No  . Sexual Activity: No   Other Topics Concern  . Not on file   Social History Narrative    Review of Systems: See HPI, otherwise negative ROS   Physical Exam: BP 127/70 mmHg  Pulse 61  Temp(Src) 97.8 F (36.6 C) (Oral)  Resp 15  Ht 5' 6.5" (1.689 m)  Wt 165 lb (74.844 kg)  BMI 26.24 kg/m2  SpO2 97% General:   Alert,  pleasant and cooperative in NAD Head:  Normocephalic and atraumatic. Neck:  Supple; Lungs:  Clear throughout to auscultation.    Heart:  Regular rate and rhythm. Abdomen:  Soft, nontender and nondistended. Normal bowel sounds, without guarding, and without rebound.   Neurologic:  Alert and  oriented x4;  grossly normal neurologically.  Impression/Plan:    SCREENING  Plan:  1. TCS TODAY

## 2015-06-10 ENCOUNTER — Encounter (HOSPITAL_COMMUNITY): Payer: Self-pay | Admitting: Gastroenterology

## 2015-06-12 ENCOUNTER — Telehealth: Payer: Self-pay | Admitting: Gastroenterology

## 2015-06-12 NOTE — Telephone Encounter (Signed)
Reminder in epic °

## 2015-06-12 NOTE — Telephone Encounter (Signed)
Pt is aware.  

## 2015-06-12 NOTE — Telephone Encounter (Signed)
Please call pt. She had TWO simple adenomas removed from her colon.    NO MRI UNTIL JAN 29.  FOLLOW A HIGH FIBER DIET. AVOID ITEMS THAT CAUSE BLOATING & GAS.   Next colonoscopy in 5 years DUE TO A PERSONAL HISTORY OF POLYPS.

## 2015-07-07 ENCOUNTER — Other Ambulatory Visit: Payer: Self-pay | Admitting: Nurse Practitioner

## 2015-07-30 ENCOUNTER — Telehealth: Payer: Self-pay | Admitting: Family Medicine

## 2015-07-30 NOTE — Telephone Encounter (Signed)
FYI, patient is letting us know that she is now using [facility] for her pharmacy They will be sending over some scripts soon

## 2015-07-31 ENCOUNTER — Other Ambulatory Visit: Payer: Self-pay | Admitting: *Deleted

## 2015-07-31 MED ORDER — CITALOPRAM HYDROBROMIDE 20 MG PO TABS: ORAL_TABLET | ORAL | Status: DC

## 2015-08-07 ENCOUNTER — Telehealth: Payer: Self-pay | Admitting: Family Medicine

## 2015-08-07 MED ORDER — INDAPAMIDE 1.25 MG PO TABS: ORAL_TABLET | ORAL | Status: DC

## 2015-08-07 MED ORDER — IBANDRONATE SODIUM 150 MG PO TABS: ORAL_TABLET | ORAL | Status: DC

## 2015-08-07 MED ORDER — POTASSIUM CHLORIDE ER 10 MEQ PO TBCR: 10.0000 meq | EXTENDED_RELEASE_TABLET | Freq: Every day | ORAL | Status: DC

## 2015-08-07 MED ORDER — LISINOPRIL 20 MG PO TABS: 20.0000 mg | ORAL_TABLET | Freq: Every morning | ORAL | Status: DC

## 2015-08-07 NOTE — Telephone Encounter (Signed)
ibandronate (BONIVA) 150 MG tablet potassium chloride (K-DUR) 10 MEQ tablet  lisinopril (PRINIVIL,ZESTRIL) 20 MG tablet  indapamide (LOZOL) 1.25 MG tablet   Pt would like to have all these scripts sent to [facility]  She is changing to them as her pharmacy

## 2015-08-07 NOTE — Telephone Encounter (Signed)
[facility]

## 2015-08-07 NOTE — Telephone Encounter (Signed)
Left message on voicemail notifying patient that meds were sent to pharmacy.

## 2015-08-15 ENCOUNTER — Other Ambulatory Visit: Payer: Self-pay | Admitting: Nurse Practitioner

## 2015-08-27 ENCOUNTER — Other Ambulatory Visit: Payer: Self-pay | Admitting: Family Medicine

## 2015-08-27 DIAGNOSIS — Z1231 Encounter for screening mammogram for malignant neoplasm of breast: Secondary | ICD-10-CM

## 2015-09-29 ENCOUNTER — Ambulatory Visit (HOSPITAL_COMMUNITY): Payer: Medicare HMO

## 2015-10-01 ENCOUNTER — Ambulatory Visit (HOSPITAL_COMMUNITY): Payer: Medicare HMO

## 2015-10-01 ENCOUNTER — Ambulatory Visit (HOSPITAL_COMMUNITY)
Admission: RE | Admit: 2015-10-01 | Discharge: 2015-10-01 | Disposition: A | Discharge status: Home / Self Care | Payer: Medicare HMO | Source: Ambulatory Visit | Attending: Family Medicine | Admitting: Family Medicine

## 2015-10-01 ENCOUNTER — Other Ambulatory Visit: Payer: Self-pay | Admitting: Family Medicine

## 2015-10-01 DIAGNOSIS — Z1231 Encounter for screening mammogram for malignant neoplasm of breast: Secondary | ICD-10-CM

## 2015-10-27 ENCOUNTER — Other Ambulatory Visit: Payer: Self-pay | Admitting: Family Medicine

## 2015-10-28 ENCOUNTER — Ambulatory Visit: Payer: Medicare HMO | Admitting: Nurse Practitioner

## 2015-10-30 ENCOUNTER — Ambulatory Visit (INDEPENDENT_AMBULATORY_CARE_PROVIDER_SITE_OTHER): Payer: Medicare HMO | Admitting: Nurse Practitioner

## 2015-10-30 ENCOUNTER — Encounter: Payer: Self-pay | Admitting: Nurse Practitioner

## 2015-10-30 VITALS — BP 126/84 | Ht 66.5 in | Wt 168.2 lb

## 2015-10-30 DIAGNOSIS — I10 Essential (primary) hypertension: Secondary | ICD-10-CM | POA: Diagnosis not present

## 2015-10-30 DIAGNOSIS — M1990 Unspecified osteoarthritis, unspecified site: Secondary | ICD-10-CM | POA: Insufficient documentation

## 2015-10-30 DIAGNOSIS — M159 Polyosteoarthritis, unspecified: Secondary | ICD-10-CM | POA: Diagnosis not present

## 2015-10-30 DIAGNOSIS — J300 Vasomotor rhinitis: Secondary | ICD-10-CM | POA: Diagnosis not present

## 2015-10-30 DIAGNOSIS — F418 Other specified anxiety disorders: Secondary | ICD-10-CM

## 2015-10-30 DIAGNOSIS — R69 Illness, unspecified: Secondary | ICD-10-CM | POA: Diagnosis not present

## 2015-10-30 MED ORDER — INDAPAMIDE 1.25 MG PO TABS: ORAL_TABLET | ORAL | Status: DC

## 2015-10-30 MED ORDER — POTASSIUM CHLORIDE ER 10 MEQ PO TBCR: 10.0000 meq | EXTENDED_RELEASE_TABLET | Freq: Every day | ORAL | Status: DC

## 2015-10-30 MED ORDER — IBANDRONATE SODIUM 150 MG PO TABS: ORAL_TABLET | ORAL | Status: DC

## 2015-10-30 MED ORDER — LISINOPRIL 20 MG PO TABS: 20.0000 mg | ORAL_TABLET | Freq: Every morning | ORAL | Status: DC

## 2015-10-30 MED ORDER — CITALOPRAM HYDROBROMIDE 20 MG PO TABS: ORAL_TABLET | ORAL | Status: DC

## 2015-10-30 NOTE — Progress Notes (Signed)
Subjective:  Presents for routine follow-up on her blood pressure. Staying active, walking daily. Weight has been stable. No added salt her diet. No chest pain/ischemic type pain or shortness of breath. Depression and anxiety stable on Celexa. Continues Boniva for her osteoporosis. Also complaints of right ear pressure for the past few days. Occasional pain. No fever sore throat or cough. Occasional mild headache. Taking Allegra daily. Off-and-on joint pain particularly in the low back hips and knees, has a history of osteoarthritis. No redness or significant edema noted in the joints.  Objective:   BP 126/84 mmHg  Ht 5' 6.5" (1.689 m)  Wt 168 lb 3.2 oz (76.295 kg)  BMI 26.74 kg/m2 NAD. Alert, oriented. TMs clear effusion, no erythema. Pharynx minimally injected. Neck supple with mild soft anterior adenopathy. Lungs clear. Heart regular rhythm. Lower extremities no edema. Significant osteoarthritis changes noted in all of her fingers.  Assessment:  Problem List Items Addressed This Visit      Cardiovascular and Mediastinum   Hypertension - Primary   Relevant Medications   indapamide (LOZOL) 1.25 MG tablet   lisinopril (PRINIVIL,ZESTRIL) 20 MG tablet     Musculoskeletal and Integument   Osteoarthritis      Other   Depression with anxiety    Other Visit Diagnoses    Vasomotor rhinitis           Plan:  Meds ordered this encounter  Medications  . citalopram (CELEXA) 20 MG tablet    Sig: TAKE ONE & ONE-HALF TABLETS BY MOUTH NIGHTLY AT BEDTIME    Dispense:  135 tablet    Refill:  1    Order Specific Question:  Supervising Provider    Answer:  Mikey Kirschner [[id number]]  . ibandronate (BONIVA) 150 MG tablet    Sig: TAKE (1) TABLET ONCE A MONTH ONLY. TAKE WITH 6 TO 8 OZ OF WATER AND DO NOT LAY DOWN FOR 1 HOUR.    Dispense:  12 tablet    Refill:  0    Order Specific Question:  Supervising Provider    Answer:  Mikey Kirschner [[id number]]  . indapamide (LOZOL) 1.25 MG tablet    Sig: TAKE  ONE TABLET BY MOUTH EACH MORNING FOR BLOOD PRESSURE AND FLUID.    Dispense:  90 tablet    Refill:  1    Order Specific Question:  Supervising Provider    Answer:  Mikey Kirschner [[id number]]  . lisinopril (PRINIVIL,ZESTRIL) 20 MG tablet    Sig: Take 1 tablet (20 mg total) by mouth every morning.    Dispense:  90 tablet    Refill:  1    Order Specific Question:  Supervising Provider    Answer:  Mikey Kirschner [[id number]]  . potassium chloride (K-DUR) 10 MEQ tablet    Sig: Take 1 tablet (10 mEq total) by mouth daily.    Dispense:  90 tablet    Refill:  1    Order Specific Question:  Supervising Provider    Answer:  Mikey Kirschner [[id number]]   Recommend continued activity, discussed pain control. Continue current medications as directed. No further workup needed at this time. Return in about 6 months (around 05/01/2016) for physical.

## 2016-04-16 ENCOUNTER — Telehealth: Payer: Self-pay | Admitting: Family Medicine

## 2016-04-16 ENCOUNTER — Ambulatory Visit (INDEPENDENT_AMBULATORY_CARE_PROVIDER_SITE_OTHER): Payer: Medicare HMO

## 2016-04-16 DIAGNOSIS — I10 Essential (primary) hypertension: Secondary | ICD-10-CM

## 2016-04-16 DIAGNOSIS — Z23 Encounter for immunization: Secondary | ICD-10-CM | POA: Diagnosis not present

## 2016-04-16 DIAGNOSIS — Z79899 Other long term (current) drug therapy: Secondary | ICD-10-CM

## 2016-04-16 NOTE — Telephone Encounter (Signed)
Notified patient bloodwork has been ordered.  

## 2016-04-16 NOTE — Telephone Encounter (Signed)
Requesting blood work to be ordered for upcoming appointment.

## 2016-04-16 NOTE — Telephone Encounter (Signed)
Lip liv m7 

## 2016-04-19 DIAGNOSIS — I10 Essential (primary) hypertension: Secondary | ICD-10-CM | POA: Diagnosis not present

## 2016-04-19 DIAGNOSIS — Z79899 Other long term (current) drug therapy: Secondary | ICD-10-CM | POA: Diagnosis not present

## 2016-04-20 LAB — HEPATIC FUNCTION PANEL
ALBUMIN: 4.3 g/dL (ref 3.6–4.8)
ALK PHOS: 69 IU/L (ref 39–117)
ALT: 16 IU/L (ref 0–32)
AST: 15 IU/L (ref 0–40)
BILIRUBIN, DIRECT: 0.14 mg/dL (ref 0.00–0.40)
Bilirubin Total: 0.5 mg/dL (ref 0.0–1.2)
TOTAL PROTEIN: 6.5 g/dL (ref 6.0–8.5)

## 2016-04-20 LAB — LIPID PANEL
CHOLESTEROL TOTAL: 179 mg/dL (ref 100–199)
Chol/HDL Ratio: 4.2 ratio units (ref 0.0–4.4)
HDL: 43 mg/dL (ref >39)
LDL Calculated: 112 mg/dL — ABNORMAL HIGH (ref 0–99)
Triglycerides: 121 mg/dL (ref 0–149)
VLDL Cholesterol Cal: 24 mg/dL (ref 5–40)

## 2016-04-20 LAB — BASIC METABOLIC PANEL
BUN / CREAT RATIO: 16 (ref 12–28)
BUN: 11 mg/dL (ref 8–27)
CO2: 26 mmol/L (ref 18–29)
CREATININE: 0.68 mg/dL (ref 0.57–1.00)
Calcium: 9.4 mg/dL (ref 8.7–10.3)
Chloride: 101 mmol/L (ref 96–106)
GFR calc Af Amer: 109 mL/min/{1.73_m2} (ref >59)
GFR, EST NON AFRICAN AMERICAN: 95 mL/min/{1.73_m2} (ref >59)
GLUCOSE: 93 mg/dL (ref 65–99)
POTASSIUM: 3.7 mmol/L (ref 3.5–5.2)
SODIUM: 144 mmol/L (ref 134–144)

## 2016-05-05 ENCOUNTER — Ambulatory Visit (INDEPENDENT_AMBULATORY_CARE_PROVIDER_SITE_OTHER): Payer: Medicare HMO | Admitting: Nurse Practitioner

## 2016-05-05 ENCOUNTER — Encounter: Payer: Self-pay | Admitting: Nurse Practitioner

## 2016-05-05 VITALS — BP 122/80 | Ht 66.0 in | Wt 170.4 lb

## 2016-05-05 DIAGNOSIS — Z0000 Encounter for general adult medical examination without abnormal findings: Secondary | ICD-10-CM

## 2016-05-05 MED ORDER — ALENDRONATE SODIUM 70 MG PO TABS: 70.0000 mg | ORAL_TABLET | ORAL | 11 refills | Status: DC

## 2016-05-05 MED ORDER — CITALOPRAM HYDROBROMIDE 20 MG PO TABS: ORAL_TABLET | ORAL | 1 refills | Status: DC

## 2016-05-05 MED ORDER — POTASSIUM CHLORIDE ER 10 MEQ PO TBCR: 10.0000 meq | EXTENDED_RELEASE_TABLET | Freq: Every day | ORAL | 1 refills | Status: DC

## 2016-05-05 MED ORDER — INDAPAMIDE 1.25 MG PO TABS
ORAL_TABLET | ORAL | 1 refills | Status: DC
Start: 2016-05-05 — End: 2016-10-29

## 2016-05-05 MED ORDER — LISINOPRIL 20 MG PO TABS: 20.0000 mg | ORAL_TABLET | Freq: Every morning | ORAL | 1 refills | Status: DC

## 2016-05-05 NOTE — Progress Notes (Addendum)
Subjective:    Patient ID: Michelle Landry, female    DOB: 11-16-1955, 60 y.o.   MRN: [id number]  HPI presents for her wellness exam. No history of sexual activity. No pelvic pain or vaginal bleeding. Needs eye exam. Regular dental care. Wants to switch to Fosamax due to cost.     Review of Systems  Constitutional: Negative for activity change, appetite change and fatigue.  HENT: Negative for dental problem, ear pain, sinus pressure and sore throat.   Respiratory: Negative for cough, chest tightness, shortness of breath and wheezing.   Cardiovascular: Negative for chest pain.  Gastrointestinal: Negative for abdominal distention, abdominal pain, constipation, diarrhea, nausea and vomiting.  Genitourinary: Negative for difficulty urinating, dysuria, enuresis, frequency, genital sores, pelvic pain, urgency, vaginal bleeding and vaginal discharge.       Objective:   Physical Exam  Constitutional: She is oriented to person, place, and time. She appears well-developed. No distress.  HENT:  Right Ear: External ear normal.  Left Ear: External ear normal.  Mouth/Throat: Oropharynx is clear and moist.  Neck: Normal range of motion. Neck supple. No tracheal deviation present. No thyromegaly present.  Cardiovascular: Normal rate, regular rhythm and normal heart sounds.  Exam reveals no gallop.   No murmur heard. Pulmonary/Chest: Effort normal and breath sounds normal.  Abdominal: Soft. She exhibits no distension. There is no tenderness.  Genitourinary:  Genitourinary Comments: Defers GU and rectal exams. Denies any problems. Understands that evaluation of ovaries can be limited otherwise.   Musculoskeletal: She exhibits no edema.  Lymphadenopathy:    She has no cervical adenopathy.  Neurological: She is alert and oriented to person, place, and time.  Skin: Skin is warm and dry. No rash noted.  Psychiatric: She has a normal mood and affect. Her behavior is normal.  Vitals reviewed. Breast  exam: no masses; axillae no adenopathy.  Reviewed labs from 04/19/16       Assessment & Plan:  Routine general medical examination at a health care facility  Encouraged continued activity and healthy diet.  Meds ordered this encounter  Medications  . alendronate (FOSAMAX) 70 MG tablet    Sig: Take 1 tablet (70 mg total) by mouth once a week. Take with a full glass of water on an empty stomach.    Dispense:  4 tablet    Refill:  11    Order Specific Question:   Supervising Provider    Answer:   Mikey Kirschner [[id number]]  . citalopram (CELEXA) 20 MG tablet    Sig: TAKE ONE & ONE-HALF TABLETS BY MOUTH NIGHTLY AT BEDTIME    Dispense:  135 tablet    Refill:  1    Order Specific Question:   Supervising Provider    Answer:   Mikey Kirschner [[id number]]  . indapamide (LOZOL) 1.25 MG tablet    Sig: TAKE ONE TABLET BY MOUTH EACH MORNING FOR BLOOD PRESSURE AND FLUID.    Dispense:  90 tablet    Refill:  1    Order Specific Question:   Supervising Provider    Answer:   Mikey Kirschner [[id number]]  . lisinopril (PRINIVIL,ZESTRIL) 20 MG tablet    Sig: Take 1 tablet (20 mg total) by mouth every morning.    Dispense:  90 tablet    Refill:  1    Order Specific Question:   Supervising Provider    Answer:   Mikey Kirschner [[id number]]  . potassium chloride (K-DUR) 10 MEQ tablet    Sig:  Take 1 tablet (10 mEq total) by mouth daily.    Dispense:  90 tablet    Refill:  1    Order Specific Question:   Supervising Provider    Answer:   Mikey Kirschner [[id number]]   Switch to Fosamax as directed. Call back if any problems.  Return in about 6 months (around 11/02/2016) for recheck.

## 2016-08-20 ENCOUNTER — Other Ambulatory Visit: Payer: Self-pay | Admitting: Family Medicine

## 2016-08-20 DIAGNOSIS — Z1231 Encounter for screening mammogram for malignant neoplasm of breast: Secondary | ICD-10-CM

## 2016-10-01 ENCOUNTER — Ambulatory Visit (HOSPITAL_COMMUNITY)
Admission: RE | Admit: 2016-10-01 | Discharge: 2016-10-01 | Disposition: A | Discharge status: Home / Self Care | Payer: Medicare HMO | Source: Ambulatory Visit | Attending: Family Medicine | Admitting: Family Medicine

## 2016-10-01 DIAGNOSIS — Z1231 Encounter for screening mammogram for malignant neoplasm of breast: Secondary | ICD-10-CM | POA: Diagnosis not present

## 2016-10-29 ENCOUNTER — Ambulatory Visit (INDEPENDENT_AMBULATORY_CARE_PROVIDER_SITE_OTHER): Payer: Medicare HMO | Admitting: Nurse Practitioner

## 2016-10-29 ENCOUNTER — Encounter: Payer: Self-pay | Admitting: Nurse Practitioner

## 2016-10-29 VITALS — BP 116/72 | Ht 66.0 in | Wt 169.2 lb

## 2016-10-29 DIAGNOSIS — M810 Age-related osteoporosis without current pathological fracture: Secondary | ICD-10-CM | POA: Diagnosis not present

## 2016-10-29 DIAGNOSIS — I10 Essential (primary) hypertension: Secondary | ICD-10-CM | POA: Diagnosis not present

## 2016-10-29 DIAGNOSIS — R69 Illness, unspecified: Secondary | ICD-10-CM | POA: Diagnosis not present

## 2016-10-29 DIAGNOSIS — F418 Other specified anxiety disorders: Secondary | ICD-10-CM | POA: Diagnosis not present

## 2016-10-29 MED ORDER — POTASSIUM CHLORIDE ER 10 MEQ PO TBCR: 10.0000 meq | EXTENDED_RELEASE_TABLET | Freq: Every day | ORAL | 1 refills | Status: DC

## 2016-10-29 MED ORDER — CITALOPRAM HYDROBROMIDE 20 MG PO TABS: ORAL_TABLET | ORAL | 1 refills | Status: DC

## 2016-10-29 MED ORDER — LISINOPRIL 20 MG PO TABS: 20.0000 mg | ORAL_TABLET | Freq: Every morning | ORAL | 1 refills | Status: DC

## 2016-10-29 MED ORDER — INDAPAMIDE 1.25 MG PO TABS: ORAL_TABLET | ORAL | 1 refills | Status: DC

## 2016-10-30 ENCOUNTER — Encounter: Payer: Self-pay | Admitting: Nurse Practitioner

## 2016-10-30 NOTE — Progress Notes (Signed)
Subjective:  Presents for routine follow up on chronic conditions. Active. Overall healthy diet. No CP/ischemic type pain or SOB. Compliant with medications. Celexa working well. Taking daily vitamin D and calcium along with fosamax.   Objective:   BP 116/72   Ht 5\' 6"  (1.676 m)   Wt 169 lb 3.2 oz (76.7 kg)   BMI 27.31 kg/m  NAD. Alert, oriented. Lungs clear. Heart RRR. Lower extremities no edema.   Assessment:   Problem List Items Addressed This Visit      Cardiovascular and Mediastinum   Hypertension - Primary   Relevant Medications   indapamide (LOZOL) 1.25 MG tablet   lisinopril (PRINIVIL,ZESTRIL) 20 MG tablet     Musculoskeletal and Integument   Osteoporosis     Other   Depression with anxiety       Plan:   Meds ordered this encounter  Medications  . citalopram (CELEXA) 20 MG tablet    Sig: TAKE ONE & ONE-HALF TABLETS BY MOUTH NIGHTLY AT BEDTIME    Dispense:  135 tablet    Refill:  1    Order Specific Question:   Supervising Provider    Answer:   Mikey Kirschner [[id number]]  . indapamide (LOZOL) 1.25 MG tablet    Sig: TAKE ONE TABLET BY MOUTH EACH MORNING FOR BLOOD PRESSURE AND FLUID.    Dispense:  90 tablet    Refill:  1    Order Specific Question:   Supervising Provider    Answer:   Mikey Kirschner [[id number]]  . lisinopril (PRINIVIL,ZESTRIL) 20 MG tablet    Sig: Take 1 tablet (20 mg total) by mouth every morning.    Dispense:  90 tablet    Refill:  1    Order Specific Question:   Supervising Provider    Answer:   Mikey Kirschner [[id number]]  . potassium chloride (K-DUR) 10 MEQ tablet    Sig: Take 1 tablet (10 mEq total) by mouth daily.    Dispense:  90 tablet    Refill:  1    Order Specific Question:   Supervising Provider    Answer:   Mikey Kirschner [[id number]]    Encouraged activity as tolerated.  Return in about 6 months (around 05/01/2017) for physical; after 11/29. Repeat labs at that time.

## 2016-11-09 DIAGNOSIS — R69 Illness, unspecified: Secondary | ICD-10-CM | POA: Diagnosis not present

## 2017-03-28 ENCOUNTER — Encounter: Payer: Self-pay | Admitting: Family Medicine

## 2017-03-28 ENCOUNTER — Ambulatory Visit (HOSPITAL_COMMUNITY)
Admission: RE | Admit: 2017-03-28 | Discharge: 2017-03-28 | Disposition: A | Discharge status: Home / Self Care | Payer: Medicare HMO | Source: Ambulatory Visit | Attending: Family Medicine | Admitting: Family Medicine

## 2017-03-28 ENCOUNTER — Ambulatory Visit (INDEPENDENT_AMBULATORY_CARE_PROVIDER_SITE_OTHER): Payer: Medicare HMO | Admitting: Family Medicine

## 2017-03-28 VITALS — BP 132/88 | Ht 66.0 in

## 2017-03-28 DIAGNOSIS — Z23 Encounter for immunization: Secondary | ICD-10-CM

## 2017-03-28 DIAGNOSIS — M79672 Pain in left foot: Secondary | ICD-10-CM | POA: Insufficient documentation

## 2017-03-28 DIAGNOSIS — M7989 Other specified soft tissue disorders: Secondary | ICD-10-CM | POA: Diagnosis not present

## 2017-03-28 DIAGNOSIS — M19072 Primary osteoarthritis, left ankle and foot: Secondary | ICD-10-CM | POA: Diagnosis not present

## 2017-03-28 MED ORDER — ETODOLAC 400 MG PO TABS: 400.0000 mg | ORAL_TABLET | Freq: Two times a day (BID) | ORAL | 0 refills | Status: DC

## 2017-03-28 NOTE — Progress Notes (Signed)
   Subjective:    Patient ID: Michelle Landry, female    DOB: Oct 19, 1955, 61 y.o.   MRN: [id number]  HPIleft foot pain and swelling. No injury. Started about one week ago. Tried ice and ace bandage.   Started last mon or tue  Got really bad this weekend    Lat base of fith metatarsal  Using motrin and tylenol   Takes two motrin prn   Doing more activity I the yard      Review of Systems No headache, no major weight loss or weight gain, no chest pain no back pain abdominal pain no change in bowel habits complete ROS otherwise negative     Objective:   Physical Exam Alert vitals stable, NAD. Blood pressure good on repeat. HEENT normal. Lungs clear. Heart regular rate and rhythm. Lateral foot tender to palpation.  Mild edema       Assessment & Plan:  Impression contusion versus fracture.  Addendum x-ray returned with a small extraosseous exostosis.  Likely old.  Therefore not associated with injury.  Symptom care discussed

## 2017-03-29 ENCOUNTER — Telehealth: Payer: Self-pay | Admitting: Family Medicine

## 2017-03-29 NOTE — Telephone Encounter (Signed)
Requesting results to foot x-ray.

## 2017-03-29 NOTE — Telephone Encounter (Signed)
Results discussed with patient. Patient advised x ray showed no new injuries. Radiologist sees changes which raises the question of an old (likely yrs ago) injury in this region with evidence of bone healing, this simply would not explain current pain. Joints of mid foot do reveal arthritis changes which can be consistent with flare of mid foot pain after exertion (Dr Richardson Landry had already told pt likely this) Patient verbalized understanding.

## 2017-04-25 ENCOUNTER — Telehealth: Payer: Self-pay | Admitting: Nurse Practitioner

## 2017-04-25 DIAGNOSIS — I10 Essential (primary) hypertension: Secondary | ICD-10-CM

## 2017-04-25 DIAGNOSIS — Z13220 Encounter for screening for lipoid disorders: Secondary | ICD-10-CM

## 2017-04-25 DIAGNOSIS — Z79899 Other long term (current) drug therapy: Secondary | ICD-10-CM

## 2017-04-25 NOTE — Telephone Encounter (Signed)
Lipid, liver, met 7

## 2017-04-25 NOTE — Telephone Encounter (Signed)
Blood work ordered in Epic. Patient notified. 

## 2017-04-25 NOTE — Telephone Encounter (Signed)
Patient has an appointment on 05/06/17 with Hoyle Sauer.  She would like labs ordered to complete tomorrow morning.

## 2017-04-26 DIAGNOSIS — I10 Essential (primary) hypertension: Secondary | ICD-10-CM | POA: Diagnosis not present

## 2017-04-26 DIAGNOSIS — Z13220 Encounter for screening for lipoid disorders: Secondary | ICD-10-CM | POA: Diagnosis not present

## 2017-04-26 DIAGNOSIS — Z79899 Other long term (current) drug therapy: Secondary | ICD-10-CM | POA: Diagnosis not present

## 2017-04-27 LAB — BASIC METABOLIC PANEL
BUN/Creatinine Ratio: 18 (ref 12–28)
BUN: 13 mg/dL (ref 8–27)
CALCIUM: 10 mg/dL (ref 8.7–10.3)
CO2: 26 mmol/L (ref 20–29)
CREATININE: 0.73 mg/dL (ref 0.57–1.00)
Chloride: 101 mmol/L (ref 96–106)
GFR calc Af Amer: 102 mL/min/{1.73_m2} (ref >59)
GFR, EST NON AFRICAN AMERICAN: 89 mL/min/{1.73_m2} (ref >59)
Glucose: 93 mg/dL (ref 65–99)
Potassium: 4.2 mmol/L (ref 3.5–5.2)
Sodium: 141 mmol/L (ref 134–144)

## 2017-04-27 LAB — HEPATIC FUNCTION PANEL
ALK PHOS: 80 IU/L (ref 39–117)
ALT: 22 IU/L (ref 0–32)
AST: 21 IU/L (ref 0–40)
Albumin: 4.3 g/dL (ref 3.6–4.8)
Bilirubin Total: 0.4 mg/dL (ref 0.0–1.2)
Bilirubin, Direct: 0.13 mg/dL (ref 0.00–0.40)
TOTAL PROTEIN: 6.6 g/dL (ref 6.0–8.5)

## 2017-04-27 LAB — LIPID PANEL
Chol/HDL Ratio: 3.3 ratio (ref 0.0–4.4)
Cholesterol, Total: 165 mg/dL (ref 100–199)
HDL: 50 mg/dL (ref >39)
LDL Calculated: 96 mg/dL (ref 0–99)
Triglycerides: 97 mg/dL (ref 0–149)
VLDL CHOLESTEROL CAL: 19 mg/dL (ref 5–40)

## 2017-05-05 ENCOUNTER — Other Ambulatory Visit: Payer: Self-pay | Admitting: Nurse Practitioner

## 2017-05-06 ENCOUNTER — Encounter: Payer: Self-pay | Admitting: Nurse Practitioner

## 2017-05-06 ENCOUNTER — Ambulatory Visit (INDEPENDENT_AMBULATORY_CARE_PROVIDER_SITE_OTHER): Payer: Medicare HMO | Admitting: Nurse Practitioner

## 2017-05-06 VITALS — BP 138/80 | Ht 66.0 in | Wt 168.2 lb

## 2017-05-06 DIAGNOSIS — Z780 Asymptomatic menopausal state: Secondary | ICD-10-CM | POA: Diagnosis not present

## 2017-05-06 DIAGNOSIS — Z1151 Encounter for screening for human papillomavirus (HPV): Secondary | ICD-10-CM

## 2017-05-06 DIAGNOSIS — M810 Age-related osteoporosis without current pathological fracture: Secondary | ICD-10-CM

## 2017-05-06 DIAGNOSIS — Z01419 Encounter for gynecological examination (general) (routine) without abnormal findings: Secondary | ICD-10-CM | POA: Diagnosis not present

## 2017-05-06 DIAGNOSIS — Z124 Encounter for screening for malignant neoplasm of cervix: Secondary | ICD-10-CM

## 2017-05-06 DIAGNOSIS — R69 Illness, unspecified: Secondary | ICD-10-CM | POA: Diagnosis not present

## 2017-05-06 NOTE — Progress Notes (Signed)
Subjective:    Patient ID: Michelle Landry, female    DOB: [birth year], 61 y.o.   MRN: [id number]  HPI presents for her wellness exam.  No vaginal bleeding or pelvic pain.  No history of sexual activity.  Regular vision and dental exams.  He takes daily vitamin D and calcium.  Does walking for activity.    Review of Systems  Constitutional: Negative for activity change, appetite change and fatigue.  HENT: Negative for dental problem, ear pain, sinus pressure and sore throat.   Respiratory: Negative for cough, chest tightness, shortness of breath and wheezing.   Cardiovascular: Negative for chest pain.  Gastrointestinal: Negative for abdominal distention, abdominal pain, blood in stool, constipation, diarrhea, nausea and vomiting.  Genitourinary: Negative for difficulty urinating, dysuria, enuresis, frequency, genital sores, pelvic pain, urgency, vaginal bleeding and vaginal discharge.   Depression screen [facility] 2/9 05/05/2016 04/30/2015  Decreased Interest 0 0  Down, Depressed, Hopeless 0 0  PHQ - 2 Score 0 0        Objective:   Physical Exam  Constitutional: She is oriented to person, place, and time. She appears well-developed. No distress.  HENT:  Right Ear: External ear normal.  Left Ear: External ear normal.  Mouth/Throat: Oropharynx is clear and moist.  Neck: Normal range of motion. Neck supple. No tracheal deviation present. No thyromegaly present.  Cardiovascular: Normal rate, regular rhythm and normal heart sounds. Exam reveals no gallop.  No murmur heard. Pulmonary/Chest: Effort normal and breath sounds normal. Right breast exhibits no inverted nipple, no mass, no skin change and no tenderness. Left breast exhibits no inverted nipple, no mass, no skin change and no tenderness. Breasts are symmetrical.  Axillae no adenopathy.  Abdominal: Soft. She exhibits no distension. There is no tenderness.  Genitourinary: Vagina normal and uterus normal. No vaginal discharge found.    Genitourinary Comments: External GU: No rashes or lesions.  A blind Pap smear was obtained due to intact hymen, unable to do a bimanual exam.  Patient understands uterus and adnexa cannot be evaluated.  Musculoskeletal: She exhibits no edema.  Lymphadenopathy:    She has no cervical adenopathy.  Neurological: She is alert and oriented to person, place, and time.  Skin: Skin is warm and dry. No rash noted.  Psychiatric: She has a normal mood and affect. Her behavior is normal.  Vitals reviewed.  Results for orders placed or performed in visit on 04/25/17  Lipid panel  Result Value Ref Range   Cholesterol, Total 165 100 - 199 mg/dL   Triglycerides 97 0 - 149 mg/dL   HDL 50 >39 mg/dL   VLDL Cholesterol Cal 19 5 - 40 mg/dL   LDL Calculated 96 0 - 99 mg/dL   Chol/HDL Ratio 3.3 0.0 - 4.4 ratio  Hepatic function panel  Result Value Ref Range   Total Protein 6.6 6.0 - 8.5 g/dL   Albumin 4.3 3.6 - 4.8 g/dL   Bilirubin Total 0.4 0.0 - 1.2 mg/dL   Bilirubin, Direct 0.13 0.00 - 0.40 mg/dL   Alkaline Phosphatase 80 39 - 117 IU/L   AST 21 0 - 40 IU/L   ALT 22 0 - 32 IU/L  Basic metabolic panel  Result Value Ref Range   Glucose 93 65 - 99 mg/dL   BUN 13 8 - 27 mg/dL   Creatinine, Ser 0.73 0.57 - 1.00 mg/dL   GFR calc non Af Amer 89 >59 mL/min/1.73   GFR calc Af Amer 102 >59 mL/min/1.73  BUN/Creatinine Ratio 18 12 - 28   Sodium 141 134 - 144 mmol/L   Potassium 4.2 3.5 - 5.2 mmol/L   Chloride 101 96 - 106 mmol/L   CO2 26 20 - 29 mmol/L   Calcium 10.0 8.7 - 10.3 mg/dL   Labs were reviewed with patient.        Assessment & Plan:   Problem List Items Addressed This Visit      Musculoskeletal and Integument   Osteoporosis   Relevant Orders   DG Bone Density    Other Visit Diagnoses    Well woman exam    -  Primary   Relevant Orders   Pap IG and HPV (high risk) DNA detection   DG Bone Density   Screening for cervical cancer       Relevant Orders   Pap IG and HPV (high  risk) DNA detection   Screening for HPV (human papillomavirus)       Relevant Orders   Pap IG and HPV (high risk) DNA detection   Post-menopausal       Relevant Orders   DG Bone Density     Medications were refilled yesterday.  Order DEXA scan.  Encourage continued activity as tolerated. Return in about 6 months (around 11/03/2017) for routine follow up.

## 2017-05-11 LAB — PAP IG AND HPV HIGH-RISK
HPV, HIGH-RISK: NEGATIVE
PAP Smear Comment: 0

## 2017-05-12 ENCOUNTER — Ambulatory Visit (HOSPITAL_COMMUNITY)
Admission: RE | Admit: 2017-05-12 | Discharge: 2017-05-12 | Disposition: A | Discharge status: Home / Self Care | Payer: Medicare HMO | Source: Ambulatory Visit | Attending: Nurse Practitioner | Admitting: Nurse Practitioner

## 2017-05-12 DIAGNOSIS — M8588 Other specified disorders of bone density and structure, other site: Secondary | ICD-10-CM | POA: Diagnosis not present

## 2017-05-12 DIAGNOSIS — Z01419 Encounter for gynecological examination (general) (routine) without abnormal findings: Secondary | ICD-10-CM | POA: Diagnosis not present

## 2017-05-12 DIAGNOSIS — M85851 Other specified disorders of bone density and structure, right thigh: Secondary | ICD-10-CM | POA: Diagnosis not present

## 2017-05-12 DIAGNOSIS — Z780 Asymptomatic menopausal state: Secondary | ICD-10-CM | POA: Insufficient documentation

## 2017-05-12 DIAGNOSIS — M810 Age-related osteoporosis without current pathological fracture: Secondary | ICD-10-CM | POA: Diagnosis not present

## 2017-07-01 ENCOUNTER — Telehealth: Payer: Self-pay | Admitting: Family Medicine

## 2017-07-01 NOTE — Telephone Encounter (Signed)
Pt dropped off a form to be filled out and signed. Form is in nurse box.

## 2017-07-01 NOTE — Telephone Encounter (Signed)
In your box on the wall.

## 2017-07-07 DIAGNOSIS — R69 Illness, unspecified: Secondary | ICD-10-CM | POA: Diagnosis not present

## 2017-07-20 ENCOUNTER — Other Ambulatory Visit: Payer: Self-pay | Admitting: Nurse Practitioner

## 2017-08-29 ENCOUNTER — Other Ambulatory Visit: Payer: Self-pay | Admitting: Family Medicine

## 2017-08-29 DIAGNOSIS — Z1231 Encounter for screening mammogram for malignant neoplasm of breast: Secondary | ICD-10-CM

## 2017-10-03 ENCOUNTER — Ambulatory Visit (HOSPITAL_COMMUNITY)
Admission: RE | Admit: 2017-10-03 | Discharge: 2017-10-03 | Disposition: A | Discharge status: Home / Self Care | Payer: Medicare HMO | Source: Ambulatory Visit | Attending: Family Medicine | Admitting: Family Medicine

## 2017-10-03 DIAGNOSIS — Z1231 Encounter for screening mammogram for malignant neoplasm of breast: Secondary | ICD-10-CM | POA: Diagnosis not present

## 2017-11-01 ENCOUNTER — Other Ambulatory Visit: Payer: Self-pay | Admitting: Nurse Practitioner

## 2017-11-02 ENCOUNTER — Ambulatory Visit (INDEPENDENT_AMBULATORY_CARE_PROVIDER_SITE_OTHER): Payer: Medicare HMO | Admitting: Nurse Practitioner

## 2017-11-02 ENCOUNTER — Encounter: Payer: Self-pay | Admitting: Nurse Practitioner

## 2017-11-02 VITALS — Ht 66.0 in | Wt 169.0 lb

## 2017-11-02 DIAGNOSIS — I10 Essential (primary) hypertension: Secondary | ICD-10-CM

## 2017-11-02 DIAGNOSIS — F418 Other specified anxiety disorders: Secondary | ICD-10-CM | POA: Diagnosis not present

## 2017-11-02 DIAGNOSIS — R69 Illness, unspecified: Secondary | ICD-10-CM | POA: Diagnosis not present

## 2017-11-03 ENCOUNTER — Encounter: Payer: Self-pay | Admitting: Nurse Practitioner

## 2017-11-03 NOTE — Progress Notes (Signed)
Subjective: Presents for recheck on her hypertension.  Adherent to medication regimen.  Tries to walk for activity.  Overall healthy diet. Denies CP/ischemic type pain or SOB. No visual changes. No difficulty speaking or swallowing. No numbness or weakness of the face, arms or legs.  Would like to go up on her dose of Celexa, currently on 30 mg.  Feels that is not working as well lately.  Denies any adverse effects. Depression screen [facility] 2/9 11/02/2017 05/05/2016 04/30/2015  Decreased Interest 0 0 0  Down, Depressed, Hopeless 0 0 0  PHQ - 2 Score 0 0 0      Objective:   Ht 5\' 6"  (1.676 m)   Wt 169 lb (76.7 kg)   BMI 27.28 kg/m  NAD.  Alert, oriented.  Lungs clear.  Heart regular rate and rhythm.  Carotids no bruits or thrills.  Lower extremities no edema.  Thoughts logical coherent and relevant.  Dressed appropriately.  Making good eye contact.  Assessment:   Problem List Items Addressed This Visit      Cardiovascular and Mediastinum   Hypertension - Primary     Other   Depression with anxiety       Plan: Increase Celexa to 40 mg (2 of her 20 mg that she has now).  If no adverse effects and this is working, call for refill on new dose.  If any problems go back to the 30 mg and contact office.  Encourage continued activity and healthy diet. Return in about 6 months (around 05/05/2018) for physical.

## 2017-11-09 IMAGING — MG MM DIGITAL SCREENING
6 of 9 series · 6 of 25 positions shown · non-contrast
Comparison: Previous exam(s).

CLINICAL DATA: Screening.

EXAM:
2D DIGITAL SCREENING BILATERAL MAMMOGRAM WITH CAD AND ADJUNCT TOMO

[R MLO (1 of 2)]
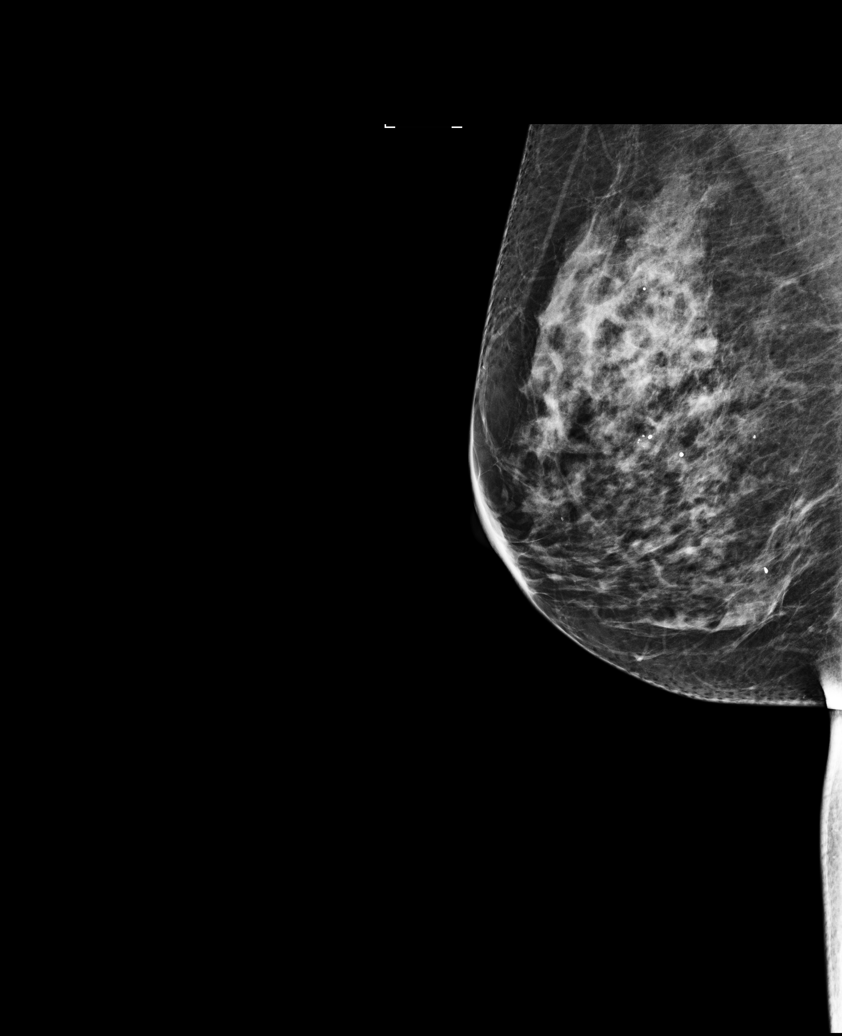

[R CC]
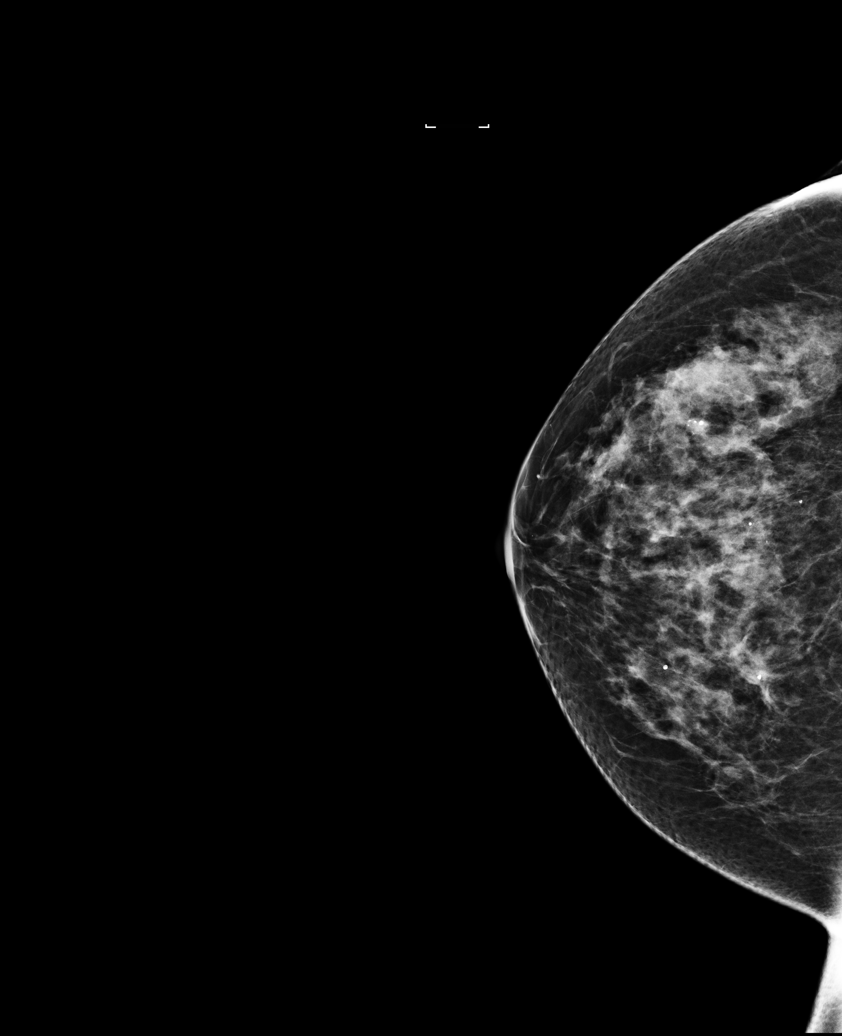

[R MLO (2 of 2)]
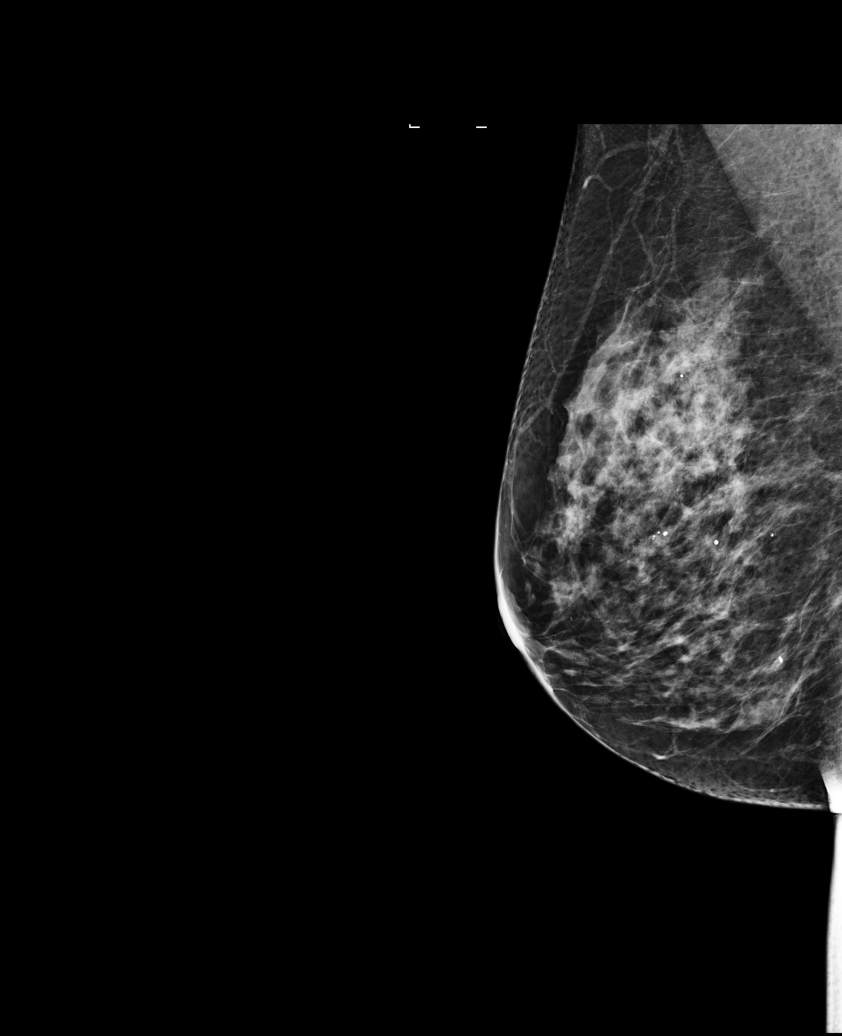

[L CC]
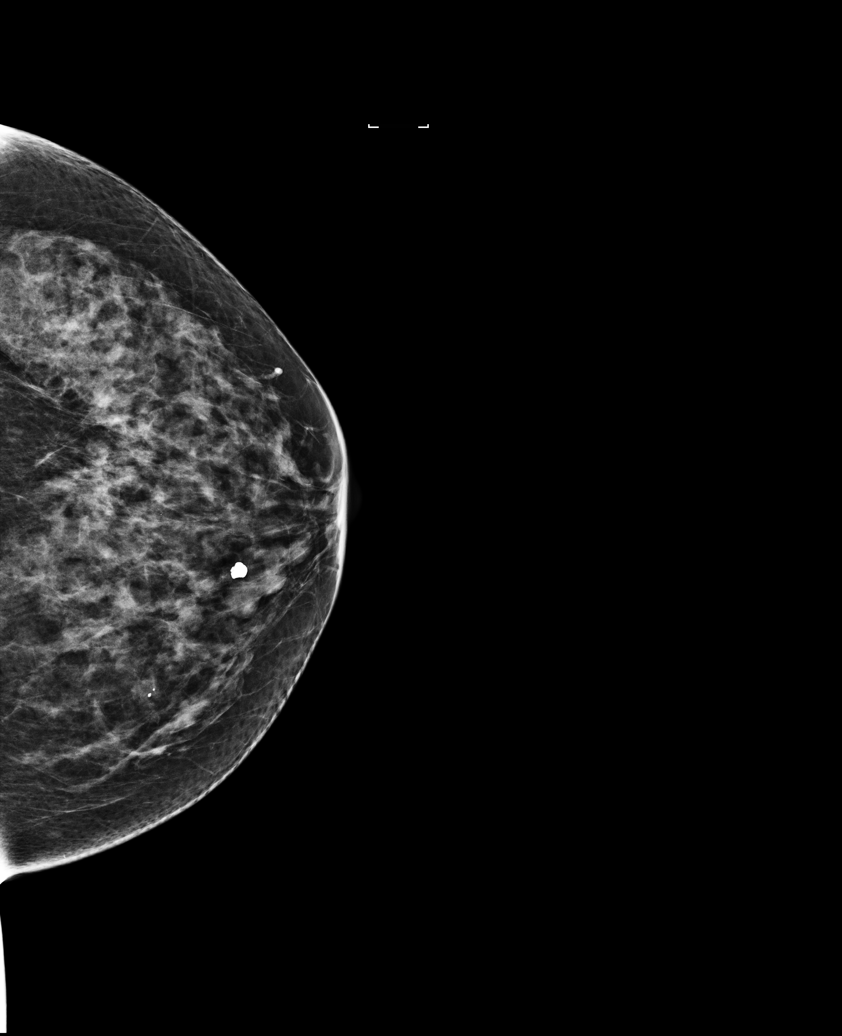

[L MLO]
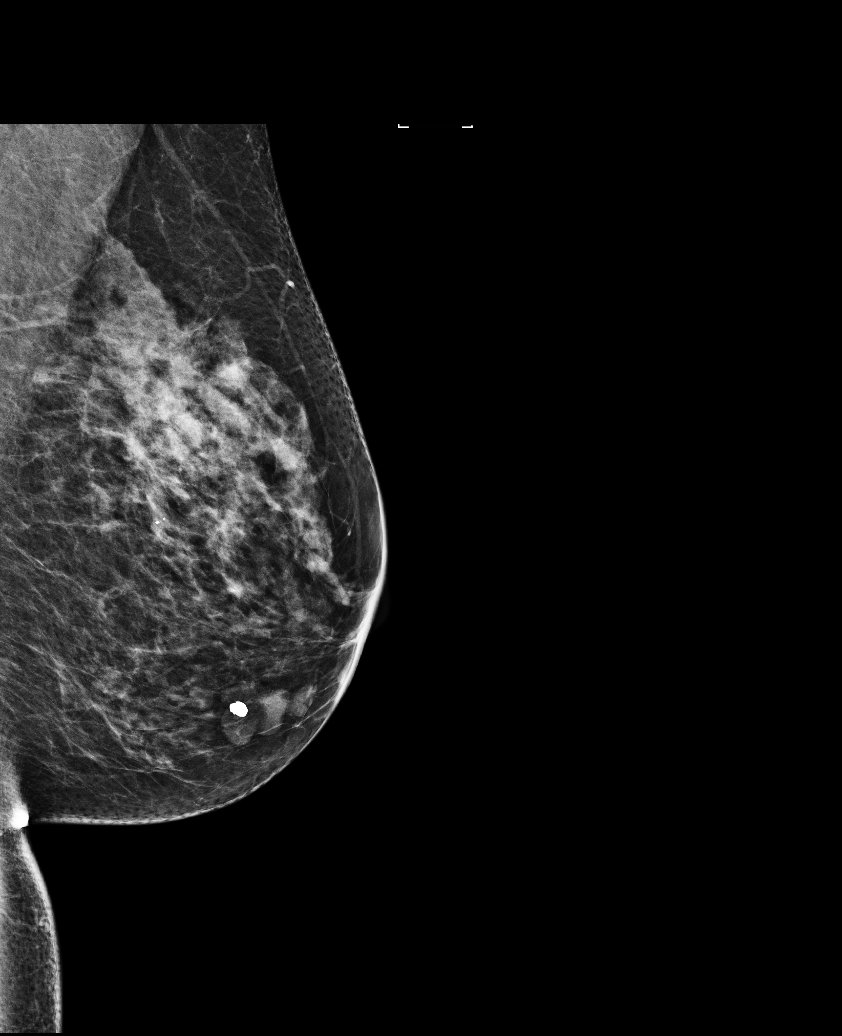

[L MLO tomo · tomo slice 31/62.0]
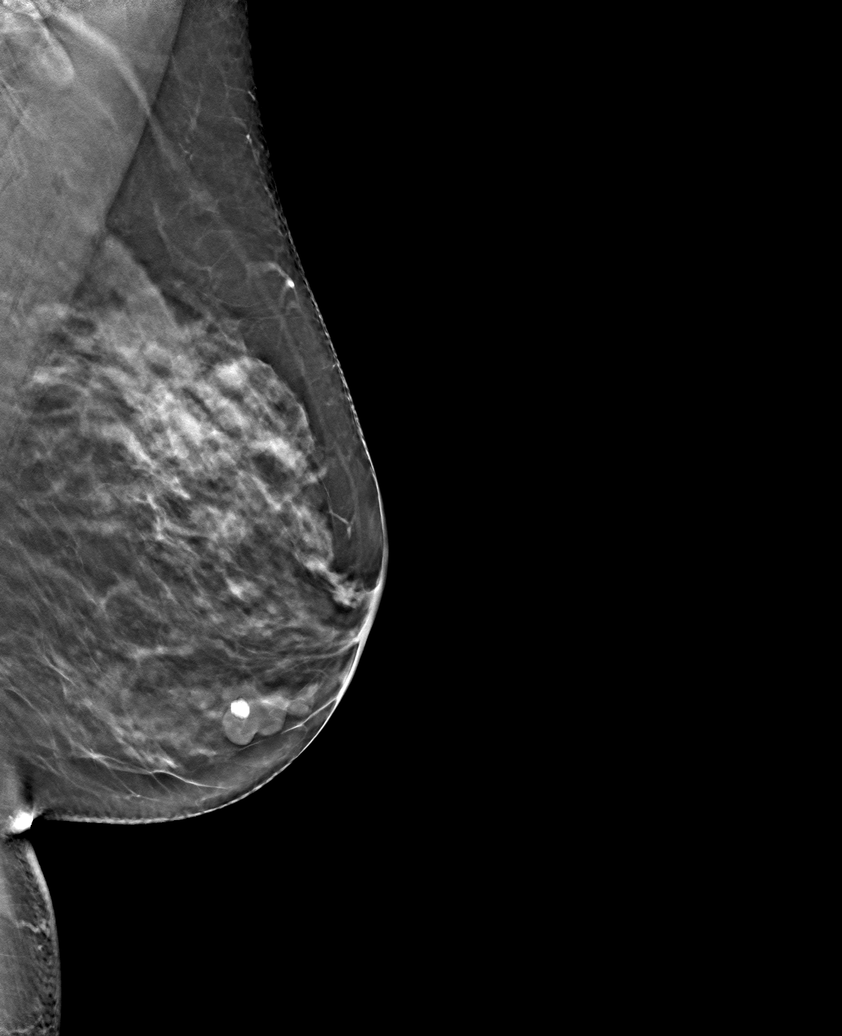

[6 of 25 positions shown; findings below may reference images not displayed]

ACR Breast Density Category c: The breast tissue is heterogeneously
dense, which may obscure small masses.
FINDINGS: There are no findings suspicious for malignancy. Images were
processed with CAD.
IMPRESSION: No mammographic evidence of malignancy. A result letter of this
screening mammogram will be mailed directly to the patient.

RECOMMENDATION:
Screening mammogram in one year. (Code:[id number]-[id number])

BI-RADS CATEGORY  1: Negative.

## 2017-12-28 ENCOUNTER — Other Ambulatory Visit: Payer: Self-pay | Admitting: Nurse Practitioner

## 2018-01-12 DIAGNOSIS — R69 Illness, unspecified: Secondary | ICD-10-CM | POA: Diagnosis not present

## 2018-01-31 ENCOUNTER — Emergency Department (HOSPITAL_COMMUNITY)
Admission: EM | Admit: 2018-01-31 | Discharge: 2018-01-31 | Disposition: A | Discharge status: Home / Self Care | Payer: Medicare HMO | Attending: Emergency Medicine | Admitting: Emergency Medicine

## 2018-01-31 ENCOUNTER — Encounter (HOSPITAL_COMMUNITY): Payer: Self-pay | Admitting: Emergency Medicine

## 2018-01-31 ENCOUNTER — Other Ambulatory Visit: Payer: Self-pay

## 2018-01-31 DIAGNOSIS — X509XXA Other and unspecified overexertion or strenuous movements or postures, initial encounter: Secondary | ICD-10-CM | POA: Insufficient documentation

## 2018-01-31 DIAGNOSIS — M79605 Pain in left leg: Secondary | ICD-10-CM

## 2018-01-31 DIAGNOSIS — Y9389 Activity, other specified: Secondary | ICD-10-CM | POA: Insufficient documentation

## 2018-01-31 DIAGNOSIS — I10 Essential (primary) hypertension: Secondary | ICD-10-CM | POA: Diagnosis not present

## 2018-01-31 DIAGNOSIS — S86912A Strain of unspecified muscle(s) and tendon(s) at lower leg level, left leg, initial encounter: Secondary | ICD-10-CM | POA: Diagnosis not present

## 2018-01-31 DIAGNOSIS — F329 Major depressive disorder, single episode, unspecified: Secondary | ICD-10-CM | POA: Insufficient documentation

## 2018-01-31 DIAGNOSIS — S8992XA Unspecified injury of left lower leg, initial encounter: Secondary | ICD-10-CM | POA: Diagnosis present

## 2018-01-31 DIAGNOSIS — Z79899 Other long term (current) drug therapy: Secondary | ICD-10-CM | POA: Diagnosis not present

## 2018-01-31 DIAGNOSIS — M79652 Pain in left thigh: Secondary | ICD-10-CM | POA: Diagnosis not present

## 2018-01-31 DIAGNOSIS — Z96642 Presence of left artificial hip joint: Secondary | ICD-10-CM | POA: Insufficient documentation

## 2018-01-31 DIAGNOSIS — R69 Illness, unspecified: Secondary | ICD-10-CM | POA: Diagnosis not present

## 2018-01-31 DIAGNOSIS — S76912A Strain of unspecified muscles, fascia and tendons at thigh level, left thigh, initial encounter: Secondary | ICD-10-CM | POA: Diagnosis not present

## 2018-01-31 DIAGNOSIS — Y998 Other external cause status: Secondary | ICD-10-CM | POA: Insufficient documentation

## 2018-01-31 DIAGNOSIS — Z96651 Presence of right artificial knee joint: Secondary | ICD-10-CM | POA: Insufficient documentation

## 2018-01-31 DIAGNOSIS — T148XXA Other injury of unspecified body region, initial encounter: Secondary | ICD-10-CM

## 2018-01-31 DIAGNOSIS — F419 Anxiety disorder, unspecified: Secondary | ICD-10-CM | POA: Insufficient documentation

## 2018-01-31 DIAGNOSIS — Y929 Unspecified place or not applicable: Secondary | ICD-10-CM | POA: Diagnosis not present

## 2018-01-31 MED ORDER — ONDANSETRON HCL 4 MG PO TABS
4.0000 mg | ORAL_TABLET | Freq: Once | ORAL | Status: AC
  Administered 2018-01-31: 4 mg via ORAL
  Filled 2018-01-31: qty 1

## 2018-01-31 MED ORDER — METHOCARBAMOL 500 MG PO TABS: 500.0000 mg | ORAL_TABLET | Freq: Three times a day (TID) | ORAL | 0 refills | Status: DC

## 2018-01-31 MED ORDER — MELOXICAM 7.5 MG PO TABS: 7.5000 mg | ORAL_TABLET | Freq: Every day | ORAL | 0 refills | Status: DC

## 2018-01-31 MED ORDER — TRAMADOL HCL 50 MG PO TABS: ORAL_TABLET | ORAL | 0 refills | Status: DC

## 2018-01-31 MED ORDER — IBUPROFEN 800 MG PO TABS
800.0000 mg | ORAL_TABLET | Freq: Once | ORAL | Status: AC
  Administered 2018-01-31: 800 mg via ORAL
  Filled 2018-01-31: qty 1

## 2018-01-31 MED ORDER — CYCLOBENZAPRINE HCL 10 MG PO TABS
10.0000 mg | ORAL_TABLET | Freq: Once | ORAL | Status: AC
Start: 2018-01-31 — End: 2018-01-31
  Administered 2018-01-31: 10 mg via ORAL
  Filled 2018-01-31: qty 1

## 2018-01-31 NOTE — Discharge Instructions (Addendum)
Your vital signs within normal limits.  Your examination favors a muscle strain involving the left lower extremity.  Please use warm Epson salt soaks for about 38minutes daily.  Please use meloxicam daily with food.  Please use Robaxin 3 times daily for spasm pain.  Use Tylenol extra strength every 4 hours for mild to moderate pain.  Use Ultram for more severe pain.  Ultram and Robaxin may cause drowsiness.  Please do not drive a vehicle, operate machinery, or participate in activities requiring concentration when taking this medication.  Please see Dr. Wolfgang Phoenix, or return to the emergency department if not improving.  Please use your walker when getting around until this problem has resolved.

## 2018-01-31 NOTE — ED Triage Notes (Signed)
Pt states she thinks she has pulled something in her left leg x 2 days ago. Unable to bare weight today. No obvious deformity or bruising noted. From mid thigh down leg. Nad. Has beent taking motrin with no relief.

## 2018-01-31 NOTE — ED Provider Notes (Signed)
[facility] EMERGENCY DEPARTMENT Provider Note   CSN: [id number] Arrival date & time: 01/31/18  1738     History   Chief Complaint Chief Complaint  Patient presents with  . Leg Pain    HPI Michelle Landry is a 62 y.o. female.  Patient is a 62 year old female who presents to the emergency department with a complaint of left leg pain.  The patient states that approximately a week ago she was doing exercises with a family member.  After the exercise she had pain in her left leg.  She noticed that she was limping around.  Today she has pain from the mid thigh down to the lower leg.  She says she can not stand to bear weight on it.  No falls reported.  No other injuries reported at this time.  Patient has not had any unusual swelling of the lower extremity.  She presents now for assistance with this pain.  The history is provided by the patient.  Leg Pain      Past Medical History:  Diagnosis Date  . Allergic rhinitis   . History of bronchitis   . Hypertension   . Osteoporosis 2012    Patient Active Problem List   Diagnosis Date Noted  . 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 Special screening for malignant neoplasms, colon   . Osteoporosis 12/03/2012  . Hypertension 12/03/2012  . Depression with anxiety 12/03/2012  . Quadriceps weakness 11/03/2011  . Difficulty in walking(719.7) 11/03/2011  . Muscle weakness (generalized) 01/26/2011  . Stiffness of joint, not elsewhere classified, lower leg 01/26/2011  . Knee pain, right 01/26/2011  . KNEE PAIN 06/26/2008    Past Surgical History:  Procedure Laterality Date  . COLONOSCOPY  2006 MJ   ?POLYPS  . COLONOSCOPY N/A 06/04/2015   Procedure: COLONOSCOPY;  Surgeon: Danie Binder, MD;  Location: [facility] SUITE;  Service: Endoscopy;  Laterality: N/A;  9:15  . 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 Right Knee Replacement  12/2010     OB History   None      Home Medications    Prior to Admission medications   Medication Sig Start  Date End Date Taking? Authorizing Provider  albuterol (PROVENTIL HFA;VENTOLIN HFA) 108 (90 BASE) MCG/ACT inhaler Inhale 2 puffs into the lungs every 4 (four) hours as needed. Patient not taking: Reported on 11/02/2017 07/08/14   Nilda Simmer, NP  alendronate (FOSAMAX) 70 MG tablet TAKE ONE TABLET BY MOUTH  ONCE A WEEK. TAKE WITH A FULL GLASS OF WATER ON AN EMPTY STOMACH. 05/05/17   Nilda Simmer, NP  citalopram (CELEXA) 20 MG tablet TAKE 1 & 1/2 (ONE & ONE-HALF) TABLETS BY MOUTH ONCE DAILY AT BEDTIME 12/29/17   Nilda Simmer, NP  etodolac (LODINE) 400 MG tablet Take 1 tablet (400 mg total) by mouth 2 (two) times daily. Take with food Patient not taking: Reported on 11/02/2017 03/28/17   Mikey Kirschner, MD  indapamide (LOZOL) 1.25 MG tablet TAKE 1 TABLET BY MOUTH EACH MORNING FOR BLOOD PRESSURE AND  FLUID 11/02/17   Nilda Simmer, NP  lisinopril (PRINIVIL,ZESTRIL) 20 MG tablet TAKE 1 TABLET BY MOUTH ONCE DAILY IN THE MORNING 11/02/17   Nilda Simmer, NP  potassium chloride (K-DUR) 10 MEQ tablet TAKE 1 TABLET BY MOUTH ONCE DAILY 11/02/17   Nilda Simmer, NP    Family History Family History  Problem Relation Age of Onset  . Hypertension Sister   . Diabetes Sister   .  Heart disease Sister   . Hypertension Mother   . Osteoporosis Mother   . Heart disease Father 70       MI  . Cancer Sister        breast    Social History Social History   Tobacco Use  . Smoking status: Never Smoker  . Smokeless tobacco: Never Used  Substance Use Topics  . Alcohol use: No    Alcohol/week: 0.0 standard drinks  . Drug use: No     Allergies   Sulfa antibiotics   Review of Systems Review of Systems  Constitutional: Negative for activity change.       All ROS Neg except as noted in HPI  HENT: Negative for nosebleeds.   Eyes: Negative for photophobia and discharge.  Respiratory: Negative for cough, shortness of breath and wheezing.   Cardiovascular: Negative for chest  pain and palpitations.  Gastrointestinal: Negative for abdominal pain and blood in stool.  Genitourinary: Negative for dysuria, frequency and hematuria.  Musculoskeletal: Positive for arthralgias. Negative for back pain and neck pain.  Skin: Negative.   Neurological: Negative for dizziness, seizures and speech difficulty.  Psychiatric/Behavioral: Negative for confusion and hallucinations.     Physical Exam Updated Vital Signs BP 140/66   Pulse (!) 59   Temp 98.4 F (36.9 C) (Oral)   Resp 17   SpO2 99%   Physical Exam  Constitutional: She is oriented to person, place, and time. She appears well-developed and well-nourished.  Non-toxic appearance.  HENT:  Head: Normocephalic.  Right Ear: Tympanic membrane and external ear normal.  Left Ear: Tympanic membrane and external ear normal.  Eyes: Pupils are equal, round, and reactive to light. EOM and lids are normal.  Neck: Normal range of motion. Neck supple. Carotid bruit is not present.  Cardiovascular: Normal rate, regular rhythm, normal heart sounds, intact distal pulses and normal pulses.  Pulmonary/Chest: Breath sounds normal. No respiratory distress.  Abdominal: Soft. Bowel sounds are normal. There is no tenderness. There is no guarding.  Musculoskeletal: Normal range of motion.       Left upper leg: She exhibits tenderness. She exhibits no swelling, no edema and no deformity.  There are no swollen lymph nodes or palpable mass in the inguinal area on the left.  There is no change in temperature or size with the left as compared to the right lower extremity.  There is tenderness on the inner aspect of the left lower thigh and calf area.  There is tenderness with attempted range of motion on the lateral portion of the left thigh, knee, and calf.  The dorsalis pedis pulses 2+.  The capillary refill is less than 2 seconds.  There is no shortening noted of the lower extremities.  The Achilles tendon is intact.  There is a negative  Thompson's sign.  Lymphadenopathy:       Head (right side): No submandibular adenopathy present.       Head (left side): No submandibular adenopathy present.    She has no cervical adenopathy.  Neurological: She is alert and oriented to person, place, and time. She has normal strength. No cranial nerve deficit or sensory deficit.  Skin: Skin is warm and dry.  Psychiatric: She has a normal mood and affect. Her speech is normal.  Nursing note and vitals reviewed.    ED Treatments / Results  Labs (all labs ordered are listed, but only abnormal results are displayed) Labs Reviewed - No data to display  EKG None  Radiology No results found.  Procedures Procedures (including critical care time)  Medications Ordered in ED Medications - No data to display   Initial Impression / Assessment and Plan / ED Course  I have reviewed the triage vital signs and the nursing notes.  Pertinent labs & imaging results that were available during my care of the patient were reviewed by me and considered in my medical decision making (see chart for details).       Final Clinical Impressions(s) / ED Diagnoses MDM  Vital signs reviewed.  Pulse oximetry is 99% on room air.  Within normal limits by my interpretation.  Patient was exercising with a family member on last week, and continues to have pain of the left lower extremity.  The examination is negative for any signs of infection.  There is been no reported fall or trauma to the area.  No evidence for DVT, or vascular compromise.  I suspect that the patient has a muscle strain involving the left lower extremity.  The patient states she has a walker to use at home.  Crutches were offered but declined at this time.  The patient will be given a prescription for Ultram, Flexeril, and meloxicam.  The patient is to follow-up with Dr. Wolfgang Phoenix for additional evaluation if not improving.   Final diagnoses:  Muscle strain  Left leg pain    ED  Discharge Orders         Ordered    traMADol (ULTRAM) 50 MG tablet     01/31/18 1955    methocarbamol (ROBAXIN) 500 MG tablet  3 times daily     01/31/18 1955    meloxicam (MOBIC) 7.5 MG tablet  Daily     01/31/18 1955           Lily Kocher, PA-C 01/31/18 1957    Virgel Manifold, MD 02/02/18 1324

## 2018-02-13 ENCOUNTER — Telehealth: Payer: Self-pay | Admitting: Family Medicine

## 2018-02-13 DIAGNOSIS — Z79899 Other long term (current) drug therapy: Secondary | ICD-10-CM

## 2018-02-13 DIAGNOSIS — Z13220 Encounter for screening for lipoid disorders: Secondary | ICD-10-CM

## 2018-02-13 DIAGNOSIS — I10 Essential (primary) hypertension: Secondary | ICD-10-CM

## 2018-02-13 NOTE — Telephone Encounter (Signed)
Last had 04/24/2017 bmet,hepatic fx,lipid.

## 2018-02-13 NOTE — Telephone Encounter (Signed)
Patient is scheduled for yearly physical on 05/08/18 with Ria Comment, pt Is requesting order for blood work to be sent ahead of time before appt date, please advise and notify pt.

## 2018-02-14 NOTE — Telephone Encounter (Signed)
Blood work ordered in Epic. Patient notified. 

## 2018-02-14 NOTE — Telephone Encounter (Signed)
Rep same plus cbc 

## 2018-03-10 ENCOUNTER — Ambulatory Visit (HOSPITAL_COMMUNITY)
Admission: RE | Admit: 2018-03-10 | Discharge: 2018-03-10 | Disposition: A | Discharge status: Home / Self Care | Payer: Medicare HMO | Source: Ambulatory Visit | Attending: Family Medicine | Admitting: Family Medicine

## 2018-03-10 ENCOUNTER — Encounter: Payer: Self-pay | Admitting: Family Medicine

## 2018-03-10 ENCOUNTER — Ambulatory Visit (INDEPENDENT_AMBULATORY_CARE_PROVIDER_SITE_OTHER): Payer: Medicare HMO | Admitting: Family Medicine

## 2018-03-10 VITALS — BP 140/92 | Temp 98.1°F | Ht 66.0 in | Wt 169.4 lb

## 2018-03-10 DIAGNOSIS — M25562 Pain in left knee: Secondary | ICD-10-CM | POA: Insufficient documentation

## 2018-03-10 DIAGNOSIS — M1712 Unilateral primary osteoarthritis, left knee: Secondary | ICD-10-CM | POA: Diagnosis not present

## 2018-03-10 DIAGNOSIS — S8992XA Unspecified injury of left lower leg, initial encounter: Secondary | ICD-10-CM | POA: Diagnosis not present

## 2018-03-10 MED ORDER — TRAMADOL HCL 50 MG PO TABS: ORAL_TABLET | ORAL | 0 refills | Status: DC

## 2018-03-10 MED ORDER — MELOXICAM 15 MG PO TABS: 15.0000 mg | ORAL_TABLET | Freq: Every day | ORAL | 0 refills | Status: DC

## 2018-03-10 NOTE — Progress Notes (Signed)
   Subjective:    Patient ID: Michelle Landry, female    DOB: [birth year], 62 y.o.   MRN: [id number]  Knee Pain   The incident occurred more than 1 week ago. The incident occurred at home. Injury mechanism: fell on bed springs. The pain is present in the left leg and left knee. Associated symptoms include tingling. Exacerbated by: muscle relaxers, muscle rub. She has tried acetaminophen, heat and ice for the symptoms. The treatment provided mild relief.   Pt went to ER on 02/01/18 for knee pain. Patient relates the knee cannot bend as well as it should swells it causes pain discomfort inhibits her from doing activity she is tried anti-inflammatories without much success she denies high fever chills sweats  Review of Systems  Neurological: Positive for tingling.  She relates some tingling around the knee pain discomfort swelling she relates a decreased range of motion denies calf pain thigh pain does have intermittent hip pain is had artificial knee replacement on the right side and hip replacement on the left side     Objective:   Physical Exam  Does not appear toxic calf is normal size normal knee shows evidence of chronic inflammation no swelling currently.  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

## 2018-03-20 ENCOUNTER — Encounter: Payer: Self-pay | Admitting: Family Medicine

## 2018-03-28 DIAGNOSIS — R69 Illness, unspecified: Secondary | ICD-10-CM | POA: Diagnosis not present

## 2018-03-31 ENCOUNTER — Other Ambulatory Visit: Payer: Self-pay

## 2018-03-31 ENCOUNTER — Telehealth: Payer: Self-pay | Admitting: Family Medicine

## 2018-03-31 MED ORDER — ALENDRONATE SODIUM 70 MG PO TABS: ORAL_TABLET | ORAL | 11 refills | Status: DC

## 2018-03-31 NOTE — Telephone Encounter (Signed)
Pharmacy requesting refill on fosamax 70 mg tablet. Take one tablet by mouth once a week with full glass of water on empty stomach.

## 2018-03-31 NOTE — Telephone Encounter (Signed)
Done

## 2018-03-31 NOTE — Telephone Encounter (Signed)
One yrs worth 

## 2018-04-17 DIAGNOSIS — M1712 Unilateral primary osteoarthritis, left knee: Secondary | ICD-10-CM | POA: Insufficient documentation

## 2018-04-17 DIAGNOSIS — M25562 Pain in left knee: Secondary | ICD-10-CM | POA: Diagnosis not present

## 2018-05-01 ENCOUNTER — Other Ambulatory Visit: Payer: Self-pay

## 2018-05-01 DIAGNOSIS — Z13220 Encounter for screening for lipoid disorders: Secondary | ICD-10-CM | POA: Diagnosis not present

## 2018-05-01 DIAGNOSIS — Z79899 Other long term (current) drug therapy: Secondary | ICD-10-CM | POA: Diagnosis not present

## 2018-05-01 DIAGNOSIS — I10 Essential (primary) hypertension: Secondary | ICD-10-CM | POA: Diagnosis not present

## 2018-05-01 MED ORDER — INDAPAMIDE 1.25 MG PO TABS: ORAL_TABLET | ORAL | 1 refills | Status: DC

## 2018-05-01 MED ORDER — LISINOPRIL 20 MG PO TABS: ORAL_TABLET | ORAL | 1 refills | Status: DC

## 2018-05-01 MED ORDER — POTASSIUM CHLORIDE ER 10 MEQ PO TBCR: 10.0000 meq | EXTENDED_RELEASE_TABLET | Freq: Every day | ORAL | 1 refills | Status: DC

## 2018-05-02 LAB — BASIC METABOLIC PANEL
BUN / CREAT RATIO: 18 (ref 12–28)
BUN: 12 mg/dL (ref 8–27)
CALCIUM: 9.2 mg/dL (ref 8.7–10.3)
CHLORIDE: 103 mmol/L (ref 96–106)
CO2: 26 mmol/L (ref 20–29)
Creatinine, Ser: 0.67 mg/dL (ref 0.57–1.00)
GFR calc non Af Amer: 94 mL/min/{1.73_m2} (ref >59)
GFR, EST AFRICAN AMERICAN: 108 mL/min/{1.73_m2} (ref >59)
GLUCOSE: 91 mg/dL (ref 65–99)
POTASSIUM: 3.9 mmol/L (ref 3.5–5.2)
Sodium: 144 mmol/L (ref 134–144)

## 2018-05-02 LAB — CBC WITH DIFFERENTIAL/PLATELET
BASOS ABS: 0 10*3/uL (ref 0.0–0.2)
Basos: 1 %
EOS (ABSOLUTE): 0.1 10*3/uL (ref 0.0–0.4)
Eos: 3 %
Hematocrit: 40.2 % (ref 34.0–46.6)
Hemoglobin: 13.1 g/dL (ref 11.1–15.9)
Immature Grans (Abs): 0 10*3/uL (ref 0.0–0.1)
Immature Granulocytes: 0 %
LYMPHS ABS: 1.2 10*3/uL (ref 0.7–3.1)
LYMPHS: 25 %
MCH: 28.9 pg (ref 26.6–33.0)
MCHC: 32.6 g/dL (ref 31.5–35.7)
MCV: 89 fL (ref 79–97)
Monocytes Absolute: 0.4 10*3/uL (ref 0.1–0.9)
Monocytes: 8 %
NEUTROS ABS: 3.1 10*3/uL (ref 1.4–7.0)
Neutrophils: 63 %
PLATELETS: 259 10*3/uL (ref 150–450)
RBC: 4.53 x10E6/uL (ref 3.77–5.28)
RDW: 13 % (ref 12.3–15.4)
WBC: 4.9 10*3/uL (ref 3.4–10.8)

## 2018-05-02 LAB — LIPID PANEL
CHOLESTEROL TOTAL: 178 mg/dL (ref 100–199)
Chol/HDL Ratio: 3.4 ratio (ref 0.0–4.4)
HDL: 52 mg/dL (ref >39)
LDL Calculated: 102 mg/dL — ABNORMAL HIGH (ref 0–99)
Triglycerides: 121 mg/dL (ref 0–149)
VLDL Cholesterol Cal: 24 mg/dL (ref 5–40)

## 2018-05-02 LAB — HEPATIC FUNCTION PANEL
ALBUMIN: 4.4 g/dL (ref 3.6–4.8)
ALK PHOS: 74 IU/L (ref 39–117)
ALT: 19 IU/L (ref 0–32)
AST: 14 IU/L (ref 0–40)
BILIRUBIN, DIRECT: 0.1 mg/dL (ref 0.00–0.40)
Bilirubin Total: 0.4 mg/dL (ref 0.0–1.2)
TOTAL PROTEIN: 6.3 g/dL (ref 6.0–8.5)

## 2018-05-08 ENCOUNTER — Encounter: Payer: Self-pay | Admitting: Family Medicine

## 2018-05-08 ENCOUNTER — Ambulatory Visit (INDEPENDENT_AMBULATORY_CARE_PROVIDER_SITE_OTHER): Payer: Medicare HMO | Admitting: Family Medicine

## 2018-05-08 VITALS — BP 122/88 | Ht 65.5 in | Wt 171.6 lb

## 2018-05-08 DIAGNOSIS — I10 Essential (primary) hypertension: Secondary | ICD-10-CM | POA: Diagnosis not present

## 2018-05-08 DIAGNOSIS — F418 Other specified anxiety disorders: Secondary | ICD-10-CM | POA: Diagnosis not present

## 2018-05-08 DIAGNOSIS — Z0000 Encounter for general adult medical examination without abnormal findings: Secondary | ICD-10-CM

## 2018-05-08 DIAGNOSIS — R69 Illness, unspecified: Secondary | ICD-10-CM | POA: Diagnosis not present

## 2018-05-08 MED ORDER — CITALOPRAM HYDROBROMIDE 20 MG PO TABS: ORAL_TABLET | ORAL | 1 refills | Status: DC

## 2018-05-08 NOTE — Progress Notes (Signed)
Subjective:    Patient ID: Michelle Landry, female    DOB: Oct 21, 1955, 62 y.o.   MRN: [id number]  HPI AWV- Annual Wellness Visit  The patient was seen for their annual wellness visit. The patient's past medical history, surgical history, and family history were reviewed. Pertinent vaccines were reviewed ( tetanus, pneumonia, shingles, flu) The patient's medication list was reviewed and updated.  The height and weight were entered.  BMI recorded in electronic record elsewhere  Cognitive screening was completed. Outcome of Mini - Cog: pass   Falls /depression screening electronically recorded within record elsewhere  Current tobacco usage: none (All patients who use tobacco were given written and verbal information on quitting)  Recent listing of emergency department/hospitalizations over the past year were reviewed.  current specialist the patient sees on a regular basis: Dr.Olen about knee as needed; gets regular dental exams every 6 months. Dr. Oneida Alar for colonoscopy.   Medicare annual wellness visit patient questionnaire was reviewed.  A written screening schedule for the patient for the next 5-10 years was given. Appropriate discussion of followup regarding next visit was discussed.  HTN: Compliance with medications, taking lozol and lisinopril. Denies adverse effects.   -was getting regular exercise up until knee pain, seeing ortho   -diet has gone downhill per patient, but she is working on it  Moods: Well controlled with celexa per pt. Reports compliance with medication, denies adverse effects. Pt would like to continue. Denies SI/HI.   Denies sexual activity, has gone through menopause, denies vaginal bleeding or other problems. UTD on pap smear.   Review of Systems  Constitutional: Negative for chills, fatigue, fever and unexpected weight change.  HENT: Negative for congestion, ear pain, sinus pressure, sinus pain and sore throat.   Eyes: Negative for discharge and  visual disturbance.  Respiratory: Negative for cough, shortness of breath and wheezing.   Cardiovascular: Negative for chest pain and leg swelling.  Gastrointestinal: Negative for abdominal pain, blood in stool, constipation, diarrhea, nausea and vomiting.  Genitourinary: Negative for difficulty urinating, hematuria, pelvic pain, vaginal bleeding and vaginal discharge.  Neurological: Negative for syncope, weakness, light-headedness and headaches.  Psychiatric/Behavioral: Negative for suicidal ideas.  All other systems reviewed and are negative.      Objective:   Physical Exam  Constitutional: She is oriented to person, place, and time. She appears well-developed and well-nourished. No distress.  HENT:  Head: Normocephalic and atraumatic.  Right Ear: Tympanic membrane normal.  Left Ear: Tympanic membrane normal.  Nose: Nose normal.  Mouth/Throat: Uvula is midline and oropharynx is clear and moist.  Eyes: Pupils are equal, round, and reactive to light. Conjunctivae and EOM are normal. Right eye exhibits no discharge. Left eye exhibits no discharge.  Neck: Neck supple. No thyromegaly present.  Cardiovascular: Normal rate, regular rhythm and normal heart sounds.  No murmur heard. Pulmonary/Chest: Effort normal and breath sounds normal. No respiratory distress. She has no wheezes. Right breast exhibits no inverted nipple, no mass, no nipple discharge, no skin change and no tenderness. Left breast exhibits no inverted nipple, no mass, no nipple discharge, no skin change and no tenderness.  Abdominal: Soft. Bowel sounds are normal. She exhibits no distension and no mass. There is no tenderness.  Genitourinary:  Genitourinary Comments: GU exam deferred.  Musculoskeletal: She exhibits no edema or deformity.  Lymphadenopathy:    She has no cervical adenopathy.  Neurological: She is alert and oriented to person, place, and time. Coordination normal.  Skin: Skin is warm and  dry.  Psychiatric:  She has a normal mood and affect.  Nursing note and vitals reviewed.  Depression screen [facility] 2/9 05/08/2018 11/02/2017 05/05/2016 04/30/2015  Decreased Interest 0 0 0 0  Down, Depressed, Hopeless 0 0 0 0  PHQ - 2 Score 0 0 0 0  Altered sleeping 0 - - -  Tired, decreased energy 0 - - -  Change in appetite 0 - - -  Feeling bad or failure about yourself  0 - - -  Trouble concentrating 0 - - -  Moving slowly or fidgety/restless 0 - - -  Suicidal thoughts 0 - - -  PHQ-9 Score 0 - - -  Difficult doing work/chores Not difficult at all - - -   Results for orders placed or performed in visit on 02/13/18  Lipid panel  Result Value Ref Range   Cholesterol, Total 178 100 - 199 mg/dL   Triglycerides 121 0 - 149 mg/dL   HDL 52 >39 mg/dL   VLDL Cholesterol Cal 24 5 - 40 mg/dL   LDL Calculated 102 (H) 0 - 99 mg/dL   Chol/HDL Ratio 3.4 0.0 - 4.4 ratio  Hepatic function panel  Result Value Ref Range   Total Protein 6.3 6.0 - 8.5 g/dL   Albumin 4.4 3.6 - 4.8 g/dL   Bilirubin Total 0.4 0.0 - 1.2 mg/dL   Bilirubin, Direct 0.10 0.00 - 0.40 mg/dL   Alkaline Phosphatase 74 39 - 117 IU/L   AST 14 0 - 40 IU/L   ALT 19 0 - 32 IU/L  Basic metabolic panel  Result Value Ref Range   Glucose 91 65 - 99 mg/dL   BUN 12 8 - 27 mg/dL   Creatinine, Ser 0.67 0.57 - 1.00 mg/dL   GFR calc non Af Amer 94 >59 mL/min/1.73   GFR calc Af Amer 108 >59 mL/min/1.73   BUN/Creatinine Ratio 18 12 - 28   Sodium 144 134 - 144 mmol/L   Potassium 3.9 3.5 - 5.2 mmol/L   Chloride 103 96 - 106 mmol/L   CO2 26 20 - 29 mmol/L   Calcium 9.2 8.7 - 10.3 mg/dL  CBC with Differential/Platelet  Result Value Ref Range   WBC 4.9 3.4 - 10.8 x10E3/uL   RBC 4.53 3.77 - 5.28 x10E6/uL   Hemoglobin 13.1 11.1 - 15.9 g/dL   Hematocrit 40.2 34.0 - 46.6 %   MCV 89 79 - 97 fL   MCH 28.9 26.6 - 33.0 pg   MCHC 32.6 31.5 - 35.7 g/dL   RDW 13.0 12.3 - 15.4 %   Platelets 259 150 - 450 x10E3/uL   Neutrophils 63 Not Estab. %   Lymphs 25 Not  Estab. %   Monocytes 8 Not Estab. %   Eos 3 Not Estab. %   Basos 1 Not Estab. %   Neutrophils Absolute 3.1 1.4 - 7.0 x10E3/uL   Lymphocytes Absolute 1.2 0.7 - 3.1 x10E3/uL   Monocytes Absolute 0.4 0.1 - 0.9 x10E3/uL   EOS (ABSOLUTE) 0.1 0.0 - 0.4 x10E3/uL   Basophils Absolute 0.0 0.0 - 0.2 x10E3/uL   Immature Granulocytes 0 Not Estab. %   Immature Grans (Abs) 0.0 0.0 - 0.1 x10E3/uL          Assessment & Plan:  1. Well adult exam Adult wellness-complete.wellness physical was conducted today. Importance of diet and exercise were discussed in detail.  In addition to this a discussion regarding safety was also covered. We also reviewed over immunizations and gave recommendations  regarding current immunization needed for age.  In addition to this additional areas were also touched on including: Preventative health exams needed:  Colonoscopy UTD, next due in 2021 Mammogram UTD, next due 04/20 Pap Smear UTD, next due in 2021  Patient was advised yearly wellness exam. Recent lab work reviewed with patient.   2. Essential hypertension Well controlled on current medication, will continue. Encouraged diet and exercise. F/u in 6 months.  3. Depression with anxiety Well controlled on current medication, patient would like to continue. PHQ 9 reviewed. Denies SI/HI. Will refill. F/u in 6 months.  Dr. Sallee Lange was consulted on this case and is in agreement with the above treatment plan.

## 2018-08-24 ENCOUNTER — Other Ambulatory Visit (HOSPITAL_COMMUNITY): Payer: Self-pay | Admitting: Family Medicine

## 2018-08-24 DIAGNOSIS — Z1231 Encounter for screening mammogram for malignant neoplasm of breast: Secondary | ICD-10-CM

## 2018-10-06 ENCOUNTER — Ambulatory Visit (HOSPITAL_COMMUNITY): Payer: Medicare HMO

## 2018-10-18 ENCOUNTER — Ambulatory Visit (HOSPITAL_COMMUNITY)
Admission: RE | Admit: 2018-10-18 | Discharge: 2018-10-18 | Disposition: A | Discharge status: Home / Self Care | Payer: Medicare HMO | Source: Ambulatory Visit | Attending: Family Medicine | Admitting: Family Medicine

## 2018-10-18 ENCOUNTER — Other Ambulatory Visit: Payer: Self-pay

## 2018-10-18 DIAGNOSIS — Z1231 Encounter for screening mammogram for malignant neoplasm of breast: Secondary | ICD-10-CM | POA: Diagnosis not present

## 2018-10-25 DIAGNOSIS — R69 Illness, unspecified: Secondary | ICD-10-CM | POA: Diagnosis not present

## 2018-10-31 DIAGNOSIS — R69 Illness, unspecified: Secondary | ICD-10-CM | POA: Diagnosis not present

## 2018-11-05 ENCOUNTER — Other Ambulatory Visit: Payer: Self-pay | Admitting: Family Medicine

## 2018-11-06 NOTE — Telephone Encounter (Signed)
sched six mo ck up, then may fill times one

## 2018-11-06 NOTE — Telephone Encounter (Signed)
Left message to return call 

## 2018-11-08 ENCOUNTER — Ambulatory Visit (INDEPENDENT_AMBULATORY_CARE_PROVIDER_SITE_OTHER): Payer: Medicare HMO | Admitting: Family Medicine

## 2018-11-08 ENCOUNTER — Other Ambulatory Visit: Payer: Self-pay

## 2018-11-08 ENCOUNTER — Encounter: Payer: Self-pay | Admitting: Family Medicine

## 2018-11-08 DIAGNOSIS — I10 Essential (primary) hypertension: Secondary | ICD-10-CM | POA: Diagnosis not present

## 2018-11-08 DIAGNOSIS — R69 Illness, unspecified: Secondary | ICD-10-CM | POA: Diagnosis not present

## 2018-11-08 DIAGNOSIS — F418 Other specified anxiety disorders: Secondary | ICD-10-CM | POA: Diagnosis not present

## 2018-11-08 MED ORDER — INDAPAMIDE 1.25 MG PO TABS: ORAL_TABLET | ORAL | 0 refills | Status: DC

## 2018-11-08 MED ORDER — LISINOPRIL 20 MG PO TABS: ORAL_TABLET | ORAL | 0 refills | Status: DC

## 2018-11-08 MED ORDER — CITALOPRAM HYDROBROMIDE 20 MG PO TABS: ORAL_TABLET | ORAL | 0 refills | Status: DC

## 2018-11-08 MED ORDER — POTASSIUM CHLORIDE ER 10 MEQ PO TBCR: 10.0000 meq | EXTENDED_RELEASE_TABLET | Freq: Every day | ORAL | 0 refills | Status: DC

## 2018-11-08 NOTE — Addendum Note (Signed)
Addended by: Dairl Ponder on: 11/08/2018 11:57 AM   Modules accepted: Orders

## 2018-11-08 NOTE — Progress Notes (Signed)
   Subjective:  Audio 1  Patient ID: Michelle Landry, female    DOB: 01-28-1956, 63 y.o.   MRN: [id number]  Hypertension  This is a chronic problem. The current episode started more than 1 year ago. Risk factors for coronary artery disease include post-menopausal state. Treatments tried: lozol, lisinopril. There are no compliance problems.    BP-106/73  Virtual Visit via Video Note  I connected with [name] on 11/08/18 at 11:00 AM EDT by a video enabled telemedicine application and verified that I am speaking with the correct person using two identifiers.  Location: Patient: home Provider: office   I discussed the limitations of evaluation and management by telemedicine and the availability of in person appointments. The patient expressed understanding and agreed to proceed.  History of Present Illness:    Observations/Objective:   Assessment and Plan:   Follow Up Instructions:    I discussed the assessment and treatment plan with the patient. The patient was provided an opportunity to ask questions and all were answered. The patient agreed with the plan and demonstrated an understanding of the instructions.   The patient was advised to call back or seek an in-person evaluation if the symptoms worsen or if the condition fails to improve as anticipated.  I provided minutes of non-face-to-face time during this encounter.   Blood pressure medicine and blood pressure levels reviewed today with patient. Compliant with blood pressure medicine. States does not miss a dose. No obvious side effects. Blood pressure generally good when checked elsewhere. Watching salt intake.   Patient notes ongoing compliance with antidepressant medication. No obvious side effects. Reports does not miss a dose. Overall continues to help depression substantially. No thoughts of homicide or suicide. Would like to maintain medication.     Review of Systems No headache, no major weight loss or  weight gain, no chest pain no back pain abdominal pain no change in bowel habits complete ROS otherwise negative     Objective:   Physical Exam   Virtual visit     Assessment & Plan:  Impression hypertension clinically good control discussed to maintain same meds  2.  History of depression.  Patient improved.  Would like to wean off medications will decrease from 1-1/2-1 nightly for a month and one half nightly for a month then try to stop.  Exercise and diet encouraged follow-up 6 months for wellness

## 2018-11-09 ENCOUNTER — Ambulatory Visit: Payer: Medicare HMO | Admitting: Family Medicine

## 2018-11-24 ENCOUNTER — Telehealth: Payer: Self-pay | Admitting: Family Medicine

## 2018-11-24 ENCOUNTER — Other Ambulatory Visit: Payer: Self-pay

## 2018-11-24 ENCOUNTER — Ambulatory Visit (INDEPENDENT_AMBULATORY_CARE_PROVIDER_SITE_OTHER): Payer: Medicare HMO | Admitting: Family Medicine

## 2018-11-24 DIAGNOSIS — J329 Chronic sinusitis, unspecified: Secondary | ICD-10-CM

## 2018-11-24 DIAGNOSIS — J310 Chronic rhinitis: Secondary | ICD-10-CM

## 2018-11-24 NOTE — Progress Notes (Signed)
   Subjective:    Patient ID: Michelle Landry, female    DOB: 05/24/1956, 63 y.o.   MRN: [id number] Audio plus video Sinus Problem This is a new problem. The current episode started in the past 7 days. Associated symptoms include congestion, coughing, headaches and sinus pressure. (Ears have fluid) Treatments tried: tylenol.   Virtual Visit via Video Note  I connected with Michelle Landry on 11/24/18 at  4:20 PM EDT by a video enabled telemedicine application and verified that I am speaking with the correct person using two identifiers.  Location: Patient: home Provider: office   I discussed the limitations of evaluation and management by telemedicine and the availability of in person appointments. The patient expressed understanding and agreed to proceed.  History of Present Illness:    Observations/Objective:   Assessment and Plan:   Follow Up Instructions:    I discussed the assessment and treatment plan with the patient. The patient was provided an opportunity to ask questions and all were answered. The patient agreed with the plan and demonstrated an understanding of the instructions.   The patient was advised to call back or seek an in-person evaluation if the symptoms worsen or if the condition fails to improve as anticipated.  I provided 18 minutes of non-face-to-face time during this encounter.  Was stopped up  Left ear pain  Using tylenol  Some   No cough no fever       Review of Systems  HENT: Positive for congestion and sinus pressure.   Respiratory: Positive for cough.   Neurological: Positive for headaches.       Objective:   Physical Exam   Virtual    Assessment & Plan:  Probable sinusitis.  Discussed.  No known exposures.  Small potential for coronavirus discussed warning signs discussed in that regard.  Antibiotics prescribed

## 2018-11-24 NOTE — Telephone Encounter (Signed)
Please advise. Thank you

## 2018-11-24 NOTE — Telephone Encounter (Signed)
Call and do 4 20 virt

## 2018-11-24 NOTE — Telephone Encounter (Signed)
Sinus pressure and headache for 3 days now. No other symptoms No available appts left for today. Can we call in an antibiotic?  [facility]

## 2018-11-24 NOTE — Telephone Encounter (Signed)
Contacted pt. Pt informed that Dr.Steve would like to do a virtual visit at 4:20 today. Pt verbalized understanding and transferred up front to be placed on schedule.

## 2019-02-09 ENCOUNTER — Other Ambulatory Visit: Payer: Self-pay | Admitting: Family Medicine

## 2019-02-27 ENCOUNTER — Other Ambulatory Visit: Payer: Self-pay

## 2019-02-27 ENCOUNTER — Other Ambulatory Visit: Payer: Self-pay | Admitting: Family Medicine

## 2019-02-27 ENCOUNTER — Ambulatory Visit (INDEPENDENT_AMBULATORY_CARE_PROVIDER_SITE_OTHER): Payer: Medicare HMO | Admitting: Family Medicine

## 2019-02-27 DIAGNOSIS — F411 Generalized anxiety disorder: Secondary | ICD-10-CM

## 2019-02-27 DIAGNOSIS — R69 Illness, unspecified: Secondary | ICD-10-CM | POA: Diagnosis not present

## 2019-02-27 MED ORDER — CITALOPRAM HYDROBROMIDE 20 MG PO TABS: ORAL_TABLET | ORAL | 1 refills | Status: DC

## 2019-02-27 NOTE — Progress Notes (Signed)
   Subjective:  Audio only  Patient ID: Michelle Landry, female    DOB: 09-20-1955, 63 y.o.   MRN: [id number]  HPIAnxiety. Pt states she wanted to come off of citalopram and she stopped taking it in July but now wants to go back on it.  GAD7 done.   Virtual Visit via Telephone Note  I connected with Michelle Landry on 02/27/19 at  8:40 AM EDT by telephone and verified that I am speaking with the correct person using two identifiers.  Location: Patient: home Provider: office   I discussed the limitations, risks, security and privacy concerns of performing an evaluation and management service by telephone and the availability of in person appointments. I also discussed with the patient that there may be a patient responsible charge related to this service. The patient expressed understanding and agreed to proceed.   History of Present Illness:    Observations/Objective:   Assessment and Plan:   Follow Up Instructions:    I discussed the assessment and treatment plan with the patient. The patient was provided an opportunity to ask questions and all were answered. The patient agreed with the plan and demonstrated an understanding of the instructions.   The patient was advised to call back or seek an in-person evaluation if the symptoms worsen or if the condition fails to improve as anticipated.  I provided 17 minutes of non-face-to-face time during this encounter.      Review of Systems No headache no chest pain no back pain    Objective:   Physical Exam  Virtual      Assessment & Plan:  Impression generalized anxiety disorder.  Patient try to get off Celexa this summer.  No serotonin withdrawal symptoms.  Was off meds completely for a month.  Then started having more difficulties with anxiety.  See GAD 7 score.  Fairly substantial challenges across the board.  Will resume Celexa rationale discussed encouraged long-term use

## 2019-03-15 DIAGNOSIS — R69 Illness, unspecified: Secondary | ICD-10-CM | POA: Diagnosis not present

## 2019-04-24 ENCOUNTER — Telehealth: Payer: Self-pay | Admitting: Family Medicine

## 2019-04-24 DIAGNOSIS — I10 Essential (primary) hypertension: Secondary | ICD-10-CM

## 2019-04-24 DIAGNOSIS — Z13220 Encounter for screening for lipoid disorders: Secondary | ICD-10-CM

## 2019-04-24 DIAGNOSIS — Z79899 Other long term (current) drug therapy: Secondary | ICD-10-CM

## 2019-04-24 DIAGNOSIS — R5383 Other fatigue: Secondary | ICD-10-CM

## 2019-04-24 NOTE — Telephone Encounter (Signed)
Pt has CPE scheduled 12/23 with Hoyle Sauer and would like to have lab work done before appt.

## 2019-04-25 NOTE — Telephone Encounter (Signed)
Blood work ordered in [employer]. Telephone call no answer to notify patient.

## 2019-04-25 NOTE — Telephone Encounter (Signed)
Lipid, CMP, TSH. Thanks!

## 2019-04-26 NOTE — Telephone Encounter (Signed)
Patient notified

## 2019-05-22 DIAGNOSIS — Z79899 Other long term (current) drug therapy: Secondary | ICD-10-CM | POA: Diagnosis not present

## 2019-05-22 DIAGNOSIS — Z13220 Encounter for screening for lipoid disorders: Secondary | ICD-10-CM | POA: Diagnosis not present

## 2019-05-22 DIAGNOSIS — I10 Essential (primary) hypertension: Secondary | ICD-10-CM | POA: Diagnosis not present

## 2019-05-22 DIAGNOSIS — R5383 Other fatigue: Secondary | ICD-10-CM | POA: Diagnosis not present

## 2019-05-23 LAB — COMPREHENSIVE METABOLIC PANEL
ALT: 22 IU/L (ref 0–32)
AST: 20 IU/L (ref 0–40)
Albumin/Globulin Ratio: 2 (ref 1.2–2.2)
Albumin: 4.2 g/dL (ref 3.8–4.8)
Alkaline Phosphatase: 82 IU/L (ref 39–117)
BUN/Creatinine Ratio: 11 — ABNORMAL LOW (ref 12–28)
BUN: 8 mg/dL (ref 8–27)
Bilirubin Total: 0.5 mg/dL (ref 0.0–1.2)
CO2: 26 mmol/L (ref 20–29)
Calcium: 9.5 mg/dL (ref 8.7–10.3)
Chloride: 103 mmol/L (ref 96–106)
Creatinine, Ser: 0.7 mg/dL (ref 0.57–1.00)
GFR calc Af Amer: 106 mL/min/{1.73_m2} (ref >59)
GFR calc non Af Amer: 92 mL/min/{1.73_m2} (ref >59)
Globulin, Total: 2.1 g/dL (ref 1.5–4.5)
Glucose: 89 mg/dL (ref 65–99)
Potassium: 3.7 mmol/L (ref 3.5–5.2)
Sodium: 142 mmol/L (ref 134–144)
Total Protein: 6.3 g/dL (ref 6.0–8.5)

## 2019-05-23 LAB — LIPID PANEL
Chol/HDL Ratio: 4 ratio (ref 0.0–4.4)
Cholesterol, Total: 176 mg/dL (ref 100–199)
HDL: 44 mg/dL (ref >39)
LDL Chol Calc (NIH): 109 mg/dL — ABNORMAL HIGH (ref 0–99)
Triglycerides: 130 mg/dL (ref 0–149)
VLDL Cholesterol Cal: 23 mg/dL (ref 5–40)

## 2019-05-23 LAB — TSH: TSH: 1.68 u[IU]/mL (ref 0.450–4.500)

## 2019-05-30 ENCOUNTER — Ambulatory Visit (INDEPENDENT_AMBULATORY_CARE_PROVIDER_SITE_OTHER): Payer: Medicare HMO | Admitting: Nurse Practitioner

## 2019-05-30 ENCOUNTER — Encounter: Payer: Self-pay | Admitting: Nurse Practitioner

## 2019-05-30 ENCOUNTER — Other Ambulatory Visit: Payer: Self-pay

## 2019-05-30 VITALS — BP 126/82 | Temp 97.7°F | Ht 65.5 in | Wt 177.6 lb

## 2019-05-30 DIAGNOSIS — Z01419 Encounter for gynecological examination (general) (routine) without abnormal findings: Secondary | ICD-10-CM | POA: Diagnosis not present

## 2019-05-30 DIAGNOSIS — Z780 Asymptomatic menopausal state: Secondary | ICD-10-CM | POA: Diagnosis not present

## 2019-05-30 DIAGNOSIS — M810 Age-related osteoporosis without current pathological fracture: Secondary | ICD-10-CM | POA: Diagnosis not present

## 2019-05-30 NOTE — Progress Notes (Signed)
Subjective:    Patient ID: Michelle Landry, female    DOB: [birth year], 63 y.o.   MRN: [id number]  HPI Patient presents today for annual wellness exam. Patient denies any complaints or concerns. Patient reports receiving regular dental exams and due for annual eye examination.   Past Medical History:  Diagnosis Date  . Allergic rhinitis   . History of bronchitis   . Hypertension   . Osteoporosis 2012   Allergies  Allergen Reactions  . Sulfa Antibiotics     Upset stomach - no allergic reaction   Sexual Health: Patient denies any sexual partners.  Last Pap 2018  Review of Systems  General: Patient reports adequate energy level and regular sleeping pattern. Patient denies weight loss or weight gain.  HEENT: Patient denies vision changes, hearing changes, or nasal congestion. Patient denies sore throat or difficulty swallowing.  Cardiac: Patient denies chest pain, pressure, or palpitations. Patient denies Syncope or dizziness.  Pulmonary: Patient denies shortness of breath, dyspnea, or cough.  GI: Patient denies reflux or N/V/D and reports regular bowel movements.  GU: Patinet denies frequency, urgency, or change in color or odor of urine.  Breast: Patient denies tenderness, pain, or swelling. Patient denies nipple discharge, swelling, or tenderness.  GYN: no vaginal bleeding, discharge or pelvic pain. No lesions or rash.      Objective:   Physical Exam  General: WDWN. Appears calm and pleasant demeanor.   Ears: R ear clear fluid without erythema or exudate. Left ear minimal wax without erythema. TM intact BIL with bony landmarks visible.  Thyroid: midline without visible enlargement, nodules, or tenderness on palpation.  Lymph: cervical lymph nodes without enlargement, or tenderness. Pulmonary: No adventitious sounds on auscultation. Chest expansion symmetrical Cardiovascular: RRR. S1, S2 intact. No murmurs. No peripheral edema.  Abdomen: Flat, soft, non-tender. No masses,  tenderness or organomegaly on palpation.  Breast: breasts symmetrical and smooth without nodules or masses. Nipples symmetrical without discharge or deformity. No tenderness noted to palpation. Axilla lymph nodes without enlargement or tenderness.  GYN: External genitalia without erythema, lesions, swelling or masses. Urethra without erythema, and midline. Bimanual exam performed with one finger due to patient hymen intact. Uterus midline, and without enlargement or masses.  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  Recent Results (from the past 2160 hour(s))  Lipid Profile     Status: Abnormal   Collection Time: 05/22/19  8:28 AM  Result Value Ref Range   Cholesterol, Total 176 100 - 199 mg/dL   Triglycerides 130 0 - 149 mg/dL   HDL 44 >39 mg/dL   VLDL Cholesterol Cal 23 5 - 40 mg/dL   LDL Chol Calc (NIH) 109 (H) 0 - 99 mg/dL   Chol/HDL Ratio 4.0 0.0 - 4.4 ratio    Comment:                                   T. Chol/HDL Ratio                                             Men  Women                               1/2 Avg.Risk  3.4    3.3  Avg.Risk  5.0    4.4                                2X Avg.Risk  9.6    7.1                                3X Avg.Risk 23.4   11.0   Comprehensive metabolic panel     Status: Abnormal   Collection Time: 05/22/19  8:28 AM  Result Value Ref Range   Glucose 89 65 - 99 mg/dL   BUN 8 8 - 27 mg/dL   Creatinine, Ser 0.70 0.57 - 1.00 mg/dL   GFR calc non Af Amer 92 >59 mL/min/1.73   GFR calc Af Amer 106 >59 mL/min/1.73   BUN/Creatinine Ratio 11 (L) 12 - 28   Sodium 142 134 - 144 mmol/L   Potassium 3.7 3.5 - 5.2 mmol/L   Chloride 103 96 - 106 mmol/L   CO2 26 20 - 29 mmol/L   Calcium 9.5 8.7 - 10.3 mg/dL   Total Protein 6.3 6.0 - 8.5 g/dL   Albumin 4.2 3.8 - 4.8 g/dL   Globulin, Total 2.1 1.5 - 4.5 g/dL   Albumin/Globulin Ratio 2.0 1.2 - 2.2   Bilirubin Total 0.5 0.0 - 1.2 mg/dL   Alkaline Phosphatase 82 39  - 117 IU/L   AST 20 0 - 40 IU/L   ALT 22 0 - 32 IU/L  TSH     Status: None   Collection Time: 05/22/19  8:28 AM  Result Value Ref Range   TSH 1.680 0.450 - 4.500 uIU/mL   Results reviewed with patient.       Assessment & Plan:   Problem List Items Addressed This Visit      Musculoskeletal and Integument   Osteoporosis   Relevant Orders   DG Bone Density    Other Visit Diagnoses    Well woman exam    -  Primary   Post-menopausal       Relevant Orders   DG Bone Density      Wellness Exam -labs: TSH, CMP, Lipid collected last week and results reviewed with patient.  -Tests: Dexascan to be scheduled today.  Encouraged continued activity as tolerated.  -Follow up: F/u in 1 year for annual physical

## 2019-05-30 NOTE — Patient Instructions (Addendum)
You can use Cerave to help with your dry skin.

## 2019-05-30 NOTE — Progress Notes (Signed)
   Subjective:    Patient ID: Michelle Landry, female    DOB: 1955/10/14, 63 y.o.   MRN: [id number]  HPI  The patient comes in today for a wellness visit.    A review of their health history was completed.  A review of medications was also completed.  Any needed refills; yes  Eating habits: could be better  Falls/  MVA accidents in past few months: none  Regular exercise: working in house and yard  Specialist pt sees on regular basis: none  Preventative health issues were discussed.   Additional concerns: arthritis   Review of Systems     Objective:   Physical Exam        Assessment & Plan:

## 2019-06-11 ENCOUNTER — Other Ambulatory Visit (HOSPITAL_COMMUNITY): Payer: Medicare HMO

## 2019-06-15 ENCOUNTER — Encounter: Payer: Medicare HMO | Admitting: Nurse Practitioner

## 2019-06-26 DIAGNOSIS — R69 Illness, unspecified: Secondary | ICD-10-CM | POA: Diagnosis not present

## 2019-06-27 ENCOUNTER — Other Ambulatory Visit: Payer: Self-pay

## 2019-06-27 ENCOUNTER — Ambulatory Visit (HOSPITAL_COMMUNITY)
Admission: RE | Admit: 2019-06-27 | Discharge: 2019-06-27 | Disposition: A | Discharge status: Home / Self Care | Payer: Medicare HMO | Source: Ambulatory Visit | Attending: Nurse Practitioner | Admitting: Nurse Practitioner

## 2019-06-27 DIAGNOSIS — M8589 Other specified disorders of bone density and structure, multiple sites: Secondary | ICD-10-CM | POA: Diagnosis not present

## 2019-06-27 DIAGNOSIS — Z780 Asymptomatic menopausal state: Secondary | ICD-10-CM | POA: Insufficient documentation

## 2019-06-27 DIAGNOSIS — M810 Age-related osteoporosis without current pathological fracture: Secondary | ICD-10-CM | POA: Insufficient documentation

## 2019-07-04 DIAGNOSIS — R69 Illness, unspecified: Secondary | ICD-10-CM | POA: Diagnosis not present

## 2019-07-12 ENCOUNTER — Encounter: Payer: Self-pay | Admitting: Family Medicine

## 2019-08-05 ENCOUNTER — Other Ambulatory Visit: Payer: Self-pay | Admitting: Family Medicine

## 2019-08-21 ENCOUNTER — Other Ambulatory Visit: Payer: Self-pay | Admitting: Family Medicine

## 2019-09-10 ENCOUNTER — Other Ambulatory Visit (HOSPITAL_COMMUNITY): Payer: Self-pay | Admitting: Family Medicine

## 2019-09-10 DIAGNOSIS — Z1231 Encounter for screening mammogram for malignant neoplasm of breast: Secondary | ICD-10-CM

## 2019-10-16 DIAGNOSIS — R69 Illness, unspecified: Secondary | ICD-10-CM | POA: Diagnosis not present

## 2019-10-31 DIAGNOSIS — R69 Illness, unspecified: Secondary | ICD-10-CM | POA: Diagnosis not present

## 2019-11-01 ENCOUNTER — Other Ambulatory Visit (HOSPITAL_COMMUNITY): Payer: Self-pay | Admitting: Nurse Practitioner

## 2019-11-01 ENCOUNTER — Ambulatory Visit (HOSPITAL_COMMUNITY)
Admission: RE | Admit: 2019-11-01 | Discharge: 2019-11-01 | Disposition: A | Discharge status: Home / Self Care | Payer: Medicare HMO | Source: Ambulatory Visit | Attending: Family Medicine | Admitting: Family Medicine

## 2019-11-01 ENCOUNTER — Other Ambulatory Visit: Payer: Self-pay

## 2019-11-01 DIAGNOSIS — R928 Other abnormal and inconclusive findings on diagnostic imaging of breast: Secondary | ICD-10-CM

## 2019-11-01 DIAGNOSIS — Z1231 Encounter for screening mammogram for malignant neoplasm of breast: Secondary | ICD-10-CM | POA: Diagnosis not present

## 2019-11-06 ENCOUNTER — Other Ambulatory Visit: Payer: Self-pay | Admitting: Family Medicine

## 2019-11-06 DIAGNOSIS — R928 Other abnormal and inconclusive findings on diagnostic imaging of breast: Secondary | ICD-10-CM

## 2019-11-06 NOTE — Progress Notes (Signed)
Mammo showing some irregularity on left breast.   Pls let pt know ultrasound and mammo ordered for abnromal mammo on left breast.   Thx,   Dr. Lovena Le

## 2019-11-06 NOTE — Progress Notes (Signed)
abnromal mammo on 11/01/19 on left breast.  Ordered diag mammo and ultraosund.   Dr. Lovena Le

## 2019-11-13 ENCOUNTER — Ambulatory Visit (HOSPITAL_COMMUNITY)
Admission: RE | Admit: 2019-11-13 | Discharge: 2019-11-13 | Disposition: A | Discharge status: Home / Self Care | Payer: Medicare HMO | Source: Ambulatory Visit | Attending: Nurse Practitioner | Admitting: Nurse Practitioner

## 2019-11-13 ENCOUNTER — Other Ambulatory Visit: Payer: Self-pay

## 2019-11-13 DIAGNOSIS — R922 Inconclusive mammogram: Secondary | ICD-10-CM | POA: Diagnosis not present

## 2019-11-13 DIAGNOSIS — R928 Other abnormal and inconclusive findings on diagnostic imaging of breast: Secondary | ICD-10-CM | POA: Insufficient documentation

## 2019-11-19 ENCOUNTER — Other Ambulatory Visit: Payer: Self-pay | Admitting: Family Medicine

## 2019-11-19 NOTE — Telephone Encounter (Signed)
Scheduled 6/30

## 2019-11-19 NOTE — Telephone Encounter (Signed)
Please schedule visit and then route back to nurses to send to dr taylor. Last visit was wellness dec 2020

## 2019-12-05 ENCOUNTER — Encounter: Payer: Self-pay | Admitting: Family Medicine

## 2019-12-05 ENCOUNTER — Ambulatory Visit (INDEPENDENT_AMBULATORY_CARE_PROVIDER_SITE_OTHER): Payer: Medicare HMO | Admitting: Family Medicine

## 2019-12-05 ENCOUNTER — Other Ambulatory Visit: Payer: Self-pay

## 2019-12-05 VITALS — BP 138/84 | HR 95 | Temp 97.0°F | Ht 65.5 in | Wt 185.8 lb

## 2019-12-05 DIAGNOSIS — Z1329 Encounter for screening for other suspected endocrine disorder: Secondary | ICD-10-CM | POA: Diagnosis not present

## 2019-12-05 DIAGNOSIS — E669 Obesity, unspecified: Secondary | ICD-10-CM | POA: Diagnosis not present

## 2019-12-05 DIAGNOSIS — I10 Essential (primary) hypertension: Secondary | ICD-10-CM

## 2019-12-05 DIAGNOSIS — Z23 Encounter for immunization: Secondary | ICD-10-CM

## 2019-12-05 NOTE — Patient Instructions (Signed)

## 2019-12-05 NOTE — Progress Notes (Signed)
Patient ID: Michelle Landry, female    DOB: 01/22/1956, 64 y.o.   MRN: [id number]   Chief Complaint  Patient presents with  . Hypertension    Pt here for follow up on blood pressure. pt not having any issues with HTN. does check bp outside of outside at times. Taking Indapamide and Lisionpril on a daily basis. Pt would like to know what kind of diet is best for her.    Subjective:    HPI   CC- f/u HTN  HTN Pt compliant with BP meds.  No SEs Denies chest pain, sob, or blurry vision. 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  Pt stating her diet not working anymore. Wanting help with diet and exercising. Has been trying to inc exercising on treadmill at home with 5-6x per week for past month. Feeling her diet changes in past aren't working for her now.   Medical History Michelle Landry has a past medical history of Allergic rhinitis, History of bronchitis, Hypertension, and Osteoporosis (2012).   Outpatient Encounter Medications as of 12/05/2019  Medication Sig  . alendronate (FOSAMAX) 70 MG tablet TAKE 1 TABLET BY MOUTH ONCE A WEEK. TAKE WITH A FULL GLASS OF WATER ON AN EMPTY STOMACH.  . citalopram (CELEXA) 20 MG tablet TAKE 1 TABLET BY MOUTH ONCE DAILY AT BEDTIME  . indapamide (LOZOL) 1.25 MG tablet TAKE 1 TABLET BY MOUTH IN THE MORNING FOR BLOOD PRESSURE AND FOR FLUID  . lisinopril (ZESTRIL) 20 MG tablet TAKE 1 TABLET BY MOUTH ONCE DAILY IN THE MORNING  . potassium chloride (KLOR-CON) 10 MEQ tablet Take 1 tablet by mouth once daily   No facility-administered encounter medications on file as of 12/05/2019.     Review of Systems  Constitutional: Negative for chills and fever.  HENT: Negative for congestion, rhinorrhea and sore throat.   Respiratory: Negative for cough, shortness of breath and wheezing.   Cardiovascular: Negative for chest pain and leg swelling.  Gastrointestinal: Negative for abdominal pain, diarrhea, nausea and vomiting.  Genitourinary: Negative for dysuria  and frequency.  Musculoskeletal: Negative for arthralgias and back pain.  Skin: Negative for rash.  Neurological: Negative for dizziness, weakness and headaches.     Vitals BP 138/84   Pulse 95   Temp (!) 97 F (36.1 C)   Ht 5' 5.5" (1.664 m)   Wt 185 lb 12.8 oz (84.3 kg)   SpO2 97%   BMI 30.45 kg/m   Objective:   Physical Exam Vitals and nursing note reviewed.  Constitutional:      Appearance: Normal appearance.  HENT:     Head: Normocephalic and atraumatic.     Nose: Nose normal.     Mouth/Throat:     Mouth: Mucous membranes are moist.     Pharynx: Oropharynx is clear.  Eyes:     Extraocular Movements: Extraocular movements intact.     Conjunctiva/sclera: Conjunctivae normal.     Pupils: Pupils are equal, round, and reactive to light.  Cardiovascular:     Rate and Rhythm: Normal rate and regular rhythm.     Pulses: Normal pulses.     Heart sounds: Normal heart sounds.  Pulmonary:     Effort: Pulmonary effort is normal.     Breath sounds: Normal breath sounds. No wheezing, rhonchi or rales.  Musculoskeletal:        General: Normal range of motion.     Right lower leg: No edema.     Left lower leg: Edema (chronic)  present.  Skin:    General: Skin is warm and dry.     Findings: No lesion or rash.  Neurological:     General: No focal deficit present.     Mental Status: She is alert and oriented to person, place, and time.  Psychiatric:        Mood and Affect: Mood normal.        Behavior: Behavior normal.      Assessment and Plan   1. Essential hypertension - CBC - CMP14+EGFR - Lipid panel  2. Thyroid disorder screen - TSH  3. Need for pneumococcal vaccine - Pneumococcal conjugate vaccine 13-valent  4. Obesity (BMI 30.0-34.9) - TSH   LLE-edema, likely related to the history of left knee surgery in the past.  Pt has refills for now, pending her labs, before more refills.   Gave pt calorie counter handout and reviewed eating more protein,  veggies, water, and dec carb intake.  Eating more whole grains. Inc exercising 3-5x per week at least 44mns each.  F/u 681mor prn.

## 2019-12-06 DIAGNOSIS — Z1329 Encounter for screening for other suspected endocrine disorder: Secondary | ICD-10-CM | POA: Diagnosis not present

## 2019-12-06 DIAGNOSIS — I10 Essential (primary) hypertension: Secondary | ICD-10-CM | POA: Diagnosis not present

## 2019-12-07 LAB — CBC
Hematocrit: 43.8 % (ref 34.0–46.6)
Hemoglobin: 13.8 g/dL (ref 11.1–15.9)
MCH: 28.5 pg (ref 26.6–33.0)
MCHC: 31.5 g/dL (ref 31.5–35.7)
MCV: 91 fL (ref 79–97)
Platelets: 248 10*3/uL (ref 150–450)
RBC: 4.84 x10E6/uL (ref 3.77–5.28)
RDW: 13.3 % (ref 11.7–15.4)
WBC: 6.1 10*3/uL (ref 3.4–10.8)

## 2019-12-07 LAB — CMP14+EGFR
ALT: 26 IU/L (ref 0–32)
AST: 22 IU/L (ref 0–40)
Albumin/Globulin Ratio: 2 (ref 1.2–2.2)
Albumin: 4.5 g/dL (ref 3.8–4.8)
Alkaline Phosphatase: 92 IU/L (ref 48–121)
BUN/Creatinine Ratio: 14 (ref 12–28)
BUN: 10 mg/dL (ref 8–27)
Bilirubin Total: 0.8 mg/dL (ref 0.0–1.2)
CO2: 25 mmol/L (ref 20–29)
Calcium: 9.2 mg/dL (ref 8.7–10.3)
Chloride: 99 mmol/L (ref 96–106)
Creatinine, Ser: 0.72 mg/dL (ref 0.57–1.00)
GFR calc Af Amer: 102 mL/min/{1.73_m2} (ref >59)
GFR calc non Af Amer: 88 mL/min/{1.73_m2} (ref >59)
Globulin, Total: 2.3 g/dL (ref 1.5–4.5)
Glucose: 96 mg/dL (ref 65–99)
Potassium: 4.1 mmol/L (ref 3.5–5.2)
Sodium: 138 mmol/L (ref 134–144)
Total Protein: 6.8 g/dL (ref 6.0–8.5)

## 2019-12-07 LAB — LIPID PANEL
Chol/HDL Ratio: 4.5 ratio — ABNORMAL HIGH (ref 0.0–4.4)
Cholesterol, Total: 184 mg/dL (ref 100–199)
HDL: 41 mg/dL (ref >39)
LDL Chol Calc (NIH): 114 mg/dL — ABNORMAL HIGH (ref 0–99)
Triglycerides: 162 mg/dL — ABNORMAL HIGH (ref 0–149)
VLDL Cholesterol Cal: 29 mg/dL (ref 5–40)

## 2019-12-07 LAB — TSH: TSH: 1.71 u[IU]/mL (ref 0.450–4.500)

## 2020-02-04 ENCOUNTER — Telehealth: Payer: Self-pay | Admitting: Family Medicine

## 2020-02-04 MED ORDER — ALENDRONATE SODIUM 70 MG PO TABS: ORAL_TABLET | ORAL | 6 refills | Status: DC

## 2020-02-04 NOTE — Telephone Encounter (Signed)
[facility] requesting refill on Alendronate 70 mg tab. Take one tablet po once a week. Pt last seen 12/05/19 for HTN. Please advise. Thank you

## 2020-02-04 NOTE — Addendum Note (Signed)
Addended by: Erven Colla on: 02/04/2020 05:08 PM   Modules accepted: Orders

## 2020-02-27 ENCOUNTER — Other Ambulatory Visit: Payer: Self-pay | Admitting: Family Medicine

## 2020-03-26 DIAGNOSIS — R69 Illness, unspecified: Secondary | ICD-10-CM | POA: Diagnosis not present

## 2020-05-06 DIAGNOSIS — R69 Illness, unspecified: Secondary | ICD-10-CM | POA: Diagnosis not present

## 2020-05-25 ENCOUNTER — Other Ambulatory Visit: Payer: Self-pay | Admitting: Family Medicine

## 2020-05-26 ENCOUNTER — Telehealth: Payer: Self-pay | Admitting: Family Medicine

## 2020-05-26 ENCOUNTER — Encounter: Payer: Self-pay | Admitting: Internal Medicine

## 2020-05-26 DIAGNOSIS — I10 Essential (primary) hypertension: Secondary | ICD-10-CM

## 2020-05-26 NOTE — Telephone Encounter (Signed)
Last labs 12/27/19: Lipid, CMP, CBC, TSH

## 2020-05-26 NOTE — Telephone Encounter (Signed)
Patient has appointment for 6 month follow up on anxiety and wanting to know if she needs labs done before her visit. Please advise

## 2020-05-26 NOTE — Telephone Encounter (Signed)
Cmp only. Thx. Dr .Lovena Le

## 2020-05-27 NOTE — Telephone Encounter (Signed)
Blood work ordered in Epic. Telephone call no answer 

## 2020-05-27 NOTE — Telephone Encounter (Signed)
Patient notified

## 2020-05-28 DIAGNOSIS — I10 Essential (primary) hypertension: Secondary | ICD-10-CM | POA: Diagnosis not present

## 2020-05-29 LAB — COMPREHENSIVE METABOLIC PANEL
ALT: 12 IU/L (ref 0–32)
AST: 13 IU/L (ref 0–40)
Albumin/Globulin Ratio: 2.3 — ABNORMAL HIGH (ref 1.2–2.2)
Albumin: 4.6 g/dL (ref 3.8–4.8)
Alkaline Phosphatase: 90 IU/L (ref 44–121)
BUN/Creatinine Ratio: 16 (ref 12–28)
BUN: 10 mg/dL (ref 8–27)
Bilirubin Total: 0.6 mg/dL (ref 0.0–1.2)
CO2: 25 mmol/L (ref 20–29)
Calcium: 9.8 mg/dL (ref 8.7–10.3)
Chloride: 99 mmol/L (ref 96–106)
Creatinine, Ser: 0.62 mg/dL (ref 0.57–1.00)
GFR calc Af Amer: 109 mL/min/{1.73_m2} (ref >59)
GFR calc non Af Amer: 95 mL/min/{1.73_m2} (ref >59)
Globulin, Total: 2 g/dL (ref 1.5–4.5)
Glucose: 95 mg/dL (ref 65–99)
Potassium: 3.9 mmol/L (ref 3.5–5.2)
Sodium: 140 mmol/L (ref 134–144)
Total Protein: 6.6 g/dL (ref 6.0–8.5)

## 2020-06-03 ENCOUNTER — Encounter: Payer: Self-pay | Admitting: Family Medicine

## 2020-06-03 ENCOUNTER — Ambulatory Visit (INDEPENDENT_AMBULATORY_CARE_PROVIDER_SITE_OTHER): Payer: Medicare HMO | Admitting: Family Medicine

## 2020-06-03 ENCOUNTER — Other Ambulatory Visit: Payer: Self-pay

## 2020-06-03 VITALS — BP 138/78 | HR 65 | Temp 97.6°F | Wt 174.4 lb

## 2020-06-03 DIAGNOSIS — R69 Illness, unspecified: Secondary | ICD-10-CM | POA: Diagnosis not present

## 2020-06-03 DIAGNOSIS — F418 Other specified anxiety disorders: Secondary | ICD-10-CM

## 2020-06-03 DIAGNOSIS — M810 Age-related osteoporosis without current pathological fracture: Secondary | ICD-10-CM

## 2020-06-03 DIAGNOSIS — I10 Essential (primary) hypertension: Secondary | ICD-10-CM | POA: Diagnosis not present

## 2020-06-03 MED ORDER — ALENDRONATE SODIUM 70 MG PO TABS: ORAL_TABLET | ORAL | 1 refills | Status: DC

## 2020-06-03 MED ORDER — LISINOPRIL 20 MG PO TABS: ORAL_TABLET | ORAL | 1 refills | Status: DC

## 2020-06-03 MED ORDER — CITALOPRAM HYDROBROMIDE 20 MG PO TABS: 20.0000 mg | ORAL_TABLET | Freq: Every day | ORAL | 1 refills | Status: DC

## 2020-06-03 MED ORDER — INDAPAMIDE 1.25 MG PO TABS: ORAL_TABLET | ORAL | 1 refills | Status: DC

## 2020-06-03 MED ORDER — POTASSIUM CHLORIDE ER 10 MEQ PO TBCR: 10.0000 meq | EXTENDED_RELEASE_TABLET | Freq: Every day | ORAL | 1 refills | Status: DC

## 2020-06-03 NOTE — Progress Notes (Signed)
Patient ID: Michelle Landry, female    DOB: January 26, 1956, 64 y.o.   MRN: [id number]   Chief Complaint  Patient presents with  . Hypertension  . Anxiety   Subjective:    HPI  Pt seen for f/u htn, and depression.  Pt doing well.   Doing well and lost about 9 lbs.  Then got down to 169lb now at 174 lbs.  Feeling not on diet last few ws.  Eating less counting calories. Doing some walking.  HTN Pt compliant with BP meds.  No SEs Denies chest pain, sob, LE swelling, or blurry vision.  Medical History Michelle Landry has a past medical history of Allergic rhinitis, History of bronchitis, Hypertension, and Osteoporosis (2012).   Outpatient Encounter Medications as of 06/03/2020  Medication Sig  . alendronate (FOSAMAX) 70 MG tablet TAKE 1 TABLET BY MOUTH ONCE A WEEK. TAKE WITH A FULL GLASS OF WATER ON AN EMPTY STOMACH.  . citalopram (CELEXA) 20 MG tablet TAKE 1 TABLET BY MOUTH ONCE DAILY AT BEDTIME  . indapamide (LOZOL) 1.25 MG tablet TAKE 1 TABLET BY MOUTH IN THE MORNING FOR BLOOD PRESSURE AND FOR FLUID  . lisinopril (ZESTRIL) 20 MG tablet TAKE 1 TABLET BY MOUTH ONCE DAILY IN THE MORNING  . potassium chloride (KLOR-CON) 10 MEQ tablet Take 1 tablet by mouth once daily  . [DISCONTINUED] alendronate (FOSAMAX) 70 MG tablet TAKE 1 TABLET BY MOUTH ONCE A WEEK. TAKE WITH A FULL GLASS OF WATER ON AN EMPTY STOMACH.   No facility-administered encounter medications on file as of 06/03/2020.     Review of Systems  Constitutional: Negative for chills and fever.  HENT: Negative for congestion, rhinorrhea and sore throat.   Respiratory: Negative for cough, shortness of breath and wheezing.   Cardiovascular: Negative for chest pain and leg swelling.  Gastrointestinal: Negative for abdominal pain, diarrhea, nausea and vomiting.  Genitourinary: Negative for dysuria and frequency.  Musculoskeletal: Negative for arthralgias and back pain.  Skin: Negative for rash.  Neurological: Negative for dizziness,  weakness and headaches.     Vitals BP 138/78   Pulse 65   Temp 97.6 F (36.4 C)   Wt 174 lb 6.4 oz (79.1 kg)   SpO2 98%   BMI 28.58 kg/m   Objective:   Physical Exam Vitals and nursing note reviewed.  Constitutional:      Appearance: Normal appearance.  HENT:     Head: Normocephalic and atraumatic.     Nose: Nose normal.     Mouth/Throat:     Mouth: Mucous membranes are moist.     Pharynx: Oropharynx is clear.  Eyes:     Extraocular Movements: Extraocular movements intact.     Conjunctiva/sclera: Conjunctivae normal.     Pupils: Pupils are equal, round, and reactive to light.  Cardiovascular:     Rate and Rhythm: Normal rate and regular rhythm.     Pulses: Normal pulses.     Heart sounds: Normal heart sounds.  Pulmonary:     Effort: Pulmonary effort is normal.     Breath sounds: Normal breath sounds. No wheezing, rhonchi or rales.  Musculoskeletal:        General: Normal range of motion.     Right lower leg: No edema.     Left lower leg: No edema.  Skin:    General: Skin is warm and dry.     Findings: No lesion or rash.  Neurological:     General: No focal deficit present.  Mental Status: She is alert and oriented to person, place, and time.  Psychiatric:        Mood and Affect: Mood normal.        Behavior: Behavior normal.      Assessment and Plan   1. Depression with anxiety  2. Primary hypertension  3. Osteoporosis without current pathological fracture, unspecified osteoporosis type   Osteoporosis- Pt doing well on fosamax will cont.  Needs repeat dexa scan in 2023.   htn- slight elevation.  Dec salt and cont to work on diet and exercising.  Cont meds.  Depresion/anxiety- stable, cont meds.  On next visit pt needs cbc, cmp, lipids.  F/u 51mo.

## 2020-06-05 ENCOUNTER — Ambulatory Visit: Payer: Medicare HMO | Admitting: Family Medicine

## 2020-06-28 ENCOUNTER — Other Ambulatory Visit: Payer: Self-pay | Admitting: Family Medicine

## 2020-06-30 MED ORDER — LISINOPRIL 20 MG PO TABS: ORAL_TABLET | ORAL | 0 refills | Status: DC

## 2020-06-30 NOTE — Addendum Note (Signed)
Addended by: Sharayah Renfrow, Barack Nicodemus W on: 06/30/2020 02:19 PM   Modules accepted: Orders  

## 2020-07-02 ENCOUNTER — Other Ambulatory Visit: Payer: Self-pay | Admitting: Family Medicine

## 2020-07-03 ENCOUNTER — Other Ambulatory Visit: Payer: Self-pay

## 2020-07-03 ENCOUNTER — Ambulatory Visit (INDEPENDENT_AMBULATORY_CARE_PROVIDER_SITE_OTHER): Payer: Self-pay | Admitting: *Deleted

## 2020-07-03 VITALS — Ht 65.5 in | Wt 174.4 lb

## 2020-07-03 DIAGNOSIS — Z86010 Personal history of colonic polyps: Secondary | ICD-10-CM

## 2020-07-03 MED ORDER — PEG 3350-KCL-NA BICARB-NACL 420 G PO SOLR: 4000.0000 mL | Freq: Once | ORAL | 0 refills | Status: DC

## 2020-07-03 NOTE — Progress Notes (Signed)
Ok to schedule.  ASA II 

## 2020-07-03 NOTE — Patient Instructions (Addendum)
Michelle Landry   September 02, 1955 MRN: [id number] Procedure Date: 07/14/2020 Arrival Time: 7:30 am Procedure Time: 9:00 am    Location of Procedure: [facility]  PREPARATION FOR COLONOSCOPY WITH TRI-LYTE PREP  Please notify us immediately if you are diabetic, take iron supplements, or if you are on Coumadin or any other blood thinners.   Please hold the following medications: n/a   PROCEDURE IS SCHEDULED FOR Michelle Landry AS FOLLOWS:  Procedure Date: 07/14/2020 Time to register: 7:30 am Place to register: [facility] Scheduled provider: Dr. Abbey Chatters   2 DAYS BEFORE PROCEDURE:  DATE: 07/12/2020   DAY: Saturday Begin clear liquid diet AFTER your lunch meal. NO SOLID FOODS!   1 DAY BEFORE PROCEDURE:  DATE: 07/13/2020   DAY: Sunday  Continue clear liquids the entire day - NO SOLID FOOD.   Diabetic medications adjustments for today: n/a  At 12:00pm (noon): Take 2 (two) Dulcolax (Bisacodyl) tablets  At 2:00pm: Start drinking your solution. Try to drink 1 (one) 8 ounce glass every 10-15 minutes, until you have consumed HALF the jug. (You should complete the first 1/2 of the jug in 2 hours. Wait 30 minutes, then drink 3-4 more glasses of the solution. Your stools should be clear; if not, you may have to consume the rest of the jug.   One hour after completing the solution: take the last 2 (two) Dulcolax (Bisacodyl) tablets, with a clear liquid.  YOU MUST DRINK PLENTY OF CLEAR LIQUIDS DURING YOUR PREP TO REDUCE RISKS OF KIDNEY FAILURE.   Continue clear liquids only, until midnight. Do not eat or drink anything after midnight.  EXCEPTION:  If you take medications for your heart, blood pressure or breathing, you may take these medications with a small amount of clear liquid.      DAY OF PROCEDURE:   DATE: 07/14/2020      DAY: Monday The morning of your procedure give yourself 1 (one) Fleet Enema, at least 1 hour before going to the hospital.   You may take Tylenol products. Please continue  your regular medications unless we have instructed otherwise.   Diabetic medications adjustments for today. n/a  Someone MUST be available to drive you home; the hospital will cancel this appointment if you do not have a driver.   Please call the office if you have any questions (Dept: [phone]).  Please see below for Dietary Information.  CLEAR LIQUIDS INCLUDE:  Water Jello (NOT red in color)   Ice Popsicles (NOT red in color)   Tea (sugar ok, no milk/cream) Powdered fruit flavored drinks  Coffee (sugar ok, no milk/cream) Gatorade/ Lemonade/ Kool-Aid  (NOT red in color)   Juice: apple, white grape, white cranberry Soft drinks  Clear bullion, consomme, broth (fat free beef/chicken/vegetable)  Carbonated beverages (any kind)  Strained chicken noodle soup Hard Candy   REMEMBER: Clear liquids are liquids that will allow you to see your fingers on the other side of a clear glass. Be sure liquids are NOT red in color, and not cloudy, but CLEAR.   DO NOT EAT OR DRINK ANY OF THE FOLLOWING:  Dairy products of any kind   Cranberry juice Tomato juice / V8 juice   Grapefruit juice Orange juice     Red grape juice  Do not eat any solid foods, including such foods as: cereal, oatmeal, yogurt, fruits, vegetables, creamed soups, eggs, bread, etc.    HELPFUL HINTS FOR DRINKING PREP SOLUTION:   Make sure prep is extremely cold. Refrigerate the  night before. You may also put in the freezer.   You may try mixing some Crystal Light or Country Time Lemonade if you prefer. Mix in small amounts; add more if necessary.  Try drinking through a straw  Rinse mouth with water or a mouthwash between glasses, to remove after-taste.  Try sipping on a cold beverage /ice/ popsicles between glasses of prep  Place a piece of sugar-free hard candy in mouth between glasses  If you become nauseated, try consuming smaller amounts, or stretch out the time between glasses. Stop for 30-60 minutes, then  slowly start back drinking    You may call the office (Dept: [phone]) before 5:00pm, or page the doctor on call after 5:00pm [phone]), for further instructions, if necessary.   OTHER INSTRUCTIONS  You will need a responsible adult at least 66 years of age to accompany you and drive you home. This person must remain in the waiting room during your procedure.  Wear loose fitting clothing that is easily removed.  Leave jewelry and other valuables at home.   Remove all body piercing jewelry and leave at home.  Total time from sign-in until discharge is approximately 2-3 hours.  You should go home directly after your procedure and rest. You can resume normal activities the day after your procedure.  The day of your procedure you should not:  Drive  Make legal decisions  Operate machinery  Drink alcohol  Return to work

## 2020-07-03 NOTE — Progress Notes (Signed)
Gastroenterology Pre-Procedure Review  Request Date: 07/03/2020 Requesting Physician: 5 year recall, Last TCS done 06/04/2015 by Dr. Oneida Alar, tubular adenoma  PATIENT REVIEW QUESTIONS: The patient responded to the following health history questions as indicated:    1. Diabetes Melitis: no 2. Joint replacements in the past 12 months: no 3. Major health problems in the past 3 months: no 4. Has an artificial valve or MVP: no 5. Has a defibrillator: no 6. Has been advised in past to take antibiotics in advance of a procedure like teeth cleaning: yes, but not anymore 7. Family history of colon cancer: no  8. Alcohol Use: no 9. Illicit drug Use: no 10. History of sleep apnea: no  11. History of coronary artery or other vascular stents placed within the last 12 months: no 12. History of any prior anesthesia complications: no 13. Body mass index is 28.58 kg/m.    MEDICATIONS & ALLERGIES:    Patient reports the following regarding taking any blood thinners:   Plavix? no Aspirin? no Coumadin? no Brilinta? no Xarelto? no Eliquis? no Pradaxa? no Savaysa? no Effient? no  Patient confirms/reports the following medications:  Current Outpatient Medications  Medication Sig Dispense Refill  . alendronate (FOSAMAX) 70 MG tablet TAKE 1 TABLET BY MOUTH ONCE A WEEK. TAKE WITH A FULL GLASS OF WATER ON AN EMPTY STOMACH. 12 tablet 1  . cholecalciferol (VITAMIN D3) 25 MCG (1000 UNIT) tablet Take 1,000 Units by mouth daily.    . citalopram (CELEXA) 20 MG tablet Take 1 tablet (20 mg total) by mouth at bedtime. 90 tablet 1  . indapamide (LOZOL) 1.25 MG tablet TAKE 1 TABLET BY MOUTH IN THE MORNING FOR BLOOD PRESSURE AND FOR FLUID 90 tablet 1  . lisinopril (ZESTRIL) 20 MG tablet TAKE 1 TABLET BY MOUTH ONCE DAILY IN THE MORNING 30 tablet 4  . Multiple Vitamins-Minerals (MULTIVITAMIN WOMEN PO) Take by mouth daily.    . potassium chloride (KLOR-CON) 10 MEQ tablet Take 1 tablet (10 mEq total) by mouth daily.  90 tablet 1  . vitamin C (ASCORBIC ACID) 500 MG tablet Take 500 mg by mouth daily.     No current facility-administered medications for this visit.    Patient confirms/reports the following allergies:  Allergies  Allergen Reactions  . Sulfa Antibiotics     Upset stomach - no allergic reaction    No orders of the defined types were placed in this encounter.   AUTHORIZATION INFORMATION Primary Insurance: [facility] [employer],  ID [id number] ,  Group #: [id number] [place] Pre-Cert / Auth required: No, not required  SCHEDULE INFORMATION: Procedure has been scheduled as follows:  Date: 07/14/2020, Time: 9:00 Location: [facility] with Dr. Abbey Chatters  This Gastroenterology Pre-Precedure Review Form is being routed to the following provider(s): Neil Crouch, PA

## 2020-07-10 ENCOUNTER — Telehealth: Payer: Self-pay | Admitting: *Deleted

## 2020-07-10 NOTE — Telephone Encounter (Signed)
Called pt and made aware procedure for 2/7 needs to be rescheduled d/t hospital cancelling procedure for that day. She has been rescheduled ot 2/28 at 9:30am. Aware will mail new prep instructions with new covid test appt.

## 2020-07-11 ENCOUNTER — Other Ambulatory Visit (HOSPITAL_COMMUNITY)
Admission: RE | Admit: 2020-07-11 | Discharge: 2020-07-11 | Disposition: A | Discharge status: Home / Self Care | Payer: Medicare HMO | Source: Ambulatory Visit | Attending: Internal Medicine | Admitting: Internal Medicine

## 2020-07-17 ENCOUNTER — Other Ambulatory Visit: Payer: Self-pay | Admitting: Family Medicine

## 2020-07-31 ENCOUNTER — Encounter (HOSPITAL_COMMUNITY): Payer: Self-pay

## 2020-08-01 ENCOUNTER — Other Ambulatory Visit: Payer: Self-pay

## 2020-08-01 ENCOUNTER — Other Ambulatory Visit (HOSPITAL_COMMUNITY)
Admission: RE | Admit: 2020-08-01 | Discharge: 2020-08-01 | Disposition: A | Discharge status: Home / Self Care | Payer: Medicare HMO | Source: Ambulatory Visit | Attending: Internal Medicine | Admitting: Internal Medicine

## 2020-08-01 DIAGNOSIS — Z20822 Contact with and (suspected) exposure to covid-19: Secondary | ICD-10-CM | POA: Diagnosis not present

## 2020-08-01 DIAGNOSIS — Z01812 Encounter for preprocedural laboratory examination: Secondary | ICD-10-CM | POA: Diagnosis not present

## 2020-08-01 LAB — SARS CORONAVIRUS 2 (TAT 6-24 HRS): SARS Coronavirus 2: NEGATIVE

## 2020-08-04 ENCOUNTER — Encounter (HOSPITAL_COMMUNITY): Payer: Self-pay | Admitting: *Deleted

## 2020-08-04 ENCOUNTER — Ambulatory Visit (HOSPITAL_COMMUNITY)
Admission: RE | Admit: 2020-08-04 | Discharge: 2020-08-04 | Disposition: A | Discharge status: Home / Self Care | Payer: Medicare HMO | Source: Ambulatory Visit | Attending: Internal Medicine | Admitting: Internal Medicine

## 2020-08-04 ENCOUNTER — Ambulatory Visit (HOSPITAL_COMMUNITY): Payer: Medicare HMO | Admitting: Anesthesiology

## 2020-08-04 ENCOUNTER — Other Ambulatory Visit: Payer: Self-pay

## 2020-08-04 ENCOUNTER — Encounter (HOSPITAL_COMMUNITY)
Admission: RE | Disposition: A | Discharge status: Home / Self Care | Payer: Self-pay | Source: Ambulatory Visit | Attending: Internal Medicine

## 2020-08-04 DIAGNOSIS — Z1211 Encounter for screening for malignant neoplasm of colon: Secondary | ICD-10-CM | POA: Insufficient documentation

## 2020-08-04 DIAGNOSIS — D120 Benign neoplasm of cecum: Secondary | ICD-10-CM | POA: Diagnosis not present

## 2020-08-04 DIAGNOSIS — K635 Polyp of colon: Secondary | ICD-10-CM | POA: Insufficient documentation

## 2020-08-04 DIAGNOSIS — Z96643 Presence of artificial hip joint, bilateral: Secondary | ICD-10-CM | POA: Insufficient documentation

## 2020-08-04 DIAGNOSIS — Z86010 Personal history of colonic polyps: Secondary | ICD-10-CM | POA: Diagnosis not present

## 2020-08-04 DIAGNOSIS — K648 Other hemorrhoids: Secondary | ICD-10-CM | POA: Insufficient documentation

## 2020-08-04 DIAGNOSIS — Z79899 Other long term (current) drug therapy: Secondary | ICD-10-CM | POA: Insufficient documentation

## 2020-08-04 DIAGNOSIS — R69 Illness, unspecified: Secondary | ICD-10-CM | POA: Diagnosis not present

## 2020-08-04 DIAGNOSIS — Z7983 Long term (current) use of bisphosphonates: Secondary | ICD-10-CM | POA: Diagnosis not present

## 2020-08-04 HISTORY — PX: COLONOSCOPY WITH PROPOFOL: SHX5780

## 2020-08-04 HISTORY — PX: POLYPECTOMY: SHX149

## 2020-08-04 SURGERY — COLONOSCOPY WITH PROPOFOL
Anesthesia: General

## 2020-08-04 MED ORDER — KETAMINE HCL 50 MG/5ML IJ SOSY
PREFILLED_SYRINGE | INTRAMUSCULAR | Status: AC
  Filled 2020-08-04: qty 5

## 2020-08-04 MED ORDER — PROPOFOL 10 MG/ML IV BOLUS
INTRAVENOUS | Status: DC | PRN
  Administered 2020-08-04: 20 mg via INTRAVENOUS

## 2020-08-04 MED ORDER — STERILE WATER FOR IRRIGATION IR SOLN
Status: DC | PRN
  Administered 2020-08-04: 200 mL

## 2020-08-04 MED ORDER — PROPOFOL 500 MG/50ML IV EMUL
INTRAVENOUS | Status: DC | PRN
  Administered 2020-08-04: 150 ug/kg/min via INTRAVENOUS

## 2020-08-04 MED ORDER — LACTATED RINGERS IV SOLN: INTRAVENOUS | Status: DC

## 2020-08-04 MED ORDER — EPHEDRINE SULFATE-NACL 50-0.9 MG/10ML-% IV SOSY
PREFILLED_SYRINGE | INTRAVENOUS | Status: DC | PRN
  Administered 2020-08-04 (×2): 5 mg via INTRAVENOUS

## 2020-08-04 MED ORDER — LIDOCAINE HCL (CARDIAC) PF 100 MG/5ML IV SOSY
PREFILLED_SYRINGE | INTRAVENOUS | Status: DC | PRN
  Administered 2020-08-04: 50 mg via INTRATRACHEAL

## 2020-08-04 MED ORDER — GLYCOPYRROLATE PF 0.2 MG/ML IJ SOSY
PREFILLED_SYRINGE | INTRAMUSCULAR | Status: AC
  Filled 2020-08-04: qty 1

## 2020-08-04 MED ORDER — KETAMINE HCL 10 MG/ML IJ SOLN
INTRAMUSCULAR | Status: DC | PRN
  Administered 2020-08-04: 15 mg via INTRAVENOUS

## 2020-08-04 MED ORDER — EPHEDRINE 5 MG/ML INJ
INTRAVENOUS | Status: AC
  Filled 2020-08-04: qty 10

## 2020-08-04 MED ORDER — SIMETHICONE 40 MG/0.6ML PO SUSP
ORAL | Status: AC
  Filled 2020-08-04: qty 0.6

## 2020-08-04 NOTE — Anesthesia Postprocedure Evaluation (Signed)
Anesthesia Post Note  Patient: Lerline Valdivia  Procedure(s) Performed: COLONOSCOPY WITH PROPOFOL (N/A ) POLYPECTOMY INTESTINAL  Patient location during evaluation: PACU Anesthesia Type: General Level of consciousness: awake Pain management: pain level controlled Vital Signs Assessment: post-procedure vital signs reviewed and stable Respiratory status: spontaneous breathing, nonlabored ventilation and respiratory function stable Cardiovascular status: stable Postop Assessment: no apparent nausea or vomiting Anesthetic complications: no   No complications documented.   Last Vitals:  Vitals:   08/04/20 0842 08/04/20 1038  BP: (!) 147/74 (!) 110/47  Pulse: 63 68  Resp: 16   Temp: 36.6 C 36.6 C  SpO2: 99% 99%    Last Pain:  Vitals:   08/04/20 1038  TempSrc:   PainSc: 0-No pain                 Ruperto Kiernan Hristova

## 2020-08-04 NOTE — Discharge Instructions (Signed)
  Colonoscopy Discharge Instructions  Read the instructions outlined below and refer to this sheet in the next few weeks. These discharge instructions provide you with general information on caring for yourself after you leave the hospital. Your doctor may also give you specific instructions. While your treatment has been planned according to the most current medical practices available, unavoidable complications occasionally occur.   ACTIVITY You may resume your regular activity, but move at a slower pace for the next 24 hours.  Take frequent rest periods for the next 24 hours.  Walking will help get rid of the air and reduce the bloated feeling in your belly (abdomen).  No driving for 24 hours (because of the medicine (anesthesia) used during the test).   Do not sign any important legal documents or operate any machinery for 24 hours (because of the anesthesia used during the test).  NUTRITION Drink plenty of fluids.  You may resume your normal diet as instructed by your doctor.  Begin with a light meal and progress to your normal diet. Heavy or fried foods are harder to digest and may make you feel sick to your stomach (nauseated).  Avoid alcoholic beverages for 24 hours or as instructed.  MEDICATIONS You may resume your normal medications unless your doctor tells you otherwise.  WHAT YOU CAN EXPECT TODAY Some feelings of bloating in the abdomen.  Passage of more gas than usual.  Spotting of blood in your stool or on the toilet paper.  IF YOU HAD POLYPS REMOVED DURING THE COLONOSCOPY: No aspirin products for 7 days or as instructed.  No alcohol for 7 days or as instructed.  Eat a soft diet for the next 24 hours.  FINDING OUT THE RESULTS OF YOUR TEST Not all test results are available during your visit. If your test results are not back during the visit, make an appointment with your caregiver to find out the results. Do not assume everything is normal if you have not heard from your  caregiver or the medical facility. It is important for you to follow up on all of your test results.  SEEK IMMEDIATE MEDICAL ATTENTION IF: You have more than a spotting of blood in your stool.  Your belly is swollen (abdominal distention).  You are nauseated or vomiting.  You have a temperature over 101.  You have abdominal pain or discomfort that is severe or gets worse throughout the day.   Your colonoscopy revealed 2 polyp(s) which I removed successfully. Await pathology results, my office will contact you. I recommend repeating colonoscopy in 5 years for surveillance purposes. Otherwise follow up with GI as needed.    I hope you have a great rest of your week!  Abbygayle Helfand K. Saryah Loper, D.O. Gastroenterology and Hepatology [facility]  

## 2020-08-04 NOTE — Op Note (Signed)
[facility] Patient Name: Michelle Landry Procedure Date: 08/04/2020 9:46 AM MRN: [id number] Date of Birth: 28-Nov-1955 Attending MD: Elon Alas. Abbey Chatters DO CSN: [id number] Age: 65 Admit Type: Outpatient Procedure:                Colonoscopy Indications:              High risk colon cancer surveillance: Personal                            history of colonic polyps Providers:                Elon Alas. Abbey Chatters, DO, [place], [place]                            Risa Grill, Technician Referring MD:              Medicines:                See the Anesthesia note for documentation of the                            administered medications Complications:            No immediate complications. Estimated Blood Loss:     Estimated blood loss was minimal. Procedure:                Pre-Anesthesia Assessment:                           - The anesthesia plan was to use monitored                            anesthesia care (MAC).                           After obtaining informed consent, the colonoscope                            was passed under direct vision. Throughout the                            procedure, the patient's blood pressure, pulse, and                            oxygen saturations were monitored continuously. The                            [id number]-[id number] ([id number]) scope was introduced through                            the anus and advanced to the the cecum, identified                            by appendiceal orifice and ileocecal valve. The                            colonoscopy was performed without difficulty. The  patient tolerated the procedure well. The quality                            of the bowel preparation was evaluated using the                            BBPS [facility] Bowel Preparation Scale) with scores                            of: Right Colon = 2 (minor amount of residual                            staining, small fragments of stool  and/or opaque                            liquid, but mucosa seen well), Transverse Colon = 3                            (entire mucosa seen well with no residual staining,                            small fragments of stool or opaque liquid) and Left                            Colon = 3 (entire mucosa seen well with no residual                            staining, small fragments of stool or opaque                            liquid). The total BBPS score equals 8. The quality                            of the bowel preparation was good. Scope In: 10:12:09 AM Scope Out: 10:29:12 AM Scope Withdrawal Time: 0 hours 12 minutes 29 seconds  Total Procedure Duration: 0 hours 17 minutes 3 seconds  Findings:      The perianal and digital rectal examinations were normal.      Non-bleeding internal hemorrhoids were found during endoscopy.      A 2 mm polyp was found in the cecum. The polyp was sessile. The polyp       was removed with a cold biopsy forceps. Resection and retrieval were       complete.      A 4 mm polyp was found in the sigmoid colon. The polyp was sessile. The       polyp was removed with a cold snare. Resection and retrieval were       complete. Impression:               - Non-bleeding internal hemorrhoids.                           - One 2 mm polyp in the cecum, removed with a cold  biopsy forceps. Resected and retrieved.                           - One 4 mm polyp in the sigmoid colon, removed with                            a cold snare. Resected and retrieved. Moderate Sedation:      Per Anesthesia Care Recommendation:           - Patient has a contact number available for                            emergencies. The signs and symptoms of potential                            delayed complications were discussed with the                            patient. Return to normal activities tomorrow.                            Written discharge instructions  were provided to the                            patient.                           - Resume previous diet.                           - Continue present medications.                           - Await pathology results.                           - Repeat colonoscopy in 5 years for surveillance.                           - Return to GI clinic PRN. Procedure Code(s):        --- Professional ---                           [phone], Colonoscopy, flexible; with removal of                            tumor(s), polyp(s), or other lesion(s) by snare                            technique                           45380, 59, Colonoscopy, flexible; with biopsy,                            single or multiple Diagnosis Code(s):        ---  Professional ---                           Z86.010, Personal history of colonic polyps                           K63.5, Polyp of colon                           K64.8, Other hemorrhoids CPT copyright 2019 American Medical Association. All rights reserved. The codes documented in this report are preliminary and upon coder review may  be revised to meet current compliance requirements. Elon Alas. Abbey Chatters, DO [place] Abbey Chatters, DO 08/04/2020 10:35:44 AM This report has been signed electronically. Number of Addenda: 0

## 2020-08-04 NOTE — Transfer of Care (Signed)
Immediate Anesthesia Transfer of Care Note  Patient: Michelle Landry  Procedure(s) Performed: COLONOSCOPY WITH PROPOFOL (N/A ) POLYPECTOMY INTESTINAL  Patient Location: PACU  Anesthesia Type:General  Level of Consciousness: awake  Airway & Oxygen Therapy: Patient Spontanous Breathing  Post-op Assessment: Report given to RN and Post -op Vital signs reviewed and stable  Post vital signs: Reviewed and stable  Last Vitals:  Vitals Value Taken Time  BP    Temp    Pulse    Resp    SpO2      Last Pain:  Vitals:   08/04/20 1007  TempSrc:   PainSc: 0-No pain      Patients Stated Pain Goal: 6 (16/10/96 0454)  Complications: No complications documented.

## 2020-08-04 NOTE — H&P (Addendum)
Primary Care Physician:  Erven Colla, DO Primary Gastroenterologist:  Dr. Abbey Chatters  Pre-Procedure History & Physical: HPI:  Michelle Landry is a 65 y.o. female is here for a colonoscopy to be performed for surveillance purposes due to personal history of adenomatous colon polyps. Last TCS 5 years ago, 2 tubular adenomas removed.   Patient denies any family history of colorectal cancer.  No melena or hematochezia.  No abdominal pain or unintentional weight loss.  No change in bowel habits.  Overall feels well from a GI standpoint.  Past Medical History:  Diagnosis Date  . Allergic rhinitis   . History of bronchitis   . Hypertension   . Osteoporosis 2012    Past Surgical History:  Procedure Laterality Date  . COLONOSCOPY  2006 MJ   ?POLYPS  . COLONOSCOPY N/A 06/04/2015   Procedure: COLONOSCOPY;  Surgeon: Danie Binder, MD;  Location: [facility];  Service: Endoscopy;  Laterality: N/A;  9:15  . 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 Right Knee Replacement  12/2010    Prior to Admission medications   Medication Sig Start Date End Date Taking? Authorizing Provider  alendronate (FOSAMAX) 70 MG tablet TAKE 1 TABLET BY MOUTH ONCE A WEEK. TAKE WITH A FULL GLASS OF WATER ON AN EMPTY STOMACH. Patient taking differently: Take 70 mg by mouth every Sunday. TAKE 1 TABLET BY MOUTH ONCE A WEEK. TAKE WITH A FULL GLASS OF WATER ON AN EMPTY STOMACH. 06/03/20  Yes Lovena Le, Malena M, DO  cholecalciferol (VITAMIN D) 25 MCG (1000 UNIT) tablet Take 1,000 Units by mouth daily.   Yes [provider]  citalopram (CELEXA) 20 MG tablet Take 1 tablet (20 mg total) by mouth at bedtime. 06/03/20  Yes Taylor, Malena M, DO  indapamide (LOZOL) 1.25 MG tablet TAKE 1 TABLET BY MOUTH IN THE MORNING FOR BLOOD PRESSURE AND FOR FLUID Patient taking differently: Take 1.25 mg by mouth daily. 06/03/20  Yes Taylor, Malena M, DO  lisinopril (ZESTRIL) 20 MG tablet TAKE 1 TABLET BY MOUTH ONCE DAILY IN THE MORNING 07/18/20  Yes  Lovena Le, Malena M, DO  Multiple Vitamin (MULTIVITAMIN WITH MINERALS) TABS tablet Take 1 tablet by mouth daily.   Yes [provider]  polyethylene glycol-electrolytes (NULYTELY) 420 g solution Take 4,000 mLs by mouth once. 07/03/20  Yes [provider]  potassium chloride (KLOR-CON) 10 MEQ tablet Take 1 tablet (10 mEq total) by mouth daily. 06/03/20  Yes Lovena Le, Malena M, DO  vitamin C (ASCORBIC ACID) 500 MG tablet Take 500 mg by mouth daily.   Yes [provider]  ibuprofen (ADVIL) 200 MG tablet Take 400 mg by mouth every 8 (eight) hours as needed (arthritis pain.).    [provider]    Allergies as of 07/03/2020 - Review Complete 07/03/2020  Allergen Reaction Noted  . Sulfa antibiotics  11/30/2012    Family History  Problem Relation Age of Onset  . Hypertension Sister   . Diabetes Sister   . Heart disease Sister   . Hypertension Mother   . Osteoporosis Mother   . Heart disease Father 31       MI  . Cancer Sister        breast    Social History   Socioeconomic History  . Marital status: Single    Spouse name: Not on file  . Number of children: Not on file  . Years of education: Not on file  . Highest education level: Not on file  Occupational  History  . Not on file  Tobacco Use  . Smoking status: Never Smoker  . Smokeless tobacco: Never Used  Vaping Use  . Vaping Use: Never used  Substance and Sexual Activity  . Alcohol use: No    Alcohol/week: 0.0 standard drinks  . Drug use: No  . Sexual activity: Never    Birth control/protection: Abstinence  Other Topics Concern  . Not on file  Social History Narrative  . Not on file   Social Determinants of Health   Financial Resource Strain: Not on file  Food Insecurity: Not on file  Transportation Needs: Not on file  Physical Activity: Not on file  Stress: Not on file  Social Connections: Not on file  Intimate Partner Violence: Not on file    Review of Systems: See HPI,  otherwise negative ROS  Physical Exam: Vital signs in last 24 hours: Temp:  [97.8 F (36.6 C)] 97.8 F (36.6 C) (02/28 0842) Pulse Rate:  [63] 63 (02/28 0842) Resp:  [16] 16 (02/28 0842) BP: (147)/(74) 147/74 (02/28 0842) SpO2:  [99 %] 99 % (02/28 0842) Weight:  [83.5 kg] 83.5 kg (02/28 0842)   General:   Alert,  Well-developed, well-nourished, pleasant and cooperative in NAD Head:  Normocephalic and atraumatic. Eyes:  Sclera clear, no icterus.   Conjunctiva pink. Ears:  Normal auditory acuity. Nose:  No deformity, discharge,  or lesions. Mouth:  No deformity or lesions, dentition normal. Neck:  Supple; no masses or thyromegaly. Lungs:  Clear throughout to auscultation.   No wheezes, crackles, or rhonchi. No acute distress. Heart:  Regular rate and rhythm; no murmurs, clicks, rubs,  or gallops. Abdomen:  Soft, nontender and nondistended. No masses, hepatosplenomegaly or hernias noted. Normal bowel sounds, without guarding, and without rebound.   Msk:  Symmetrical without gross deformities. Normal posture. Pulses:  Normal pulses noted. Extremities:  Without clubbing or edema. Neurologic:  Alert and  oriented x4;  grossly normal neurologically. Skin:  Intact without significant lesions or rashes. Cervical Nodes:  No significant cervical adenopathy. Psych:  Alert and cooperative. Normal mood and affect.  Impression/Plan: Michelle Landry is here for a colonoscopy to be performed for surveillance purposes due to personal history of adenomatous colon polyps.  The risks of the procedure including infection, bleed, or perforation as well as benefits, limitations, alternatives and imponderables have been reviewed with the patient. Questions have been answered. All parties agreeable.

## 2020-08-04 NOTE — Anesthesia Preprocedure Evaluation (Addendum)
Anesthesia Evaluation  Patient identified by MRN, date of birth, ID band Patient awake    Reviewed: Allergy & Precautions, NPO status , Patient's Chart, lab work & pertinent test results  History of Anesthesia Complications Negative for: history of anesthetic complications  Airway Mallampati: II  TM Distance: >3 FB Neck ROM: Full    Dental  (+) Dental Advisory Given Crowns :   Pulmonary neg pulmonary ROS,    Pulmonary exam normal breath sounds clear to auscultation       Cardiovascular Exercise Tolerance: Good hypertension, Pt. on medications Normal cardiovascular exam Rhythm:Regular Rate:Normal     Neuro/Psych PSYCHIATRIC DISORDERS Anxiety Depression  Neuromuscular disease (left hip pain and weakness, right knee pain and quadriceps weakness)    GI/Hepatic negative GI ROS, Neg liver ROS,   Endo/Other  negative endocrine ROS  Renal/GU negative Renal ROS     Musculoskeletal  (+) Arthritis , Osteoarthritis,    Abdominal   Peds  Hematology negative hematology ROS (+)   Anesthesia Other Findings   Reproductive/Obstetrics                            Anesthesia Physical Anesthesia Plan  ASA: II  Anesthesia Plan: General   Post-op Pain Management:    Induction: Intravenous  PONV Risk Score and Plan: TIVA  Airway Management Planned: Nasal Cannula and Natural Airway  Additional Equipment:   Intra-op Plan:   Post-operative Plan:   Informed Consent: I have reviewed the patients History and Physical, chart, labs and discussed the procedure including the risks, benefits and alternatives for the proposed anesthesia with the patient or authorized representative who has indicated his/her understanding and acceptance.     Dental advisory given  Plan Discussed with: CRNA and Surgeon  Anesthesia Plan Comments:         Anesthesia Quick Evaluation

## 2020-08-05 LAB — SURGICAL PATHOLOGY

## 2020-08-07 ENCOUNTER — Other Ambulatory Visit: Payer: Self-pay | Admitting: Family Medicine

## 2020-08-07 ENCOUNTER — Encounter (HOSPITAL_COMMUNITY): Payer: Self-pay | Admitting: Internal Medicine

## 2020-12-02 ENCOUNTER — Encounter: Payer: Self-pay | Admitting: Family Medicine

## 2020-12-02 ENCOUNTER — Other Ambulatory Visit: Payer: Self-pay

## 2020-12-02 ENCOUNTER — Ambulatory Visit (INDEPENDENT_AMBULATORY_CARE_PROVIDER_SITE_OTHER): Payer: Medicare HMO | Admitting: Family Medicine

## 2020-12-02 VITALS — BP 118/72 | HR 70 | Temp 97.2°F | Ht 65.5 in | Wt 184.0 lb

## 2020-12-02 DIAGNOSIS — R5383 Other fatigue: Secondary | ICD-10-CM | POA: Diagnosis not present

## 2020-12-02 DIAGNOSIS — R42 Dizziness and giddiness: Secondary | ICD-10-CM | POA: Diagnosis not present

## 2020-12-02 DIAGNOSIS — E669 Obesity, unspecified: Secondary | ICD-10-CM | POA: Diagnosis not present

## 2020-12-02 DIAGNOSIS — I10 Essential (primary) hypertension: Secondary | ICD-10-CM

## 2020-12-02 DIAGNOSIS — R69 Illness, unspecified: Secondary | ICD-10-CM | POA: Diagnosis not present

## 2020-12-02 DIAGNOSIS — E66811 Obesity, class 1: Secondary | ICD-10-CM

## 2020-12-02 DIAGNOSIS — E559 Vitamin D deficiency, unspecified: Secondary | ICD-10-CM | POA: Diagnosis not present

## 2020-12-02 DIAGNOSIS — F418 Other specified anxiety disorders: Secondary | ICD-10-CM

## 2020-12-02 MED ORDER — CITALOPRAM HYDROBROMIDE 20 MG PO TABS: 20.0000 mg | ORAL_TABLET | Freq: Every day | ORAL | 1 refills | Status: DC

## 2020-12-02 MED ORDER — INDAPAMIDE 1.25 MG PO TABS: ORAL_TABLET | ORAL | 1 refills | Status: DC

## 2020-12-02 MED ORDER — POTASSIUM CHLORIDE ER 10 MEQ PO TBCR
10.0000 meq | EXTENDED_RELEASE_TABLET | Freq: Every day | ORAL | 1 refills | Status: DC
Start: 2020-12-02 — End: 2021-05-07

## 2020-12-02 MED ORDER — MECLIZINE HCL 25 MG PO TABS: 25.0000 mg | ORAL_TABLET | Freq: Two times a day (BID) | ORAL | 2 refills | Status: DC | PRN

## 2020-12-02 MED ORDER — LISINOPRIL 20 MG PO TABS: 20.0000 mg | ORAL_TABLET | Freq: Every morning | ORAL | 1 refills | Status: DC

## 2020-12-02 NOTE — Progress Notes (Signed)
 Patient ID: Michelle Michelle Landry, female    DOB: 11/03/1955, 65 y.o.   MRN: [id number]   Chief Complaint  Patient presents with   Anxiety   Subjective:    HPI F/u anxiety-   Med check up on citalopram. Takes for anxiety. Concerns about being really tired for the past couple of weeks.   Feeling more tired.  Feeling wanting more sleep.   Going to bed around 11pm, getting up around 7am. Has some snoring. Not having any apnea that she knows of.  Not falling asleep out or driving. Napping in afternoons.  Pt fasting today for labs.  Pt has had h/o vertigo since her 20s.  Today feeling a slight feeling if moving head quickly brings it on.   Pt stating she was told has low vit d in past and taking vit d for osteoporosis and fosamax.  Medical History Michelle Landry has a past medical history of Allergic rhinitis, History of bronchitis, Hypertension, and Osteoporosis (2012).   Outpatient Encounter Medications as of 12/02/2020  Medication Sig   alendronate (FOSAMAX) 70 MG tablet TAKE 1 TABLET BY MOUTH ONCE A WEEK. TAKE WITH A FULL GLASS OF WATER ON AN EMPTY STOMACH. (Patient taking differently: Take 70 mg by mouth every Sunday. TAKE 1 TABLET BY MOUTH ONCE A WEEK. TAKE WITH A FULL GLASS OF WATER ON AN EMPTY STOMACH.)   cholecalciferol (VITAMIN D) 25 MCG (1000 UNIT) tablet Take 1,000 Units by mouth daily.   ibuprofen (ADVIL) 200 MG tablet Take 400 mg by mouth every 8 (eight) hours as needed (arthritis pain.).   meclizine (ANTIVERT) 25 MG tablet Take 1 tablet (25 mg total) by mouth 2 (two) times daily as needed for dizziness.   Multiple Vitamin (MULTIVITAMIN WITH MINERALS) TABS tablet Take 1 tablet by mouth daily.   vitamin C (ASCORBIC ACID) 500 MG tablet Take 500 mg by mouth daily.   [DISCONTINUED] citalopram (CELEXA) 20 MG tablet Take 1 tablet (20 mg total) by mouth at bedtime.   [DISCONTINUED] indapamide (LOZOL) 1.25 MG tablet TAKE 1 TABLET BY MOUTH IN THE MORNING FOR BLOOD PRESSURE AND FOR FLUID  (Patient taking differently: Take 1.25 mg by mouth daily.)   [DISCONTINUED] lisinopril (ZESTRIL) 20 MG tablet TAKE 1 TABLET BY MOUTH ONCE DAILY IN THE MORNING   [DISCONTINUED] polyethylene glycol-electrolytes (NULYTELY) 420 g solution Take 4,000 mLs by mouth once.   [DISCONTINUED] potassium chloride (KLOR-CON) 10 MEQ tablet Take 1 tablet (10 mEq total) by mouth daily.   citalopram (CELEXA) 20 MG tablet Take 1 tablet (20 mg total) by mouth at bedtime.   indapamide (LOZOL) 1.25 MG tablet TAKE 1 TABLET BY MOUTH IN THE MORNING FOR BLOOD PRESSURE AND FOR FLUID   lisinopril (ZESTRIL) 20 MG tablet Take 1 tablet (20 mg total) by mouth every morning.   potassium chloride (KLOR-CON) 10 MEQ tablet Take 1 tablet (10 mEq total) by mouth daily.   No facility-administered encounter medications on file as of 12/02/2020.     Review of Systems  Constitutional:  Positive for fatigue. Negative for chills and fever.  HENT:  Negative for congestion, rhinorrhea and sore throat.   Respiratory:  Negative for cough, shortness of breath and wheezing.   Cardiovascular:  Negative for chest pain and leg swelling.  Gastrointestinal:  Negative for abdominal pain, diarrhea, nausea and vomiting.  Genitourinary:  Negative for dysuria and frequency.  Musculoskeletal:  Negative for arthralgias and Michelle Landry pain.  Skin:  Negative for rash.  Neurological:  Negative for dizziness, weakness and   headaches.    Vitals BP 118/72   Pulse 70   Temp (!) 97.2 F (36.2 C)   Ht 5' 5.5" (1.664 m)   Wt 184 lb (83.5 kg)   SpO2 98%   BMI 30.15 kg/m   Objective:   Physical Exam Vitals and nursing note reviewed.  Constitutional:      Appearance: Normal appearance.  HENT:     Head: Normocephalic and atraumatic.     Nose: Nose normal.     Mouth/Throat:     Mouth: Mucous membranes are moist.     Pharynx: Oropharynx is clear.  Eyes:     Extraocular Movements: Extraocular movements intact.     Conjunctiva/sclera: Conjunctivae  normal.     Pupils: Pupils are equal, round, and reactive to light.  Cardiovascular:     Rate and Rhythm: Normal rate and regular rhythm.     Pulses: Normal pulses.     Heart sounds: Normal heart sounds.  Pulmonary:     Effort: Pulmonary effort is normal.     Breath sounds: Normal breath sounds. No wheezing, rhonchi or rales.  Musculoskeletal:        General: Normal range of motion.     Right lower leg: No edema.     Left lower leg: No edema.  Skin:    General: Skin is warm and dry.     Findings: No lesion or rash.  Neurological:     General: No focal deficit present.     Mental Status: She is alert and oriented to person, place, and time.  Psychiatric:        Mood and Affect: Mood normal.        Behavior: Behavior normal.     Assessment and Plan   1. Fatigue, unspecified type - CBC - CMP14+EGFR - TSH - Vitamin D (25 hydroxy)  2. Obesity (BMI 30.0-34.9) - Lipid panel  3. Vitamin D deficiency - Vitamin D (25 hydroxy)  4. Vertigo - meclizine (ANTIVERT) 25 MG tablet; Take 1 tablet (25 mg total) by mouth 2 (two) times daily as needed for dizziness.  Dispense: 30 tablet; Refill: 2  5. Depression with anxiety - citalopram (CELEXA) 20 MG tablet; Take 1 tablet (20 mg total) by mouth at bedtime.  Dispense: 90 tablet; Refill: 1  6. Primary hypertension - indapamide (LOZOL) 1.25 MG tablet; TAKE 1 TABLET BY MOUTH IN THE MORNING FOR BLOOD PRESSURE AND FOR FLUID  Dispense: 90 tablet; Refill: 1 - lisinopril (ZESTRIL) 20 MG tablet; Take 1 tablet (20 mg total) by mouth every morning.  Dispense: 90 tablet; Refill: 1 - potassium chloride (KLOR-CON) 10 MEQ tablet; Take 1 tablet (10 mEq total) by mouth daily.  Dispense: 90 tablet; Refill: 1   Htn- stable.   Fatigue- labs reviewed, and normal Has some snoring may need sleep study to evaluate.  Pt wanting to wait for now.  Depression/anxiety- cont with celexa.   H/o vertigo-stable. Taking meclizine prn.  Return in about 6 months  (around 06/03/2021) for f/u anxiety, vertigo, vit d.  12/20/2020  

## 2020-12-03 LAB — CBC
Hematocrit: 41.3 % (ref 34.0–46.6)
Hemoglobin: 13.4 g/dL (ref 11.1–15.9)
MCH: 28.5 pg (ref 26.6–33.0)
MCHC: 32.4 g/dL (ref 31.5–35.7)
MCV: 88 fL (ref 79–97)
Platelets: 212 10*3/uL (ref 150–450)
RBC: 4.7 x10E6/uL (ref 3.77–5.28)
RDW: 13.2 % (ref 11.7–15.4)
WBC: 4.5 10*3/uL (ref 3.4–10.8)

## 2020-12-03 LAB — LIPID PANEL
Chol/HDL Ratio: 3.4 ratio (ref 0.0–4.4)
Cholesterol, Total: 179 mg/dL (ref 100–199)
HDL: 53 mg/dL (ref >39)
LDL Chol Calc (NIH): 109 mg/dL — ABNORMAL HIGH (ref 0–99)
Triglycerides: 93 mg/dL (ref 0–149)
VLDL Cholesterol Cal: 17 mg/dL (ref 5–40)

## 2020-12-03 LAB — CMP14+EGFR
ALT: 23 IU/L (ref 0–32)
AST: 21 IU/L (ref 0–40)
Albumin/Globulin Ratio: 2 (ref 1.2–2.2)
Albumin: 4.5 g/dL (ref 3.8–4.8)
Alkaline Phosphatase: 87 IU/L (ref 44–121)
BUN/Creatinine Ratio: 23 (ref 12–28)
BUN: 16 mg/dL (ref 8–27)
Bilirubin Total: 0.5 mg/dL (ref 0.0–1.2)
CO2: 25 mmol/L (ref 20–29)
Calcium: 9.6 mg/dL (ref 8.7–10.3)
Chloride: 103 mmol/L (ref 96–106)
Creatinine, Ser: 0.7 mg/dL (ref 0.57–1.00)
Globulin, Total: 2.2 g/dL (ref 1.5–4.5)
Glucose: 90 mg/dL (ref 65–99)
Potassium: 4.4 mmol/L (ref 3.5–5.2)
Sodium: 143 mmol/L (ref 134–144)
Total Protein: 6.7 g/dL (ref 6.0–8.5)
eGFR: 95 mL/min/{1.73_m2} (ref >59)

## 2020-12-03 LAB — VITAMIN D 25 HYDROXY (VIT D DEFICIENCY, FRACTURES): Vit D, 25-Hydroxy: 37.9 ng/mL (ref 30.0–100.0)

## 2020-12-03 LAB — TSH: TSH: 1.62 u[IU]/mL (ref 0.450–4.500)

## 2020-12-17 ENCOUNTER — Other Ambulatory Visit (HOSPITAL_COMMUNITY): Payer: Self-pay | Admitting: Family Medicine

## 2020-12-17 DIAGNOSIS — Z1231 Encounter for screening mammogram for malignant neoplasm of breast: Secondary | ICD-10-CM

## 2020-12-24 ENCOUNTER — Other Ambulatory Visit: Payer: Self-pay

## 2020-12-24 ENCOUNTER — Ambulatory Visit (HOSPITAL_COMMUNITY)
Admission: RE | Admit: 2020-12-24 | Discharge: 2020-12-24 | Disposition: A | Discharge status: Home / Self Care | Payer: Medicare HMO | Source: Ambulatory Visit | Attending: Family Medicine | Admitting: Family Medicine

## 2020-12-24 DIAGNOSIS — Z1231 Encounter for screening mammogram for malignant neoplasm of breast: Secondary | ICD-10-CM | POA: Insufficient documentation

## 2021-03-10 ENCOUNTER — Other Ambulatory Visit: Payer: Self-pay | Admitting: *Deleted

## 2021-03-10 MED ORDER — ALENDRONATE SODIUM 70 MG PO TABS: ORAL_TABLET | ORAL | 0 refills | Status: DC

## 2021-05-07 ENCOUNTER — Other Ambulatory Visit: Payer: Self-pay

## 2021-05-07 ENCOUNTER — Ambulatory Visit (INDEPENDENT_AMBULATORY_CARE_PROVIDER_SITE_OTHER): Payer: Medicare HMO | Admitting: Family Medicine

## 2021-05-07 DIAGNOSIS — R69 Illness, unspecified: Secondary | ICD-10-CM | POA: Diagnosis not present

## 2021-05-07 DIAGNOSIS — E785 Hyperlipidemia, unspecified: Secondary | ICD-10-CM | POA: Insufficient documentation

## 2021-05-07 DIAGNOSIS — F418 Other specified anxiety disorders: Secondary | ICD-10-CM | POA: Diagnosis not present

## 2021-05-07 DIAGNOSIS — M159 Polyosteoarthritis, unspecified: Secondary | ICD-10-CM | POA: Diagnosis not present

## 2021-05-07 DIAGNOSIS — M810 Age-related osteoporosis without current pathological fracture: Secondary | ICD-10-CM

## 2021-05-07 DIAGNOSIS — I10 Essential (primary) hypertension: Secondary | ICD-10-CM

## 2021-05-07 MED ORDER — ALENDRONATE SODIUM 70 MG PO TABS: ORAL_TABLET | ORAL | 1 refills | Status: DC

## 2021-05-07 MED ORDER — INDAPAMIDE 1.25 MG PO TABS
ORAL_TABLET | ORAL | 1 refills | Status: DC
Start: 2021-05-07 — End: 2021-11-04

## 2021-05-07 MED ORDER — CITALOPRAM HYDROBROMIDE 20 MG PO TABS
20.0000 mg | ORAL_TABLET | Freq: Every day | ORAL | 1 refills | Status: DC
Start: 2021-05-07 — End: 2021-11-26

## 2021-05-07 MED ORDER — POTASSIUM CHLORIDE ER 10 MEQ PO TBCR: 10.0000 meq | EXTENDED_RELEASE_TABLET | Freq: Every day | ORAL | 1 refills | Status: DC

## 2021-05-07 MED ORDER — LISINOPRIL 20 MG PO TABS: 20.0000 mg | ORAL_TABLET | Freq: Every morning | ORAL | 1 refills | Status: DC

## 2021-05-07 NOTE — Assessment & Plan Note (Signed)
Stable.  Continue Fosamax.

## 2021-05-07 NOTE — Patient Instructions (Signed)
Shingles vaccine when you can.  Continue your current medications.  Follow up in 6 months.  Take care  Dr. Lacinda Axon

## 2021-05-07 NOTE — Progress Notes (Signed)
Subjective:  Patient ID: Michelle Landry, female    DOB: [birth year]  Age: 65 y.o. MRN: [id number]  CC: Chief Complaint  Patient presents with   6 month follow     Refills    Arthritis    Hip and knee pain     HPI:  65 year old female with hypertension, osteoporosis, arthritis, depression and anxiety, hyperlipidemia presents for follow-up.  Hypertension Has been well controlled.  BP elevated today.  Patient unsure why.  She states that her readings are never this high. Patient states that she is compliant with indapamide and lisinopril.  Depression and anxiety Patient states that she is doing well on Celexa.  Osteoporosis Compliant with Fosamax.  No reported adverse side effects.  Osteoarthritis Patient states that she has pain of the left knee and the right hip.  Has known osteoarthritis.  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 Patient states that she uses ibuprofen for her discomfort when she needs to.  She may use it once or twice a week.  Patient Active Problem List   Diagnosis Date Noted   Hyperlipidemia 05/07/2021   Osteoarthritis 10/30/2015   Osteoporosis 12/03/2012   Hypertension 12/03/2012   Depression with anxiety 12/03/2012    Social Hx   Social History   Socioeconomic History   Marital status: Single    Spouse name: Not on file   Number of children: Not on file   Years of education: Not on file   Highest education level: Not on file  Occupational History   Not on file  Tobacco Use   Smoking status: Never   Smokeless tobacco: Never  Vaping Use   Vaping Use: Never used  Substance and Sexual Activity   Alcohol use: No    Alcohol/week: 0.0 standard drinks   Drug use: No   Sexual activity: Never    Birth control/protection: Abstinence  Other Topics Concern   Not on file  Social History Narrative   Not on file   Social Determinants of Health   Financial Resource Strain: Not on file  Food Insecurity: Not on file   Transportation Needs: Not on file  Physical Activity: Not on file  Stress: Not on file  Social Connections: Not on file    Review of Systems Per HPI  Objective:  BP (!) 152/65   Pulse (!) 57   Temp (!) 97.4 F (36.3 C)   Wt 178 lb (80.7 kg)   SpO2 96%   BMI 29.17 kg/m   BP/Weight 05/07/2021 12/02/2020 7/84/6962  Systolic BP 952 841 324  Diastolic BP 65 72 47  Wt. (Lbs) 178 184 184  BMI 29.17 30.15 30.15    Physical Exam Vitals and nursing note reviewed.  Constitutional:      General: She is not in acute distress.    Appearance: Normal appearance. She is not ill-appearing.  HENT:     Head: Normocephalic and atraumatic.  Cardiovascular:     Rate and Rhythm: Normal rate and regular rhythm.     Heart sounds: No murmur heard. Pulmonary:     Effort: Pulmonary effort is normal.     Breath sounds: Normal breath sounds. No wheezing, rhonchi or rales.  Neurological:     Mental Status: She is alert.  Psychiatric:        Mood and Affect: Mood normal.        Behavior: Behavior normal.    Lab Results  Component Value Date   WBC 4.5 12/02/2020  HGB 13.4 12/02/2020   HCT 41.3 12/02/2020   PLT 212 12/02/2020   GLUCOSE 90 12/02/2020   CHOL 179 12/02/2020   TRIG 93 12/02/2020   HDL 53 12/02/2020   LDLCALC 109 (H) 12/02/2020   ALT 23 12/02/2020   AST 21 12/02/2020   NA 143 12/02/2020   K 4.4 12/02/2020   CL 103 12/02/2020   CREATININE 0.70 12/02/2020   BUN 16 12/02/2020   CO2 25 12/02/2020   TSH 1.620 12/02/2020   INR 0.95 12/17/2010     Assessment & Plan:   Problem List Items Addressed This Visit       Cardiovascular and Mediastinum   Hypertension    BP elevated today.  Patient states that her blood pressures are not normally this high.  Was rechecked and was still elevated.  Advised to check her blood pressures regularly at home.  Continue indapamide and lisinopril.      Relevant Medications   lisinopril (ZESTRIL) 20 MG tablet   potassium chloride  (KLOR-CON) 10 MEQ tablet   indapamide (LOZOL) 1.25 MG tablet     Musculoskeletal and Integument   Osteoporosis    Stable.  Continue Fosamax.      Relevant Medications   alendronate (FOSAMAX) 70 MG tablet   Osteoarthritis    Relatively stable.  Using NSAIDs occasionally.  Advised her that she may continue intermittent use.  If she finds her self taking these more regular/chronically we will need to discuss.        Other   Depression with anxiety    Stable.  Continue Celexa.      Relevant Medications   citalopram (CELEXA) 20 MG tablet    Meds ordered this encounter  Medications   alendronate (FOSAMAX) 70 MG tablet    Sig: TAKE 1 TABLET BY MOUTH ONCE A WEEK. TAKE WITH A FULL GLASS OF WATER ON AN EMPTY STOMACH.    Dispense:  12 tablet    Refill:  1   citalopram (CELEXA) 20 MG tablet    Sig: Take 1 tablet (20 mg total) by mouth at bedtime.    Dispense:  90 tablet    Refill:  1   lisinopril (ZESTRIL) 20 MG tablet    Sig: Take 1 tablet (20 mg total) by mouth every morning.    Dispense:  90 tablet    Refill:  1   potassium chloride (KLOR-CON) 10 MEQ tablet    Sig: Take 1 tablet (10 mEq total) by mouth daily.    Dispense:  90 tablet    Refill:  1   indapamide (LOZOL) 1.25 MG tablet    Sig: TAKE 1 TABLET BY MOUTH IN THE MORNING FOR BLOOD PRESSURE AND FOR FLUID    Dispense:  90 tablet    Refill:  1    Follow-up:  Return in about 6 months (around 11/05/2021) for Follow up Chronic medical issues.  [place]

## 2021-05-07 NOTE — Assessment & Plan Note (Signed)
Stable ?Continue Celexa ?

## 2021-05-07 NOTE — Assessment & Plan Note (Signed)
BP elevated today.  Patient states that her blood pressures are not normally this high.  Was rechecked and was still elevated.  Advised to check her blood pressures regularly at home.  Continue indapamide and lisinopril.

## 2021-05-07 NOTE — Assessment & Plan Note (Signed)
Relatively stable.  Using NSAIDs occasionally.  Advised her that she may continue intermittent use.  If she finds her self taking these more regular/chronically we will need to discuss.

## 2021-05-18 ENCOUNTER — Telehealth: Payer: Self-pay | Admitting: Family Medicine

## 2021-05-18 NOTE — Telephone Encounter (Signed)
Left message for patient to call back and schedule Medicare Annual Wellness Visit (AWV) in office.   If unable to come into the office for AWV,  please offer to do virtually or by telephone.  Last AWV: 05/08/2018  Please schedule at anytime with [facility].  40 minute appointment  Any questions, please contact me at [phone]

## 2021-06-02 ENCOUNTER — Ambulatory Visit (INDEPENDENT_AMBULATORY_CARE_PROVIDER_SITE_OTHER): Payer: Medicare HMO

## 2021-06-02 ENCOUNTER — Other Ambulatory Visit: Payer: Self-pay

## 2021-06-02 VITALS — BP 136/80 | HR 54 | Ht 66.0 in | Wt 179.0 lb

## 2021-06-02 DIAGNOSIS — Z0000 Encounter for general adult medical examination without abnormal findings: Secondary | ICD-10-CM

## 2021-06-02 NOTE — Patient Instructions (Addendum)
Michelle Landry , Thank you for taking time to come for your Medicare Wellness Visit. I appreciate your ongoing commitment to your health goals. Please review the following plan we discussed and let me know if I can assist you in the future.   Screening recommendations/referrals: Colonoscopy: Done 08/04/2020 Repeat in 5 years  Mammogram: Done 12/24/2020 Repeat annually  Bone Density: Done 06/27/2019 Repeat every 2 years  Recommended yearly ophthalmology/optometry visit for glaucoma screening and checkup Recommended yearly dental visit for hygiene and checkup  Vaccinations: Influenza vaccine: Done 03/30/2021 Repeat annually  Pneumococcal vaccine: Done 12/05/2019. Second dose due. Tdap vaccine: Done 04/30/2015. Shingles vaccine: Done 06/24/2014   Covid-19:Done 08/16/2019, 09/15/2019 and 06/02/2020   Advanced directives: Advance directive discussed with you today. Even though you declined this today, please call our office should you change your mind, and we can give you the proper paperwork for you to fill out.   Conditions/risks identified: KEEP UP THE GOOD WORK!!!  Next appointment: Follow up in one year for your annual wellness visit 06/08/2022 @ 9 am.    Preventive Care 65 Years and Older, Female Preventive care refers to lifestyle choices and visits with your health care provider that can promote health and wellness. What does preventive care include? A yearly physical exam. This is also called an annual well check. Dental exams once or twice a year. Routine eye exams. Ask your health care provider how often you should have your eyes checked. Personal lifestyle choices, including: Daily care of your teeth and gums. Regular physical activity. Eating a healthy diet. Avoiding tobacco and drug use. Limiting alcohol use. Practicing safe sex. Taking low-dose aspirin every day. Taking vitamin and mineral supplements as recommended by your health care provider. What happens during an annual  well check? The services and screenings done by your health care provider during your annual well check will depend on your age, overall health, lifestyle risk factors, and family history of disease. Counseling  Your health care provider may ask you questions about your: Alcohol use. Tobacco use. Drug use. Emotional well-being. Home and relationship well-being. Sexual activity. Eating habits. History of falls. Memory and ability to understand (cognition). Work and work Statistician. Reproductive health. Screening  You may have the following tests or measurements: Height, weight, and BMI. Blood pressure. Lipid and cholesterol levels. These may be checked every 5 years, or more frequently if you are over 50 years old. Skin check. Lung cancer screening. You may have this screening every year starting at age 63 if you have a 30-pack-year history of smoking and currently smoke or have quit within the past 15 years. Fecal occult blood test (FOBT) of the stool. You may have this test every year starting at age 40. Flexible sigmoidoscopy or colonoscopy. You may have a sigmoidoscopy every 5 years or a colonoscopy every 10 years starting at age 41. Hepatitis C blood test. Hepatitis B blood test. Sexually transmitted disease (STD) testing. Diabetes screening. This is done by checking your blood sugar (glucose) after you have not eaten for a while (fasting). You may have this done every 1-3 years. Bone density scan. This is done to screen for osteoporosis. You may have this done starting at age 53. Mammogram. This may be done every 1-2 years. Talk to your health care provider about how often you should have regular mammograms. Talk with your health care provider about your test results, treatment options, and if necessary, the need for more tests. Vaccines  Your health care provider may  recommend certain vaccines, such as: Influenza vaccine. This is recommended every year. Tetanus, diphtheria,  and acellular pertussis (Tdap, Td) vaccine. You may need a Td booster every 10 years. Zoster vaccine. You may need this after age 3. Pneumococcal 13-valent conjugate (PCV13) vaccine. One dose is recommended after age 52. Pneumococcal polysaccharide (PPSV23) vaccine. One dose is recommended after age 69. Talk to your health care provider about which screenings and vaccines you need and how often you need them. This information is not intended to replace advice given to you by your health care provider. Make sure you discuss any questions you have with your health care provider. Document Released: 06/20/2015 Document Revised: 02/11/2016 Document Reviewed: 03/25/2015 Elsevier Interactive Patient Education  2017 [employer] Prevention in the Home Falls can cause injuries. They can happen to people of all ages. There are many things you can do to make your home safe and to help prevent falls. What can I do on the outside of my home? Regularly fix the edges of walkways and driveways and fix any cracks. Remove anything that might make you trip as you walk through a door, such as a raised step or threshold. Trim any bushes or trees on the path to your home. Use bright outdoor lighting. Clear any walking paths of anything that might make someone trip, such as rocks or tools. Regularly check to see if handrails are loose or broken. Make sure that both sides of any steps have handrails. Any raised decks and porches should have guardrails on the edges. Have any leaves, snow, or ice cleared regularly. Use sand or salt on walking paths during winter. Clean up any spills in your garage right away. This includes oil or grease spills. What can I do in the bathroom? Use night lights. Install grab bars by the toilet and in the tub and shower. Do not use towel bars as grab bars. Use non-skid mats or decals in the tub or shower. If you need to sit down in the shower, use a plastic, non-slip  stool. Keep the floor dry. Clean up any water that spills on the floor as soon as it happens. Remove soap buildup in the tub or shower regularly. Attach bath mats securely with double-sided non-slip rug tape. Do not have throw rugs and other things on the floor that can make you trip. What can I do in the bedroom? Use night lights. Make sure that you have a light by your bed that is easy to reach. Do not use any sheets or blankets that are too big for your bed. They should not hang down onto the floor. Have a firm chair that has side arms. You can use this for support while you get dressed. Do not have throw rugs and other things on the floor that can make you trip. What can I do in the kitchen? Clean up any spills right away. Avoid walking on wet floors. Keep items that you use a lot in easy-to-reach places. If you need to reach something above you, use a strong step stool that has a grab bar. Keep electrical cords out of the way. Do not use floor polish or wax that makes floors slippery. If you must use wax, use non-skid floor wax. Do not have throw rugs and other things on the floor that can make you trip. What can I do with my stairs? Do not leave any items on the stairs. Make sure that there are handrails on both sides of  the stairs and use them. Fix handrails that are broken or loose. Make sure that handrails are as long as the stairways. Check any carpeting to make sure that it is firmly attached to the stairs. Fix any carpet that is loose or worn. Avoid having throw rugs at the top or bottom of the stairs. If you do have throw rugs, attach them to the floor with carpet tape. Make sure that you have a light switch at the top of the stairs and the bottom of the stairs. If you do not have them, ask someone to add them for you. What else can I do to help prevent falls? Wear shoes that: Do not have high heels. Have rubber bottoms. Are comfortable and fit you well. Are closed at the  toe. Do not wear sandals. If you use a stepladder: Make sure that it is fully opened. Do not climb a closed stepladder. Make sure that both sides of the stepladder are locked into place. Ask someone to hold it for you, if possible. Clearly mark and make sure that you can see: Any grab bars or handrails. First and last steps. Where the edge of each step is. Use tools that help you move around (mobility aids) if they are needed. These include: Canes. Walkers. Scooters. Crutches. Turn on the lights when you go into a dark area. Replace any light bulbs as soon as they burn out. Set up your furniture so you have a clear path. Avoid moving your furniture around. If any of your floors are uneven, fix them. If there are any pets around you, be aware of where they are. Review your medicines with your doctor. Some medicines can make you feel dizzy. This can increase your chance of falling. Ask your doctor what other things that you can do to help prevent falls. This information is not intended to replace advice given to you by your health care provider. Make sure you discuss any questions you have with your health care provider. Document Released: 03/20/2009 Document Revised: 10/30/2015 Document Reviewed: 06/28/2014 Elsevier Interactive Patient Education  2017 [employer].

## 2021-06-02 NOTE — Progress Notes (Signed)
Subjective:   Michelle Landry is a 65 y.o. female who presents for Medicare Annual (Subsequent) preventive examination.  Review of Systems     Cardiac Risk Factors include: advanced age (>45men, >33 women);hypertension;sedentary lifestyle     Objective:    Today's Vitals   06/02/21 0857  BP: 136/80  Pulse: (!) 54  SpO2: 100%  Weight: 179 lb (81.2 kg)  Height: 5\' 6"  (1.676 m)   Body mass index is 28.89 kg/m.  Advanced Directives 06/02/2021 08/04/2020 01/31/2018 06/04/2015  Does Patient Have a Medical Advance Directive? No No No No  Would patient like information on creating a medical advance directive? No - Patient declined Yes (MAU/Ambulatory/Procedural Areas - Information given) - No - patient declined information    Current Medications (verified) Outpatient Encounter Medications as of 06/02/2021  Medication Sig   alendronate (FOSAMAX) 70 MG tablet TAKE 1 TABLET BY MOUTH ONCE A WEEK. TAKE WITH A FULL GLASS OF WATER ON AN EMPTY STOMACH.   cholecalciferol (VITAMIN D) 25 MCG (1000 UNIT) tablet Take 1,000 Units by mouth daily.   citalopram (CELEXA) 20 MG tablet Take 1 tablet (20 mg total) by mouth at bedtime.   FLUZONE HIGH-DOSE QUADRIVALENT 0.7 ML SUSY    ibuprofen (ADVIL) 200 MG tablet Take 400 mg by mouth every 8 (eight) hours as needed (arthritis pain.).   indapamide (LOZOL) 1.25 MG tablet TAKE 1 TABLET BY MOUTH IN THE MORNING FOR BLOOD PRESSURE AND FOR FLUID   lisinopril (ZESTRIL) 20 MG tablet Take 1 tablet (20 mg total) by mouth every morning.   meclizine (ANTIVERT) 25 MG tablet Take 1 tablet (25 mg total) by mouth 2 (two) times daily as needed for dizziness.   Multiple Vitamin (MULTIVITAMIN WITH MINERALS) TABS tablet Take 1 tablet by mouth daily.   PNEUMOVAX 23 25 MCG/0.5ML injection    potassium chloride (KLOR-CON) 10 MEQ tablet Take 1 tablet (10 mEq total) by mouth daily.   vitamin C (ASCORBIC ACID) 500 MG tablet Take 500 mg by mouth daily.   No facility-administered  encounter medications on file as of 06/02/2021.    Allergies (verified) Sulfa antibiotics   History: Past Medical History:  Diagnosis Date   Allergic rhinitis    History of bronchitis    Hypertension    Osteoporosis 2012   Past Surgical History:  Procedure Laterality Date   COLONOSCOPY  2006 MJ   ?POLYPS   COLONOSCOPY N/A 06/04/2015   Procedure: COLONOSCOPY;  Surgeon: Danie Binder, MD;  [facility];  Service: Endoscopy;  Laterality: N/A;  9:15   COLONOSCOPY WITH PROPOFOL N/A 08/04/2020   Procedure: COLONOSCOPY WITH PROPOFOL;  Surgeon: Eloise Harman, DO;  [facility];  Service: Endoscopy;  Laterality: N/A;  ASA II/ 9:00   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   POLYPECTOMY  08/04/2020   Procedure: POLYPECTOMY INTESTINAL;  Surgeon: Eloise Harman, DO;  [facility];  Service: Endoscopy;;  cecal colon polyp; sigmoid colon polyp;    Right Knee Replacement  12/2010   Family History  Problem Relation Age of Onset   Hypertension Mother    Osteoporosis Mother    Heart disease Father 21       MI   Hypertension Sister    Diabetes Sister    Heart disease Sister    Cancer Sister        breast   Social History   Socioeconomic History   Marital status: Single    Spouse name: Not on  file   Number of children: Not on file   Years of education: Not on file   Highest education level: Not on file  Occupational History   Not on file  Tobacco Use   Smoking status: Never   Smokeless tobacco: Never  Vaping Use   Vaping Use: Never used  Substance and Sexual Activity   Alcohol use: No    Alcohol/week: 0.0 standard drinks   Drug use: No   Sexual activity: Never    Birth control/protection: Abstinence  Other Topics Concern   Not on file  Social History Narrative   Lives alone, has boston terrier named Maramec.   Social Determinants of Health   Financial Resource Strain: Low Risk    Difficulty of Paying Living Expenses: Not hard at all  Food  Insecurity: No Food Insecurity   Worried About Charity fundraiser in the Last Year: Never true   Page in the Last Year: Never true  Transportation Needs: No Transportation Needs   Lack of Transportation (Medical): No   Lack of Transportation (Non-Medical): No  Physical Activity: Sufficiently Active   Days of Exercise per Week: 5 days   Minutes of Exercise per Session: 30 min  Stress: No Stress Concern Present   Feeling of Stress : Not at all  Social Connections: Moderately Integrated   Frequency of Communication with Friends and Family: More than three times a week   Frequency of Social Gatherings with Friends and Family: More than three times a week   Attends Religious Services: More than 4 times per year   Active Member of [employer] or Organizations: Yes   Attends Music therapist: More than 4 times per year   Marital Status: Never married    Tobacco Counseling Counseling given: Not Answered   Clinical Intake:  Pre-visit preparation completed: Yes  Pain : No/denies pain     BMI - recorded: 28.89 Nutritional Status: BMI 25 -29 Overweight Nutritional Risks: None Diabetes: No  How often do you need to have someone help you when you read instructions, pamphlets, or other written materials from your doctor or pharmacy?: 1 - Never  Diabetic?NO  Interpreter Needed?: No  Information entered by :: MJ Demon Volante, LPN   Activities of Daily Living In your present state of health, do you have any difficulty performing the following activities: 06/02/2021  Hearing? N  Vision? N  Difficulty concentrating or making decisions? Y  Comment At times.  Walking or climbing stairs? Y  Dressing or bathing? N  Doing errands, shopping? N  Preparing Food and eating ? N  Using the Toilet? N  In the past six months, have you accidently leaked urine? Y  Comment Stress incontinence at times.  Do you have problems with loss of bowel control? N  Managing your  Medications? N  Managing your Finances? N  Housekeeping or managing your Housekeeping? N  Some recent data might be hidden    Patient Care Team: Coral Spikes, DO as PCP - General (Family Medicine) Eloise Harman, DO as Consulting Physician (Internal Medicine) Coral Spikes, DO as Consulting Physician (Family Medicine)  Indicate any recent [place] you may have received from other than [facility] providers in the past year (date may be approximate).     Assessment:   This is a routine wellness examination for Gustavo.  Hearing/Vision screen Hearing Screening - Comments:: No hearing issues.  Vision Screening - Comments:: Readers. [facility] &gt;2 years.  Dietary issues and exercise activities discussed: Current Exercise Habits: Home exercise routine, Type of exercise: treadmill, Time (Minutes): 30, Frequency (Times/Week): 5, Weekly Exercise (Minutes/Week): 150, Intensity: Mild, Exercise limited by: cardiac condition(s)   Goals Addressed             This Visit's Progress    DIET - REDUCE CALORIE INTAKE       Pt would like to lose weight.       Depression Screen PHQ 2/9 Scores 06/02/2021 05/07/2021 12/02/2020 06/03/2020 12/05/2019 05/30/2019 05/08/2018  PHQ - 2 Score 0 0 0 0 0 0 0  PHQ- 9 Score - - 4 - 1 - 0    Fall Risk Fall Risk  06/02/2021 05/07/2021 12/02/2020 06/03/2020 05/30/2019  Falls in the past year? 0 0 0 0 0  Number falls in past yr: 0 0 - - -  Injury with Fall? 0 0 - - -  Risk for fall due to : Impaired mobility;Orthopedic patient No Fall Risks - - -  Follow up Falls prevention discussed Falls evaluation completed Falls evaluation completed Falls evaluation completed -    FALL RISK PREVENTION PERTAINING TO THE HOME:  Any stairs in or around the home? Yes  If so, are there any without handrails? No  Home free of loose throw rugs in walkways, pet beds, electrical cords, etc? Yes  Adequate lighting in your home to reduce risk of falls? Yes   ASSISTIVE  DEVICES UTILIZED TO PREVENT FALLS:  Life alert? No  Use of a cane, walker or w/c? No  Grab bars in the bathroom? Yes  Shower chair or bench in shower? Yes  Elevated toilet seat or a handicapped toilet? Yes   TIMED UP AND GO:  Was the test performed? Yes .  Length of time to ambulate 10 feet: 10 sec.   Gait steady and fast without use of assistive device  Cognitive Function:     6CIT Screen 06/02/2021  What Year? 0 points  What month? 0 points  What time? 0 points  Count back from 20 0 points  Months in reverse 0 points  Repeat phrase 0 points  Total Score 0    Immunizations Immunization History  Administered Date(s) Administered   Influenza, Quadrivalent, Recombinant, Inj, Pf 03/15/2019   Influenza,inj,Quad PF,6+ Mos 03/13/2015, 04/16/2016, 03/28/2017   Influenza-Unspecified 04/07/2012, 03/21/2013, 04/08/2014, 03/07/2018, 03/26/2020, 03/30/2021   Moderna Sars-Covid-2 Vaccination 08/16/2019, 09/15/2019, 06/02/2020   Pneumococcal Conjugate-13 12/05/2019   Pneumococcal-Unspecified 03/30/2021   Td 01/01/2005, 04/30/2015   Zoster, Live 06/24/2014    TDAP status: Up to date  Flu Vaccine status: Up to date  Pneumococcal vaccine status: Up to date  Covid-19 vaccine status: Completed vaccines  Qualifies for Shingles Vaccine? Yes   Zostavax completed No   Shingrix Completed?: No.    Education has been provided regarding the importance of this vaccine. Patient has been advised to call insurance company to determine out of pocket expense if they have not yet received this vaccine. Advised may also receive vaccine at local pharmacy or Health Dept. Verbalized acceptance and understanding.  Screening Tests Health Maintenance  Topic Date Due   Zoster Vaccines- Shingrix (1 of 2) Never done   DEXA SCAN  06/26/2021   Pneumonia Vaccine 74+ Years old (2 - PPSV23 if available, else PCV20) 03/30/2022   MAMMOGRAM  12/25/2022   TETANUS/TDAP  04/29/2025   COLONOSCOPY (Pts 45-73yrs  Insurance coverage will need to be confirmed)  08/04/2025   INFLUENZA VACCINE  Completed  Hepatitis C Screening  Completed   HPV VACCINES  Aged Out   COVID-19 Vaccine  Discontinued    Health Maintenance  Health Maintenance Due  Topic Date Due   Zoster Vaccines- Shingrix (1 of 2) Never done    Colorectal cancer screening: Type of screening: Colonoscopy. Completed 08/04/2020. Repeat every 5 years  Mammogram status: Completed 12/24/2020. Repeat every year  Bone Density status: Completed 06/27/2019. Results reflect: Bone density results: OSTEOPOROSIS. Repeat every 2 years.  Lung Cancer Screening: (Low Dose CT Chest recommended if Age 38-80 years, 30 pack-year currently smoking OR have quit w/in 15years.) does not qualify.  NON SMOKER.  Additional Screening:  Hepatitis C Screening: does qualify; Completed 05/05/2015  Vision Screening: Recommended annual ophthalmology exams for early detection of glaucoma and other disorders of the eye. Is the patient up to date with their annual eye exam?  No  Who is the provider or what is the name of the office in which the patient attends annual eye exams? [facility] in [place]. If pt is not established with a provider, would they like to be referred to a provider to establish care? No .   Dental Screening: Recommended annual dental exams for proper oral hygiene  Community Resource Referral / Chronic Care Management: CRR required this visit?  No   CCM required this visit?  No      Plan:     I have personally reviewed and noted the following in the patients chart:   Medical and social history Use of alcohol, tobacco or illicit drugs  Current medications and supplements including opioid prescriptions.  Functional ability and status Nutritional status Physical activity Advanced directives List of other physicians Hospitalizations, surgeries, and ER visits in previous 12 months Vitals Screenings to include cognitive, depression, and  falls Referrals and appointments  In addition, I have reviewed and discussed with patient certain preventive protocols, quality metrics, and best practice recommendations. A written personalized care plan for preventive services as well as general preventive health recommendations were provided to patient.     Chriss Driver, LPN   [id number]   Nurse Notes: Pt is up to date with all health maintenance. Pt encouraged to call and schedule eye exam. Pt verbalized understanding. Pt has plans to get 2nd Shingles vaccine in Jan 2023. 6CIT Score of 0.  IN OFFICE VISIT AT [facility].

## 2021-06-08 ENCOUNTER — Emergency Department (HOSPITAL_COMMUNITY)
Admission: EM | Admit: 2021-06-08 | Discharge: 2021-06-08 | Disposition: A | Discharge status: Home / Self Care | Payer: Medicare HMO | Attending: Emergency Medicine | Admitting: Emergency Medicine

## 2021-06-08 ENCOUNTER — Other Ambulatory Visit: Payer: Self-pay

## 2021-06-08 ENCOUNTER — Emergency Department (HOSPITAL_COMMUNITY): Payer: Medicare HMO

## 2021-06-08 DIAGNOSIS — M7989 Other specified soft tissue disorders: Secondary | ICD-10-CM | POA: Diagnosis not present

## 2021-06-08 DIAGNOSIS — Z96651 Presence of right artificial knee joint: Secondary | ICD-10-CM | POA: Insufficient documentation

## 2021-06-08 DIAGNOSIS — I10 Essential (primary) hypertension: Secondary | ICD-10-CM | POA: Insufficient documentation

## 2021-06-08 DIAGNOSIS — M79672 Pain in left foot: Secondary | ICD-10-CM | POA: Insufficient documentation

## 2021-06-08 DIAGNOSIS — R600 Localized edema: Secondary | ICD-10-CM | POA: Diagnosis not present

## 2021-06-08 NOTE — ED Provider Notes (Signed)
[facility] Emergency Department Provider Note MRN:  [id number]  Arrival date & time: 06/08/21     Chief Complaint   Foot Pain   History of Present Illness   Michelle Landry is a 66 y.o. year-old female with a history of hypertension, history of present presenting to the ED with chief complaint of foot pain.  Patient dropped a heavy metal bar onto the dorsum of the left foot shortly prior to arrival.  Immediate moderate to severe pain, pain is worse with ambulating.  Denies any other injuries, did not fall, no head trauma.  Bruising to the foot.  Review of Systems  A thorough review of systems was obtained and all systems are negative except as noted in the HPI and PMH.   Patient's Health History    Past Medical History:  Diagnosis Date   Allergic rhinitis    History of bronchitis    Hypertension    Osteoporosis 2012    Past Surgical History:  Procedure Laterality Date   COLONOSCOPY  2006 MJ   ?POLYPS   COLONOSCOPY N/A 06/04/2015   Procedure: COLONOSCOPY;  Surgeon: Danie Binder, MD;  [facility];  Service: Endoscopy;  Laterality: N/A;  9:15   COLONOSCOPY WITH PROPOFOL N/A 08/04/2020   Procedure: COLONOSCOPY WITH PROPOFOL;  Surgeon: Eloise Harman, DO;  [facility];  Service: Endoscopy;  Laterality: N/A;  ASA II/ 9:00   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   POLYPECTOMY  08/04/2020   Procedure: POLYPECTOMY INTESTINAL;  Surgeon: Eloise Harman, DO;  [facility];  Service: Endoscopy;;  cecal colon polyp; sigmoid colon polyp;    Right Knee Replacement  12/2010    Family History  Problem Relation Age of Onset   Hypertension Mother    Osteoporosis Mother    Heart disease Father 37       MI   Hypertension Sister    Diabetes Sister    Heart disease Sister    Cancer Sister        breast    Social History   Socioeconomic History   Marital status: Single    Spouse name: Not on file   Number of children: Not on file    Years of education: Not on file   Highest education level: Not on file  Occupational History   Not on file  Tobacco Use   Smoking status: Never   Smokeless tobacco: Never  Vaping Use   Vaping Use: Never used  Substance and Sexual Activity   Alcohol use: No    Alcohol/week: 0.0 standard drinks   Drug use: No   Sexual activity: Never    Birth control/protection: Abstinence  Other Topics Concern   Not on file  Social History Narrative   Lives alone, has boston terrier named [place].   Social Determinants of Health   Financial Resource Strain: Low Risk    Difficulty of Paying Living Expenses: Not hard at all  Food Insecurity: No Food Insecurity   Worried About Charity fundraiser in the Last Year: Never true   [place] in the Last Year: Never true  Transportation Needs: No Transportation Needs   Lack of Transportation (Medical): No   Lack of Transportation (Non-Medical): No  Physical Activity: Sufficiently Active   Days of Exercise per Week: 5 days   Minutes of Exercise per Session: 30 min  Stress: No Stress Concern Present   Feeling of Stress : Not at all  Social Connections: Moderately Integrated   Frequency of Communication with Friends and Family: More than three times a week   Frequency of Social Gatherings with Friends and Family: More than three times a week   Attends Religious Services: More than 4 times per year   Active Member of [employer] or Organizations: Yes   Attends Music therapist: More than 4 times per year   Marital Status: Never married  Human resources officer Violence: Not At Risk   Fear of Current or Ex-Partner: No   Emotionally Abused: No   Physically Abused: No   Sexually Abused: No     Physical Exam   Vitals:   06/08/21 2156  BP: (!) 156/71  Pulse: (!) 59  Resp: 15  Temp: 98.2 F (36.8 C)  SpO2: 96%    CONSTITUTIONAL: Well-appearing, NAD NEURO:  Alert and oriented x 3, no focal deficits EYES:  eyes equal and  reactive ENT/NECK:  no LAD, no JVD CARDIO: Regular rate, well-perfused, normal S1 and S2 PULM:  CTAB no wheezing or rhonchi GI/GU:  non-distended, non-tender MSK/SPINE:  No gross deformities, no edema SKIN: Bruising to the dorsum of the left foot with overlying abrasion   *Additional and/or pertinent findings included in MDM below  Diagnostic and Interventional Summary    EKG Interpretation  Date/Time:    Ventricular Rate:    PR Interval:    QRS Duration:   QT Interval:    QTC Calculation:   R Axis:     Text Interpretation:         Labs Reviewed - No data to display  DG Foot Complete Left  Final Result      Medications - No data to display   Procedures  /  Critical Care Procedures  ED Course and Medical Decision Making  Initial Impression and Ddx Trauma to the dorsum of the left foot, considering soft tissue injury versus fracture.  Neurovascularly intact distally, awaiting x-ray  Interpretation of Diagnostics I personally reviewed the foot x-ray and my interpretation is as follows: Overall doubt acute fracture, there is question of irregularity to the phalanges, however this is not the location of the trauma.      Patient Reassessment and Ultimate Disposition/Management Patient is reassured that there is no fracture, appropriate for discharge.   Complexity of Problems Addressed Acute complicated illness or Injury  Additional Data Reviewed and Analyzed Further history obtained from: Past medical history and medications listed in the EMR  Patient Encounter Risk Assessment Low  Barth Kirks. [place] Small, [place] [email or web address]  Final Clinical Impressions(s) / ED Diagnoses     ICD-10-CM   1. Foot pain, left  M79.672       ED Discharge Orders     None        Discharge Instructions Discussed with and Provided to Patient:    Discharge Instructions      You were evaluated in the Emergency  Department and after careful evaluation, we did not find any emergent condition requiring admission or further testing in the hospital.  Your exam/testing today was overall reassuring.  X-ray did not show any broken bones.  Recommend Tylenol and/or Motrin for pain.  Recommend cold compresses for the next 2 days to help with the swelling.  Can use crutches and/or postoperative shoe as needed for comfort over the next week or 2.  Please return to the Emergency Department if you experience any worsening of your condition.  Thank you  for allowing [place] to be a part of your care.       Maudie Flakes, MD 06/08/21 [phone]

## 2021-06-08 NOTE — Discharge Instructions (Signed)
You were evaluated in the Emergency Department and after careful evaluation, we did not find any emergent condition requiring admission or further testing in the hospital.  Your exam/testing today was overall reassuring.  X-ray did not show any broken bones.  Recommend Tylenol and/or Motrin for pain.  Recommend cold compresses for the next 2 days to help with the swelling.  Can use crutches and/or postoperative shoe as needed for comfort over the next week or 2.  Please return to the Emergency Department if you experience any worsening of your condition.  Thank you for allowing [place] to be a part of your care.

## 2021-06-08 NOTE — ED Triage Notes (Signed)
Left foot pain after dropping metal bar on it around 2000.

## 2021-09-03 ENCOUNTER — Ambulatory Visit (INDEPENDENT_AMBULATORY_CARE_PROVIDER_SITE_OTHER): Payer: Medicare HMO | Admitting: Family Medicine

## 2021-09-03 VITALS — BP 163/79 | HR 74 | Temp 98.7°F | Wt 182.2 lb

## 2021-09-03 DIAGNOSIS — R0989 Other specified symptoms and signs involving the circulatory and respiratory systems: Secondary | ICD-10-CM | POA: Diagnosis not present

## 2021-09-03 MED ORDER — ALBUTEROL SULFATE HFA 108 (90 BASE) MCG/ACT IN AERS: 2.0000 | INHALATION_SPRAY | Freq: Four times a day (QID) | RESPIRATORY_TRACT | 0 refills | Status: DC | PRN

## 2021-09-03 MED ORDER — PROMETHAZINE-DM 6.25-15 MG/5ML PO SYRP: 5.0000 mL | ORAL_SOLUTION | Freq: Four times a day (QID) | ORAL | 0 refills | Status: DC | PRN

## 2021-09-03 MED ORDER — IPRATROPIUM BROMIDE 0.06 % NA SOLN: 2.0000 | Freq: Four times a day (QID) | NASAL | 0 refills | Status: DC | PRN

## 2021-09-03 MED ORDER — DOXYCYCLINE HYCLATE 100 MG PO TABS: 100.0000 mg | ORAL_TABLET | Freq: Two times a day (BID) | ORAL | 0 refills | Status: DC

## 2021-09-03 NOTE — Progress Notes (Signed)
? ?Subjective:  ?Patient ID: Michelle Landry, female    DOB: [birth year]  Age: 66 y.o. MRN: [id number] ? ?CC: ?Chief Complaint  ?Patient presents with  ? Nasal Congestion  ?  Patient has been sick since last week  ? eyes have been draining  ? Cough  ? ? ?HPI: ? ?66 year old female presents for evaluation of the above. ? ?Patient reports that she has been not feeling well for the past week.  She reports congestion, runny nose, sore throat, cough, and associated headache.  No fever.  She has been taking Zyrtec and Alka-Seltzer without relief.  Pollen may be an exacerbating factor.  No reported sick contacts.  No other associated symptoms.  No other complaints. ? ?Patient Active Problem List  ? Diagnosis Date Noted  ? Respiratory symptoms 09/03/2021  ? Hyperlipidemia 05/07/2021  ? Osteoarthritis of left knee 04/17/2018  ? Osteoarthritis 10/30/2015  ? Osteoporosis 12/03/2012  ? Hypertension 12/03/2012  ? Depression with anxiety 12/03/2012  ? ? ?Social Hx   ?Social History  ? ?Socioeconomic History  ? Marital status: Single  ?  Spouse name: Not on file  ? Number of children: Not on file  ? Years of education: Not on file  ? Highest education level: Not on file  ?Occupational History  ? Not on file  ?Tobacco Use  ? Smoking status: Never  ? Smokeless tobacco: Never  ?Vaping Use  ? Vaping Use: Never used  ?Substance and Sexual Activity  ? Alcohol use: No  ?  Alcohol/week: 0.0 standard drinks  ? Drug use: No  ? Sexual activity: Never  ?  Birth control/protection: Abstinence  ?Other Topics Concern  ? Not on file  ?Social History Narrative  ? Lives alone, has boston terrier named Edmundson.  ? ?Social Determinants of Health  ? ?Financial Resource Strain: Low Risk   ? Difficulty of Paying Living Expenses: Not hard at all  ?Food Insecurity: No Food Insecurity  ? Worried About Charity fundraiser in the Last Year: Never true  ? Ran Out of Food in the Last Year: Never true  ?Transportation Needs: No Transportation Needs  ? Lack of  Transportation (Medical): No  ? Lack of Transportation (Non-Medical): No  ?Physical Activity: Sufficiently Active  ? Days of Exercise per Week: 5 days  ? Minutes of Exercise per Session: 30 min  ?Stress: No Stress Concern Present  ? Feeling of Stress : Not at all  ?Social Connections: Moderately Integrated  ? Frequency of Communication with Friends and Family: More than three times a week  ? Frequency of Social Gatherings with Friends and Family: More than three times a week  ? Attends Religious Services: More than 4 times per year  ? Active Member of Clubs or Organizations: Yes  ? Attends Archivist Meetings: More than 4 times per year  ? Marital Status: Never married  ? ? ?Review of Systems ?Per HPI ? ?Objective:  ?BP (!) 163/79   Pulse 74   Temp 98.7 ?F (37.1 ?C) (Oral)   Wt 182 lb 3.2 oz (82.6 kg)   SpO2 95%   BMI 29.41 kg/m?  ? ? ?  09/03/2021  ?  2:17 PM 06/08/2021  ? 11:52 PM 06/08/2021  ?  9:56 PM  ?BP/Weight  ?Systolic BP 409 811 914  ?Diastolic BP 79 61 71  ?Wt. (Lbs) 182.2    ?BMI 29.41 kg/m2    ? ? ?Physical Exam ?Vitals and nursing note reviewed.  ?Constitutional:   ?  General: She is not in acute distress. ?   Appearance: Normal appearance. She is not ill-appearing.  ?HENT:  ?   Head: Normocephalic and atraumatic.  ?   Right Ear: Tympanic membrane normal.  ?   Left Ear: Tympanic membrane normal.  ?   Mouth/Throat:  ?   Pharynx: Posterior oropharyngeal erythema present. No oropharyngeal exudate.  ?Eyes:  ?   General:     ?   Right eye: No discharge.     ?   Left eye: No discharge.  ?   Conjunctiva/sclera: Conjunctivae normal.  ?Cardiovascular:  ?   Rate and Rhythm: Normal rate and regular rhythm.  ?Pulmonary:  ?   Effort: Pulmonary effort is normal.  ?   Breath sounds: Normal breath sounds. No wheezing or rales.  ?Neurological:  ?   Mental Status: She is alert.  ?Psychiatric:     ?   Mood and Affect: Mood normal.     ?   Behavior: Behavior normal.  ? ? ?Lab Results  ?Component Value Date  ?  WBC 4.5 12/02/2020  ? HGB 13.4 12/02/2020  ? HCT 41.3 12/02/2020  ? PLT 212 12/02/2020  ? GLUCOSE 90 12/02/2020  ? CHOL 179 12/02/2020  ? TRIG 93 12/02/2020  ? HDL 53 12/02/2020  ? LDLCALC 109 (H) 12/02/2020  ? ALT 23 12/02/2020  ? AST 21 12/02/2020  ? NA 143 12/02/2020  ? K 4.4 12/02/2020  ? CL 103 12/02/2020  ? CREATININE 0.70 12/02/2020  ? BUN 16 12/02/2020  ? CO2 25 12/02/2020  ? TSH 1.620 12/02/2020  ? INR 0.95 12/17/2010  ? ? ? ?Assessment & Plan:  ? ?Problem List Items Addressed This Visit   ? ?  ? Other  ? Respiratory symptoms - Primary  ?  Respiratory infection versus allergies.  Continue Zyrtec.  Atrovent as prescribed.  Promethazine DM for cough.  If fails to improve or worsens, she can start Doxy. ?  ?  ? ? ?Meds ordered this encounter  ?Medications  ? ipratropium (ATROVENT) 0.06 % nasal spray  ?  Sig: Place 2 sprays into both nostrils 4 (four) times daily as needed for rhinitis.  ?  Dispense:  15 mL  ?  Refill:  0  ? promethazine-dextromethorphan (PROMETHAZINE-DM) 6.25-15 MG/5ML syrup  ?  Sig: Take 5 mLs by mouth 4 (four) times daily as needed for cough.  ?  Dispense:  118 mL  ?  Refill:  0  ? doxycycline (VIBRA-TABS) 100 MG tablet  ?  Sig: Take 1 tablet (100 mg total) by mouth 2 (two) times daily.  ?  Dispense:  14 tablet  ?  Refill:  0  ? albuterol (VENTOLIN HFA) 108 (90 Base) MCG/ACT inhaler  ?  Sig: Inhale 2 puffs into the lungs every 6 (six) hours as needed for wheezing or shortness of breath.  ?  Dispense:  18 g  ?  Refill:  0  ? ?Thersa Salt DO ?[place] ? ?

## 2021-09-03 NOTE — Assessment & Plan Note (Signed)
Respiratory infection versus allergies.  Continue Zyrtec.  Atrovent as prescribed.  Promethazine DM for cough.  If fails to improve or worsens, she can start Doxy. ?

## 2021-09-03 NOTE — Patient Instructions (Signed)
Rest. Fluids. ? ?Continue the Zyrtec. ? ?Medications as prescribed. ? ?Take care ? ?Dr. Lacinda Axon  ?

## 2021-10-05 ENCOUNTER — Ambulatory Visit: Payer: Medicare HMO | Admitting: Family Medicine

## 2021-11-03 ENCOUNTER — Other Ambulatory Visit: Payer: Self-pay | Admitting: Family Medicine

## 2021-11-03 DIAGNOSIS — I10 Essential (primary) hypertension: Secondary | ICD-10-CM

## 2021-11-05 ENCOUNTER — Ambulatory Visit (INDEPENDENT_AMBULATORY_CARE_PROVIDER_SITE_OTHER): Payer: Medicare HMO | Admitting: Family Medicine

## 2021-11-05 ENCOUNTER — Encounter: Payer: Self-pay | Admitting: Family Medicine

## 2021-11-05 VITALS — BP 136/80 | HR 62 | Temp 97.6°F | Wt 187.8 lb

## 2021-11-05 DIAGNOSIS — E7800 Pure hypercholesterolemia, unspecified: Secondary | ICD-10-CM

## 2021-11-05 DIAGNOSIS — R69 Illness, unspecified: Secondary | ICD-10-CM | POA: Diagnosis not present

## 2021-11-05 DIAGNOSIS — F418 Other specified anxiety disorders: Secondary | ICD-10-CM

## 2021-11-05 DIAGNOSIS — I10 Essential (primary) hypertension: Secondary | ICD-10-CM

## 2021-11-05 DIAGNOSIS — Z130 Encounter for screening for diseases of the blood and blood-forming organs and certain disorders involving the immune mechanism: Secondary | ICD-10-CM

## 2021-11-05 DIAGNOSIS — M810 Age-related osteoporosis without current pathological fracture: Secondary | ICD-10-CM | POA: Diagnosis not present

## 2021-11-05 NOTE — Assessment & Plan Note (Signed)
Stable on Celexa.

## 2021-11-05 NOTE — Patient Instructions (Signed)
Labs today.  I have ordered a bone density test.  Go ahead and stop the Fosamax.  Follow up in 6 months.  Take care  Dr. Lacinda Axon

## 2021-11-05 NOTE — Assessment & Plan Note (Signed)
Assessing with labs today.

## 2021-11-05 NOTE — Assessment & Plan Note (Signed)
Stable.  Continue indapamide and lisinopril.  Labs today.

## 2021-11-05 NOTE — Progress Notes (Signed)
Subjective:  Patient ID: Michelle Landry, female    DOB: May 01, 1956  Age: 66 y.o. MRN: [id number]  CC: Chief Complaint  Patient presents with   Hypertension    Pt would like ears to be checked to see if there is any wax in them.     HPI:  66 year old female with hypertension, osteoporosis, osteoarthritis, hyperlipidemia, depression and anxiety presents for follow-up.  Patient states that she is doing well.  She has had some decreased hearing in her left ear.  She would like me to examine her ear today.  Patient has been on Fosamax for greater than 10 years.  Needs DEXA scan.  Hypertension is stable on indapamide and lisinopril.  Patient is in need of labs.  Last LDL was 109.  No pharmacotherapy for lipids at this time.  Patient Active Problem List   Diagnosis Date Noted   Hyperlipidemia 05/07/2021   Osteoarthritis 10/30/2015   Osteoporosis 12/03/2012   Hypertension 12/03/2012   Depression with anxiety 12/03/2012    Social Hx   Social History   Socioeconomic History   Marital status: Single    Spouse name: Not on file   Number of children: Not on file   Years of education: Not on file   Highest education level: Not on file  Occupational History   Not on file  Tobacco Use   Smoking status: Never   Smokeless tobacco: Never  Vaping Use   Vaping Use: Never used  Substance and Sexual Activity   Alcohol use: No    Alcohol/week: 0.0 standard drinks   Drug use: No   Sexual activity: Never    Birth control/protection: Abstinence  Other Topics Concern   Not on file  Social History Narrative   Lives alone, has boston terrier named Alden.   Social Determinants of Health   Financial Resource Strain: Low Risk    Difficulty of Paying Living Expenses: Not hard at all  Food Insecurity: No Food Insecurity   Worried About Charity fundraiser in the Last Year: Never true   [place] in the Last Year: Never true  Transportation Needs: No  Transportation Needs   Lack of Transportation (Medical): No   Lack of Transportation (Non-Medical): No  Physical Activity: Sufficiently Active   Days of Exercise per Week: 5 days   Minutes of Exercise per Session: 30 min  Stress: No Stress Concern Present   Feeling of Stress : Not at all  Social Connections: Moderately Integrated   Frequency of Communication with Friends and Family: More than three times a week   Frequency of Social Gatherings with Friends and Family: More than three times a week   Attends Religious Services: More than 4 times per year   Active Member of [employer] or Organizations: Yes   Attends Music therapist: More than 4 times per year   Marital Status: Never married    Review of Systems  Constitutional: Negative.   HENT:         Decreased hearing, left ear  Respiratory: Negative.    Cardiovascular: Negative.     Objective:  BP 136/80   Pulse 62   Temp 97.6 F (36.4 C)   Wt 187 lb 12.8 oz (85.2 kg)   SpO2 95%   BMI 30.31 kg/m      11/05/2021    8:18 AM 09/03/2021    2:17 PM 06/08/2021   11:52 PM  BP/Weight  Systolic BP 537 482 707  Diastolic BP  80 79 61  Wt. (Lbs) 187.8 182.2   BMI 30.31 kg/m2 29.41 kg/m2     Physical Exam Vitals and nursing note reviewed.  Constitutional:      General: She is not in acute distress.    Appearance: Normal appearance.  HENT:     Head: Normocephalic and atraumatic.  Eyes:     General:        Right eye: No discharge.        Left eye: No discharge.     Conjunctiva/sclera: Conjunctivae normal.  Cardiovascular:     Rate and Rhythm: Normal rate and regular rhythm.  Pulmonary:     Effort: Pulmonary effort is normal.     Breath sounds: Normal breath sounds. No wheezing, rhonchi or rales.  Abdominal:     General: There is no distension.     Palpations: Abdomen is soft.     Tenderness: There is no abdominal tenderness.  Neurological:     Mental Status: She is alert.  Psychiatric:        Mood and  Affect: Mood normal.        Behavior: Behavior normal.    Lab Results  Component Value Date   WBC 4.5 12/02/2020   HGB 13.4 12/02/2020   HCT 41.3 12/02/2020   PLT 212 12/02/2020   GLUCOSE 90 12/02/2020   CHOL 179 12/02/2020   TRIG 93 12/02/2020   HDL 53 12/02/2020   LDLCALC 109 (H) 12/02/2020   ALT 23 12/02/2020   AST 21 12/02/2020   NA 143 12/02/2020   K 4.4 12/02/2020   CL 103 12/02/2020   CREATININE 0.70 12/02/2020   BUN 16 12/02/2020   CO2 25 12/02/2020   TSH 1.620 12/02/2020   INR 0.95 12/17/2010     Assessment & Plan:   Problem List Items Addressed This Visit       Cardiovascular and Mediastinum   Hypertension    Stable.  Continue indapamide and lisinopril.  Labs today.       Relevant Orders   CMP14+EGFR     Musculoskeletal and Integument   Osteoporosis - Primary    DEXA scan ordered.  Stopping Fosamax given duration of treatment.       Relevant Orders   DG Bone Density   Vitamin D (25 hydroxy)     Other   Hyperlipidemia    Assessing with labs today.       Relevant Orders   Lipid panel   Depression with anxiety    Stable on Celexa.       Other Visit Diagnoses     Screening for deficiency anemia       Relevant Orders   CBC      Follow-up:  Return in about 6 months (around 05/07/2022).  [place]

## 2021-11-05 NOTE — Assessment & Plan Note (Signed)
DEXA scan ordered.  Stopping Fosamax given duration of treatment.

## 2021-11-06 LAB — CMP14+EGFR
ALT: 46 IU/L — ABNORMAL HIGH (ref 0–32)
AST: 34 IU/L (ref 0–40)
Albumin/Globulin Ratio: 1.8 (ref 1.2–2.2)
Albumin: 4.6 g/dL (ref 3.8–4.8)
Alkaline Phosphatase: 95 IU/L (ref 44–121)
BUN/Creatinine Ratio: 19 (ref 12–28)
BUN: 13 mg/dL (ref 8–27)
Bilirubin Total: 0.3 mg/dL (ref 0.0–1.2)
CO2: 28 mmol/L (ref 20–29)
Calcium: 10.2 mg/dL (ref 8.7–10.3)
Chloride: 103 mmol/L (ref 96–106)
Creatinine, Ser: 0.69 mg/dL (ref 0.57–1.00)
Globulin, Total: 2.5 g/dL (ref 1.5–4.5)
Glucose: 103 mg/dL — ABNORMAL HIGH (ref 70–99)
Potassium: 4.4 mmol/L (ref 3.5–5.2)
Sodium: 143 mmol/L (ref 134–144)
Total Protein: 7.1 g/dL (ref 6.0–8.5)
eGFR: 95 mL/min/{1.73_m2} (ref >59)

## 2021-11-06 LAB — CBC
Hematocrit: 44.1 % (ref 34.0–46.6)
Hemoglobin: 14.4 g/dL (ref 11.1–15.9)
MCH: 29.3 pg (ref 26.6–33.0)
MCHC: 32.7 g/dL (ref 31.5–35.7)
MCV: 90 fL (ref 79–97)
Platelets: 228 10*3/uL (ref 150–450)
RBC: 4.91 x10E6/uL (ref 3.77–5.28)
RDW: 13.6 % (ref 11.7–15.4)
WBC: 4.7 10*3/uL (ref 3.4–10.8)

## 2021-11-06 LAB — LIPID PANEL
Chol/HDL Ratio: 3.6 ratio (ref 0.0–4.4)
Cholesterol, Total: 182 mg/dL (ref 100–199)
HDL: 51 mg/dL (ref >39)
LDL Chol Calc (NIH): 111 mg/dL — ABNORMAL HIGH (ref 0–99)
Triglycerides: 112 mg/dL (ref 0–149)
VLDL Cholesterol Cal: 20 mg/dL (ref 5–40)

## 2021-11-06 LAB — VITAMIN D 25 HYDROXY (VIT D DEFICIENCY, FRACTURES): Vit D, 25-Hydroxy: 46.1 ng/mL (ref 30.0–100.0)

## 2021-11-10 ENCOUNTER — Other Ambulatory Visit: Payer: Self-pay | Admitting: Family Medicine

## 2021-11-10 MED ORDER — ROSUVASTATIN CALCIUM 10 MG PO TABS: 10.0000 mg | ORAL_TABLET | Freq: Every day | ORAL | 3 refills | Status: DC

## 2021-11-12 ENCOUNTER — Other Ambulatory Visit (HOSPITAL_COMMUNITY): Payer: Self-pay | Admitting: Family Medicine

## 2021-11-12 DIAGNOSIS — Z1231 Encounter for screening mammogram for malignant neoplasm of breast: Secondary | ICD-10-CM

## 2021-11-26 ENCOUNTER — Other Ambulatory Visit: Payer: Self-pay | Admitting: Family Medicine

## 2021-11-26 DIAGNOSIS — F418 Other specified anxiety disorders: Secondary | ICD-10-CM

## 2021-12-24 ENCOUNTER — Ambulatory Visit (INDEPENDENT_AMBULATORY_CARE_PROVIDER_SITE_OTHER): Payer: Medicare HMO | Admitting: Nurse Practitioner

## 2021-12-24 ENCOUNTER — Encounter: Payer: Self-pay | Admitting: Nurse Practitioner

## 2021-12-24 ENCOUNTER — Ambulatory Visit (HOSPITAL_COMMUNITY)
Admission: RE | Admit: 2021-12-24 | Discharge: 2021-12-24 | Disposition: A | Discharge status: Home / Self Care | Payer: Medicare HMO | Source: Ambulatory Visit | Attending: Nurse Practitioner | Admitting: Nurse Practitioner

## 2021-12-24 VITALS — BP 140/74 | HR 68 | Temp 98.6°F | Ht 66.0 in | Wt 190.0 lb

## 2021-12-24 DIAGNOSIS — M25571 Pain in right ankle and joints of right foot: Secondary | ICD-10-CM | POA: Diagnosis not present

## 2021-12-24 DIAGNOSIS — M7731 Calcaneal spur, right foot: Secondary | ICD-10-CM | POA: Diagnosis not present

## 2021-12-24 NOTE — Progress Notes (Signed)
   Subjective:    Patient ID: Michelle Landry, female    DOB: 10-01-1955, 66 y.o.   MRN: [id number]  HPI  66 year old female patient with history of osteoporosis and osteopenia presents to clinic today for swollen, painful right ankle.  Patient states that the pain started last week after doing yard work.  Patient denies any injuries to the area or tripping or twisting her ankle.  Patient states that the pain hurts near the lateral aspect of her ankle.   Review of Systems  Musculoskeletal:        Right ankle pain  All other systems reviewed and are negative.      Objective:   Physical Exam Vitals reviewed.  Constitutional:      General: She is not in acute distress.    Appearance: Normal appearance. She is obese. She is not ill-appearing, toxic-appearing or diaphoretic.  HENT:     Head: Normocephalic and atraumatic.  Musculoskeletal:     Right ankle: Swelling present. No deformity, ecchymosis or lacerations. Tenderness present over the lateral malleolus. No medial malleolus, ATF ligament, AITF ligament, CF ligament, posterior TF ligament, base of 5th metatarsal or proximal fibula tenderness. Decreased range of motion. Anterior drawer test negative. Normal pulse.     Left ankle: Normal.     Comments: Swelling noted to right ankle.  Point tenderness noted to lateral malleolus  Neurological:     Mental Status: She is alert.  Psychiatric:        Mood and Affect: Mood normal.        Behavior: Behavior normal.         Assessment & Plan:   1. Acute right ankle pain -Due to patient's history of osteoporosis concern for hairline fracture -We will have patient x-ray her foot for evaluation of fracture. -Continue taking ibuprofen as needed for pain -If x-ray negative for fracture encourage patient to rest, ice, use Ace bandage or ankle brace, and elevate her right ankle.  Patient stated understanding - DG Foot Complete Right -Return to clinic if symptoms continue or persist.     Note:  This document was prepared using Dragon voice recognition software and may include unintentional dictation errors. Note - This record has been created using [employer].  Chart creation errors have been sought, but may not always  have been located. Such creation errors do not reflect on  the standard of medical care.

## 2021-12-28 ENCOUNTER — Ambulatory Visit (HOSPITAL_COMMUNITY)
Admission: RE | Admit: 2021-12-28 | Discharge: 2021-12-28 | Disposition: A | Discharge status: Home / Self Care | Payer: Medicare HMO | Source: Ambulatory Visit | Attending: Family Medicine | Admitting: Family Medicine

## 2021-12-28 DIAGNOSIS — Z780 Asymptomatic menopausal state: Secondary | ICD-10-CM | POA: Diagnosis not present

## 2021-12-28 DIAGNOSIS — M810 Age-related osteoporosis without current pathological fracture: Secondary | ICD-10-CM | POA: Diagnosis not present

## 2021-12-28 DIAGNOSIS — Z1231 Encounter for screening mammogram for malignant neoplasm of breast: Secondary | ICD-10-CM | POA: Diagnosis not present

## 2021-12-28 DIAGNOSIS — M85851 Other specified disorders of bone density and structure, right thigh: Secondary | ICD-10-CM | POA: Diagnosis not present

## 2022-01-30 ENCOUNTER — Other Ambulatory Visit: Payer: Self-pay | Admitting: Family Medicine

## 2022-01-30 DIAGNOSIS — I10 Essential (primary) hypertension: Secondary | ICD-10-CM

## 2022-02-27 ENCOUNTER — Other Ambulatory Visit: Payer: Self-pay | Admitting: Family Medicine

## 2022-02-27 DIAGNOSIS — F418 Other specified anxiety disorders: Secondary | ICD-10-CM

## 2022-03-26 ENCOUNTER — Encounter: Payer: Self-pay | Admitting: Family Medicine

## 2022-03-26 ENCOUNTER — Ambulatory Visit (INDEPENDENT_AMBULATORY_CARE_PROVIDER_SITE_OTHER): Payer: Medicare HMO | Admitting: Family Medicine

## 2022-03-26 VITALS — BP 145/71 | HR 56 | Temp 97.0°F | Wt 188.4 lb

## 2022-03-26 DIAGNOSIS — R300 Dysuria: Secondary | ICD-10-CM | POA: Insufficient documentation

## 2022-03-26 DIAGNOSIS — R350 Frequency of micturition: Secondary | ICD-10-CM | POA: Diagnosis not present

## 2022-03-26 LAB — POCT URINALYSIS DIP (CLINITEK)
Spec Grav, UA: 1.015 (ref 1.010–1.025)
pH, UA: 6.5 (ref 5.0–8.0)

## 2022-03-26 MED ORDER — CEPHALEXIN 500 MG PO CAPS: 500.0000 mg | ORAL_CAPSULE | Freq: Two times a day (BID) | ORAL | 0 refills | Status: DC

## 2022-03-26 NOTE — Progress Notes (Signed)
Subjective:  Patient ID: Michelle Landry, female    DOB: 12/29/1955  Age: 66 y.o. MRN: [id number]  CC: Chief Complaint  Patient presents with   Urinary Tract Infection    Burning and frequency. Feels like she needs to go but when she does, nothing happens.     HPI:  66 year old female presents for evaluation of the above.  Reports that she has had symptoms for the past week. Reports dysuria and frequency. No flank pain. No fever. No abdominal pain. No relieving factors. No other complaints.   Patient Active Problem List   Diagnosis Date Noted   Dysuria 03/26/2022   Hyperlipidemia 05/07/2021   Osteoarthritis 10/30/2015   Osteoporosis 12/03/2012   Hypertension 12/03/2012   Depression with anxiety 12/03/2012    Social Hx   Social History   Socioeconomic History   Marital status: Single    Spouse name: Not on file   Number of children: Not on file   Years of education: Not on file   Highest education level: Not on file  Occupational History   Not on file  Tobacco Use   Smoking status: Never   Smokeless tobacco: Never  Vaping Use   Vaping Use: Never used  Substance and Sexual Activity   Alcohol use: No    Alcohol/week: 0.0 standard drinks of alcohol   Drug use: No   Sexual activity: Never    Birth control/protection: Abstinence  Other Topics Concern   Not on file  Social History Narrative   Lives alone, has boston terrier named Loomis.   Social Determinants of Health   Financial Resource Strain: Low Risk  (06/02/2021)   Overall Financial Resource Strain (CARDIA)    Difficulty of Paying Living Expenses: Not hard at all  Food Insecurity: No Food Insecurity (06/02/2021)   Hunger Vital Sign    Worried About Running Out of Food in the Last Year: Never true    Ran Out of Food in the Last Year: Never true  Transportation Needs: No Transportation Needs (06/02/2021)   PRAPARE - Hydrologist (Medical): No    Lack of Transportation  (Non-Medical): No  Physical Activity: Sufficiently Active (06/02/2021)   Exercise Vital Sign    Days of Exercise per Week: 5 days    Minutes of Exercise per Session: 30 min  Stress: No Stress Concern Present (06/02/2021)   [place]    Feeling of Stress : Not at all  Social Connections: Moderately Integrated (06/02/2021)   Social Connection and Isolation Panel [NHANES]    Frequency of Communication with Friends and Family: More than three times a week    Frequency of Social Gatherings with Friends and Family: More than three times a week    Attends Religious Services: More than 4 times per year    Active Member of [employer] or Organizations: Yes    Attends Music therapist: More than 4 times per year    Marital Status: Never married    Review of Systems Per HPI  Objective:  BP (!) 145/71   Pulse (!) 56   Temp (!) 97 F (36.1 C)   Wt 188 lb 6.4 oz (85.5 kg)   SpO2 97%   BMI 30.41 kg/m      03/26/2022    4:12 PM 03/26/2022    4:06 PM 12/24/2021    2:50 PM  BP/Weight  Systolic BP 480 165 537  Diastolic BP  71 74 74  Wt. (Lbs)  188.4 190  BMI  30.41 kg/m2 30.67 kg/m2    Physical Exam Vitals and nursing note reviewed.  Constitutional:      General: She is not in acute distress.    Appearance: Normal appearance.  HENT:     Head: Normocephalic and atraumatic.  Eyes:     General:        Right eye: No discharge.        Left eye: No discharge.     Conjunctiva/sclera: Conjunctivae normal.  Cardiovascular:     Rate and Rhythm: Normal rate and regular rhythm.  Pulmonary:     Effort: Pulmonary effort is normal.     Breath sounds: Normal breath sounds. No wheezing, rhonchi or rales.  Abdominal:     General: There is no distension.     Palpations: Abdomen is soft.     Tenderness: There is no abdominal tenderness. There is no right CVA tenderness or left CVA tenderness.  Neurological:     Mental  Status: She is alert.  Psychiatric:        Mood and Affect: Mood normal.        Behavior: Behavior normal.     Lab Results  Component Value Date   WBC 4.7 11/05/2021   HGB 14.4 11/05/2021   HCT 44.1 11/05/2021   PLT 228 11/05/2021   GLUCOSE 103 (H) 11/05/2021   CHOL 182 11/05/2021   TRIG 112 11/05/2021   HDL 51 11/05/2021   LDLCALC 111 (H) 11/05/2021   ALT 46 (H) 11/05/2021   AST 34 11/05/2021   NA 143 11/05/2021   K 4.4 11/05/2021   CL 103 11/05/2021   CREATININE 0.69 11/05/2021   BUN 13 11/05/2021   CO2 28 11/05/2021   TSH 1.620 12/02/2020   INR 0.95 12/17/2010     Assessment & Plan:   Problem List Items Addressed This Visit       Other   Dysuria - Primary    UA with protein. Given history, I am concerned for UTI (despite no [place]). Sending culture. Starting on Keflex while awaiting culture.       Relevant Orders   POCT URINALYSIS DIP (CLINITEK) (Completed)   Urine Culture    Meds ordered this encounter  Medications   cephALEXin (KEFLEX) 500 MG capsule    Sig: Take 1 capsule (500 mg total) by mouth 2 (two) times daily.    Dispense:  14 capsule    Refill:  0    Follow-up:  Return if symptoms worsen or fail to improve.  [place]

## 2022-03-26 NOTE — Patient Instructions (Signed)
Plenty of fluids.  Antibiotic as prescribed.  Take care  Dr. Lacinda Axon

## 2022-03-26 NOTE — Assessment & Plan Note (Signed)
UA with protein. Given history, I am concerned for UTI (despite no [place]uks). Sending culture. Starting on Keflex while awaiting culture.

## 2022-03-30 LAB — URINE CULTURE

## 2022-04-28 ENCOUNTER — Emergency Department (HOSPITAL_COMMUNITY)
Admission: EM | Admit: 2022-04-28 | Discharge: 2022-04-28 | Disposition: A | Discharge status: Home / Self Care | Payer: Medicare HMO | Attending: Emergency Medicine | Admitting: Emergency Medicine

## 2022-04-28 ENCOUNTER — Other Ambulatory Visit: Payer: Self-pay

## 2022-04-28 ENCOUNTER — Encounter (HOSPITAL_COMMUNITY): Payer: Self-pay | Admitting: Emergency Medicine

## 2022-04-28 DIAGNOSIS — M545 Low back pain, unspecified: Secondary | ICD-10-CM | POA: Insufficient documentation

## 2022-04-28 DIAGNOSIS — R109 Unspecified abdominal pain: Secondary | ICD-10-CM | POA: Insufficient documentation

## 2022-04-28 DIAGNOSIS — R350 Frequency of micturition: Secondary | ICD-10-CM | POA: Insufficient documentation

## 2022-04-28 DIAGNOSIS — R3915 Urgency of urination: Secondary | ICD-10-CM | POA: Insufficient documentation

## 2022-04-28 LAB — BASIC METABOLIC PANEL
Anion gap: 9 (ref 5–15)
BUN: 15 mg/dL (ref 8–23)
CO2: 24 mmol/L (ref 22–32)
Calcium: 9.2 mg/dL (ref 8.9–10.3)
Chloride: 104 mmol/L (ref 98–111)
Creatinine, Ser: 0.68 mg/dL (ref 0.44–1.00)
GFR, Estimated: >60 mL/min (ref >60)
Glucose, Bld: 121 mg/dL — ABNORMAL HIGH (ref 70–99)
Potassium: 3.4 mmol/L — ABNORMAL LOW (ref 3.5–5.1)
Sodium: 137 mmol/L (ref 135–145)

## 2022-04-28 LAB — CBC
HCT: 41.1 % (ref 36.0–46.0)
Hemoglobin: 13.3 g/dL (ref 12.0–15.0)
MCH: 28.4 pg (ref 26.0–34.0)
MCHC: 32.4 g/dL (ref 30.0–36.0)
MCV: 87.6 fL (ref 80.0–100.0)
Platelets: 228 10*3/uL (ref 150–400)
RBC: 4.69 MIL/uL (ref 3.87–5.11)
RDW: 13.8 % (ref 11.5–15.5)
WBC: 7.2 10*3/uL (ref 4.0–10.5)
nRBC: 0 % (ref 0.0–0.2)

## 2022-04-28 LAB — URINALYSIS, ROUTINE W REFLEX MICROSCOPIC
Bacteria, UA: NONE SEEN
Bilirubin Urine: NEGATIVE
Glucose, UA: NEGATIVE mg/dL
Ketones, ur: NEGATIVE mg/dL
Leukocytes,Ua: NEGATIVE
Nitrite: NEGATIVE
Protein, ur: 30 mg/dL — AB
Specific Gravity, Urine: 1.024 (ref 1.005–1.030)
pH: 6 (ref 5.0–8.0)

## 2022-04-28 MED ORDER — OXYCODONE-ACETAMINOPHEN 5-325 MG PO TABS
2.0000 | ORAL_TABLET | Freq: Once | ORAL | Status: AC
  Administered 2022-04-28: 2 via ORAL
  Filled 2022-04-28: qty 2

## 2022-04-28 MED ORDER — CEPHALEXIN 500 MG PO CAPS: 500.0000 mg | ORAL_CAPSULE | Freq: Three times a day (TID) | ORAL | 0 refills | Status: AC

## 2022-04-28 MED ORDER — CEPHALEXIN 500 MG PO CAPS
500.0000 mg | ORAL_CAPSULE | Freq: Once | ORAL | Status: AC
  Administered 2022-04-28: 500 mg via ORAL
  Filled 2022-04-28: qty 1

## 2022-04-28 MED ORDER — NAPROXEN 500 MG PO TABS: 500.0000 mg | ORAL_TABLET | Freq: Two times a day (BID) | ORAL | 0 refills | Status: DC

## 2022-04-28 MED ORDER — OXYCODONE-ACETAMINOPHEN 5-325 MG PO TABS
1.0000 | ORAL_TABLET | Freq: Four times a day (QID) | ORAL | 0 refills | Status: DC | PRN
Start: 2022-04-28 — End: 2022-05-21

## 2022-04-28 NOTE — ED Provider Notes (Signed)
[facility] EMERGENCY DEPARTMENT Provider Note   CSN: [id number] Arrival date & time: 04/28/22  1931     History  Chief Complaint  Patient presents with   Flank Pain    Michelle Landry is a 66 y.o. female.   Flank Pain   This patient is a 66 year old female, she presents to the hospital with a complaint of right lower back pain which started approximately 5 or 6 days ago, she has been moving furniture all week and she is having significant difficulty time moving anything around.  She has not measured a temperature but was 100.2 degrees on arrival.  She is not nauseated or vomiting but does have a feeling of some urinary frequency and urgency.  She denies any coughing shortness of breath and has no numbness or weakness though she states that the pain does seem to radiate from her right lower back down her leg somewhat.    Home Medications Prior to Admission medications   Medication Sig Start Date End Date Taking? Authorizing Provider  cephALEXin (KEFLEX) 500 MG capsule Take 1 capsule (500 mg total) by mouth 3 (three) times daily for 7 days. 04/28/22 05/05/22 Yes Noemi Chapel, MD  naproxen (NAPROSYN) 500 MG tablet Take 1 tablet (500 mg total) by mouth 2 (two) times daily with a meal. 04/28/22  Yes Noemi Chapel, MD  oxyCODONE-acetaminophen (PERCOCET/ROXICET) 5-325 MG tablet Take 1 tablet by mouth every 6 (six) hours as needed for severe pain. 04/28/22  Yes Noemi Chapel, MD  albuterol (VENTOLIN HFA) 108 (90 Base) MCG/ACT inhaler Inhale 2 puffs into the lungs every 6 (six) hours as needed for wheezing or shortness of breath. 09/03/21   Coral Spikes, DO  alendronate (FOSAMAX) 70 MG tablet TAKE 1 TABLET BY MOUTH ONCE A WEEK. TAKE WITH A FULL GLASS OF WATER ON AN EMPTY STOMACH. 05/07/21   Coral Spikes, DO  cholecalciferol (VITAMIN D) 25 MCG (1000 UNIT) tablet Take 1,000 Units by mouth daily.    [provider]  citalopram (CELEXA) 20 MG tablet TAKE 1 TABLET BY MOUTH AT BEDTIME  03/01/22   Cook, Michael [place] G, DO  ibuprofen (ADVIL) 200 MG tablet Take 400 mg by mouth every 8 (eight) hours as needed (arthritis pain.).    [provider]  indapamide (LOZOL) 1.25 MG tablet TAKE 1 TABLET BY MOUTH IN THE MORNING FOR BLOOD PRESSURE AND FOR FLUID 02/01/22   Thersa Salt G, DO  ipratropium (ATROVENT) 0.06 % nasal spray Place 2 sprays into both nostrils 4 (four) times daily as needed for rhinitis. 09/03/21   Coral Spikes, DO  lisinopril (ZESTRIL) 20 MG tablet TAKE 1 TABLET BY MOUTH ONCE DAILY IN THE MORNING 02/01/22   Coral Spikes, DO  meclizine (ANTIVERT) 25 MG tablet Take 1 tablet (25 mg total) by mouth 2 (two) times daily as needed for dizziness. 12/02/20   Erven Colla, DO  Multiple Vitamin (MULTIVITAMIN WITH MINERALS) TABS tablet Take 1 tablet by mouth daily.    [provider]  potassium chloride (KLOR-CON) 10 MEQ tablet Take 1 tablet by mouth once daily 02/01/22   Thersa Salt G, DO  rosuvastatin (CRESTOR) 10 MG tablet Take 1 tablet (10 mg total) by mouth daily. 11/10/21   Coral Spikes, DO  vitamin C (ASCORBIC ACID) 500 MG tablet Take 500 mg by mouth daily.    [provider]      Allergies    Sulfa antibiotics    Review of Systems   Review  of Systems  Genitourinary:  Positive for flank pain.  All other systems reviewed and are negative.   Physical Exam Updated Vital Signs BP (!) 157/78   Pulse 77   Temp 100.2 F (37.9 C) (Oral)   Resp 17   Ht 1.676 m ('5\' 6"'$ )   Wt 83.5 kg   SpO2 96%   BMI 29.70 kg/m  Physical Exam Vitals and nursing note reviewed.  Constitutional:      General: She is not in acute distress.    Appearance: She is well-developed.  HENT:     Head: Normocephalic and atraumatic.     Mouth/Throat:     Pharynx: No oropharyngeal exudate.  Eyes:     General: No scleral icterus.       Right eye: No discharge.        Left eye: No discharge.     Conjunctiva/sclera: Conjunctivae normal.     Pupils: Pupils are equal, round,  and reactive to light.  Neck:     Thyroid: No thyromegaly.     Vascular: No JVD.  Cardiovascular:     Rate and Rhythm: Normal rate and regular rhythm.     Heart sounds: Normal heart sounds. No murmur heard.    No friction rub. No gallop.  Pulmonary:     Effort: Pulmonary effort is normal. No respiratory distress.     Breath sounds: Normal breath sounds. No wheezing or rales.  Abdominal:     General: Bowel sounds are normal. There is no distension.     Palpations: Abdomen is soft. There is no mass.     Tenderness: There is no abdominal tenderness.  Musculoskeletal:        General: Tenderness present. Normal range of motion.     Cervical back: Normal range of motion and neck supple.     Right lower leg: No edema.     Left lower leg: No edema.     Comments: There is tenderness to palpation in the right flank and right lower back  Lymphadenopathy:     Cervical: No cervical adenopathy.  Skin:    General: Skin is warm and dry.     Findings: No erythema or rash.  Neurological:     Mental Status: She is alert.     Coordination: Coordination normal.     Comments: The patient has pain with movement up and down in the bed and changing position, she seems more comfortable when she is resting.  She has totally normal neurologic sensation and strength to the right and left lower extremities.  Psychiatric:        Behavior: Behavior normal.     ED Results / Procedures / Treatments   Labs (all labs ordered are listed, but only abnormal results are displayed) Labs Reviewed  URINALYSIS, ROUTINE W REFLEX MICROSCOPIC - Abnormal; Notable for the following components:      Result Value   Hgb urine dipstick SMALL (*)    Protein, ur 30 (*)    All other components within normal limits  BASIC METABOLIC PANEL - Abnormal; Notable for the following components:   Potassium 3.4 (*)    Glucose, Bld 121 (*)    All other components within normal limits  URINE CULTURE  CBC     EKG None  Radiology No results found.  Procedures Procedures    Medications Ordered in ED Medications  cephALEXin (KEFLEX) capsule 500 mg (has no administration in time range)  oxyCODONE-acetaminophen (PERCOCET/ROXICET) 5-325 MG per tablet 2 tablet (  2 tablets Oral Given 04/28/22 2248)    ED Course/ Medical Decision Making/ A&P                           Medical Decision Making Amount and/or Complexity of Data Reviewed Labs: ordered.  Risk Prescription drug management.   At this time the patient will be evaluated for different causes of her pain, this could be musculoskeletal, would also consider that this could be related to a urinary cause such as pyelonephritis.  She does have a low-grade fever but she is not tachycardic and has no leukocytosis.  The patient has given a urine sample, we will give her something for pain while we wait.  Labs: I personally viewed and interpreted labs which show no leukocytosis, no significant renal dysfunction, urinalysis with minimal signs of UTI.  The patient is not tachycardic, she is normotensive, she was given pain medication here and feels like she has better.  She will be given a prescription for cephalexin, she is agreeable to the plan and stable for discharge.  She does not have any neurologic symptoms to suggest that this is spinal in origin, she has absolutely no abdominal tenderness to suggest that this right-sided discomfort is related to her gallbladder, she does not feel like she has a fever and is not septic appearing at all.  Urine culture sent  Patient agreeable to discharge and follow-up        Final Clinical Impression(s) / ED Diagnoses Final diagnoses:  Right flank pain    Rx / DC Orders ED Discharge Orders          Ordered    cephALEXin (KEFLEX) 500 MG capsule  3 times daily        04/28/22 2318    naproxen (NAPROSYN) 500 MG tablet  2 times daily with meals        04/28/22 2318     oxyCODONE-acetaminophen (PERCOCET/ROXICET) 5-325 MG tablet  Every 6 hours PRN        04/28/22 2318              Noemi Chapel, MD 04/28/22 2320

## 2022-04-28 NOTE — ED Triage Notes (Signed)
Pt c/o right flank pain since Friday.

## 2022-04-28 NOTE — Discharge Instructions (Signed)
Your testing showed mild sign of a urinary infection I suspect that your pain is related somewhat to the muscles in your back - likely from moving furniture this week - it may be related to some infection around your kidney.    Thankfully your blood work was unremarkable.  I do want you to take medications including Tylenol or ibuprofen for fever or pain.  I have also prescribed an antibiotic called cephalexin, an anti-inflammatory called naproxen which you can take twice a day and for severe pain you can take oxycodone with acetaminophen which is also called Percocet 1 tablet every 6 hours as needed.  Thank you for allowing [place] to treat you in the emergency department today.  After reviewing your examination and potential testing that was done it appears that you are safe to go home.  I would like for you to follow-up with your doctor within the next several days, have them obtain your results and follow-up with them to review all of these tests.  If you should develop severe or worsening symptoms return to the emergency department immediately

## 2022-05-01 LAB — URINE CULTURE: Culture: 20000 — AB

## 2022-05-02 ENCOUNTER — Other Ambulatory Visit: Payer: Self-pay | Admitting: Family Medicine

## 2022-05-02 ENCOUNTER — Telehealth (HOSPITAL_BASED_OUTPATIENT_CLINIC_OR_DEPARTMENT_OTHER): Payer: Self-pay | Admitting: Emergency Medicine

## 2022-05-02 DIAGNOSIS — F418 Other specified anxiety disorders: Secondary | ICD-10-CM

## 2022-05-02 DIAGNOSIS — I10 Essential (primary) hypertension: Secondary | ICD-10-CM

## 2022-05-02 NOTE — Telephone Encounter (Signed)
Post ED Visit - Positive Culture Follow-up: Successful Patient Follow-Up  Culture assessed and recommendations reviewed by:  '[]'$  Elenor Quinones, Pharm.D. '[]'$  Heide Guile, Pharm.D., BCPS AQ-ID '[]'$  Parks Neptune, Pharm.D., BCPS '[]'$  Alycia Rossetti, Pharm.D., BCPS '[]'$  Rand, Pharm.D., BCPS, AAHIVP '[]'$  Legrand Como, Pharm.D., BCPS, AAHIVP '[]'$  Salome Arnt, PharmD, BCPS '[]'$  Johnnette Gourd, PharmD, BCPS '[]'$  Hughes Better, PharmD, BCPS '[x]'$  Wilson Singer, PharmD  Positive urine culture  '[]'$  Patient discharged without antimicrobial prescription and treatment is now indicated '[x]'$  Organism is resistant to prescribed ED discharge antimicrobial '[]'$  Patient with positive blood cultures  Changes discussed with ED provider: Garnette Gunner, MD New antibiotic prescription Amoxicillin 500 mg TID for seven days Called to [facility] [phone]  Contacted patient, date 05/02/22, time [place] 05/02/2022, 10:37 PM

## 2022-05-07 ENCOUNTER — Ambulatory Visit (INDEPENDENT_AMBULATORY_CARE_PROVIDER_SITE_OTHER): Payer: Medicare HMO | Admitting: Family Medicine

## 2022-05-07 VITALS — BP 162/80 | HR 60 | Temp 98.0°F | Wt 187.2 lb

## 2022-05-07 DIAGNOSIS — N390 Urinary tract infection, site not specified: Secondary | ICD-10-CM | POA: Diagnosis not present

## 2022-05-07 DIAGNOSIS — R300 Dysuria: Secondary | ICD-10-CM | POA: Diagnosis not present

## 2022-05-07 DIAGNOSIS — R319 Hematuria, unspecified: Secondary | ICD-10-CM | POA: Diagnosis not present

## 2022-05-07 DIAGNOSIS — R011 Cardiac murmur, unspecified: Secondary | ICD-10-CM

## 2022-05-07 LAB — POCT URINALYSIS DIP (CLINITEK)
Bilirubin, UA: NEGATIVE
Glucose, UA: NEGATIVE mg/dL
Leukocytes, UA: NEGATIVE
Spec Grav, UA: >=1.03 — AB (ref 1.010–1.025)
pH, UA: 6.5 (ref 5.0–8.0)

## 2022-05-07 NOTE — Assessment & Plan Note (Signed)
Sending urine for culture to ensure eradication of bacteria.  Complete course of amoxicillin.

## 2022-05-07 NOTE — Patient Instructions (Addendum)
You will get a call about scheduling the Echo (I don't feel that you have anything to be worried about but we will check it out to be cautious).  Continue your current medications.  Follow up in 6 months.

## 2022-05-07 NOTE — Assessment & Plan Note (Signed)
Soft murmur noted at the right upper sternal border.  This is likely benign.  However, I am arranging echocardiogram for further evaluation.

## 2022-05-07 NOTE — Progress Notes (Signed)
Subjective:  Patient ID: Michelle Landry, female    DOB: [birth year]  Age: 66 y.o. MRN: [id number]  CC: Chief Complaint  Patient presents with   Follow-up    Follow ED visit visit for Kidney infection    HPI:  66 year old female with HTN, HLD, OA, Osteoporosis, Depression and Anxiety presents for follow up.  Recently seen in the ER on 11/22.  Concern for pyelonephritis.  Urine culture revealed 20,000 colony-forming units of Enterococcus.  Patient was placed on Keflex empirically and this was subsequently changed to amoxicillin.  Patient states that she is having some low back pain currently but no urinary symptoms.  BP elevated here today.  She is compliant with lisinopril and indapamide.  Blood pressure normally well-controlled.  This is possibly related to ongoing back pain.  Patient reports that the ER commented about murmur and asked if she had been seeing a cardiologist.  Patient would like to discuss this today.  Patient Active Problem List   Diagnosis Date Noted   Murmur 05/07/2022   Urinary tract infection with hematuria 05/07/2022   Hyperlipidemia 05/07/2021   Osteoarthritis 10/30/2015   Osteoporosis 12/03/2012   Hypertension 12/03/2012   Depression with anxiety 12/03/2012    Social Hx   Social History   Socioeconomic History   Marital status: Single    Spouse name: Not on file   Number of children: Not on file   Years of education: Not on file   Highest education level: Not on file  Occupational History   Not on file  Tobacco Use   Smoking status: Never   Smokeless tobacco: Never  Vaping Use   Vaping Use: Never used  Substance and Sexual Activity   Alcohol use: No    Alcohol/week: 0.0 standard drinks of alcohol   Drug use: No   Sexual activity: Never    Birth control/protection: Abstinence  Other Topics Concern   Not on file  Social History Narrative   Lives alone, has boston terrier named Sanderson.   Social Determinants of Health   Financial  Resource Strain: Low Risk  (06/02/2021)   Overall Financial Resource Strain (CARDIA)    Difficulty of Paying Living Expenses: Not hard at all  Food Insecurity: No Food Insecurity (06/02/2021)   Hunger Vital Sign    Worried About Running Out of Food in the Last Year: Never true    Ran Out of Food in the Last Year: Never true  Transportation Needs: No Transportation Needs (06/02/2021)   PRAPARE - Hydrologist (Medical): No    Lack of Transportation (Non-Medical): No  Physical Activity: Sufficiently Active (06/02/2021)   Exercise Vital Sign    Days of Exercise per Week: 5 days    Minutes of Exercise per Session: 30 min  Stress: No Stress Concern Present (06/02/2021)   [place]    Feeling of Stress : Not at all  Social Connections: Moderately Integrated (06/02/2021)   Social Connection and Isolation Panel [NHANES]    Frequency of Communication with Friends and Family: More than three times a week    Frequency of Social Gatherings with Friends and Family: More than three times a week    Attends Religious Services: More than 4 times per year    Active Member of [employer] or Organizations: Yes    Attends Archivist Meetings: More than 4 times per year    Marital Status: Never married  Review of Systems Per HPI  Objective:  BP (!) 162/80   Pulse 60   Temp 98 F (36.7 C) (Oral)   Wt 187 lb 3.2 oz (84.9 kg)   SpO2 98%   BMI 30.21 kg/m      05/07/2022    8:53 AM 05/07/2022    8:28 AM 05/07/2022    8:23 AM  BP/Weight  Systolic BP [phone]  Diastolic BP 80 78 80  Wt. (Lbs)   187.2  BMI   30.21 kg/m2    Physical Exam Constitutional:      General: She is not in acute distress.    Appearance: Normal appearance.  HENT:     Head: Normocephalic and atraumatic.  Cardiovascular:     Rate and Rhythm: Normal rate and regular rhythm.     Heart sounds: Murmur heard.  Pulmonary:      Effort: Pulmonary effort is normal.     Breath sounds: Normal breath sounds. No wheezing, rhonchi or rales.  Abdominal:     General: There is no distension.     Palpations: Abdomen is soft.     Tenderness: There is no abdominal tenderness.  Neurological:     Mental Status: She is alert.  Psychiatric:        Mood and Affect: Mood normal.        Behavior: Behavior normal.     Lab Results  Component Value Date   WBC 7.2 04/28/2022   HGB 13.3 04/28/2022   HCT 41.1 04/28/2022   PLT 228 04/28/2022   GLUCOSE 121 (H) 04/28/2022   CHOL 182 11/05/2021   TRIG 112 11/05/2021   HDL 51 11/05/2021   LDLCALC 111 (H) 11/05/2021   ALT 46 (H) 11/05/2021   AST 34 11/05/2021   NA 137 04/28/2022   K 3.4 (L) 04/28/2022   CL 104 04/28/2022   CREATININE 0.68 04/28/2022   BUN 15 04/28/2022   CO2 24 04/28/2022   TSH 1.620 12/02/2020   INR 0.95 12/17/2010     Assessment & Plan:   Problem List Items Addressed This Visit       Genitourinary   Urinary tract infection with hematuria - Primary    Sending urine for culture to ensure eradication of bacteria.  Complete course of amoxicillin.      Relevant Orders   POCT URINALYSIS DIP (CLINITEK)   Urine Culture     Other   Murmur    Soft murmur noted at the right upper sternal border.  This is likely benign.  However, I am arranging echocardiogram for further evaluation.      Relevant Orders   ECHOCARDIOGRAM COMPLETE   Follow-up:  6 months  Raymond DO [place]

## 2022-05-11 LAB — URINE CULTURE: Organism ID, Bacteria: NO GROWTH

## 2022-05-20 ENCOUNTER — Other Ambulatory Visit: Payer: Self-pay | Admitting: *Deleted

## 2022-05-20 DIAGNOSIS — R011 Cardiac murmur, unspecified: Secondary | ICD-10-CM

## 2022-05-21 ENCOUNTER — Encounter: Payer: Self-pay | Admitting: Family Medicine

## 2022-05-21 ENCOUNTER — Ambulatory Visit (INDEPENDENT_AMBULATORY_CARE_PROVIDER_SITE_OTHER): Payer: Medicare HMO | Admitting: Family Medicine

## 2022-05-21 VITALS — BP 146/72 | HR 53 | Temp 97.7°F | Ht 66.0 in | Wt 183.0 lb

## 2022-05-21 DIAGNOSIS — I10 Essential (primary) hypertension: Secondary | ICD-10-CM | POA: Diagnosis not present

## 2022-05-21 DIAGNOSIS — M7918 Myalgia, other site: Secondary | ICD-10-CM | POA: Insufficient documentation

## 2022-05-21 MED ORDER — BACLOFEN 10 MG PO TABS
5.0000 mg | ORAL_TABLET | Freq: Three times a day (TID) | ORAL | 0 refills | Status: DC | PRN
Start: 2022-05-21 — End: 2023-02-21

## 2022-05-21 MED ORDER — LISINOPRIL 20 MG PO TABS: 40.0000 mg | ORAL_TABLET | Freq: Every morning | ORAL | 1 refills | Status: DC

## 2022-05-21 NOTE — Patient Instructions (Signed)
Lab in 7-10 days.  Increase the lisinopril to 40 mg (2, 20 mg tablets).  Heat to the area.  Follow up in 3 months.

## 2022-05-21 NOTE — Assessment & Plan Note (Addendum)
Uncontrolled/not at goal. Increasing lisinopril.  Metabolic panel in 7 to 10 days.

## 2022-05-21 NOTE — Progress Notes (Signed)
Subjective:  Patient ID: Michelle Landry, female    DOB: 1956-03-09  Age: 66 y.o. MRN: [id number]  CC: Chief Complaint  Patient presents with   Hypertension   Neck Pain    Reports has crook in neck since yesterday request muscle relaxer    HPI:  66 year old female presents for follow-up regarding hypertension.  BP elevated today.  She is on lisinopril 20 mg daily and indapamide 1.25 mg daily.  Last laboratory studies revealed hypokalemia.  I am not inclined to increase the indapamide due to risk of hypokalemia.  Will discuss increasing lisinopril.  Additionally, patient states that she woke up this morning and has a "crook" in her neck.  She has pain to the left side of the neck.  Area is tender to palpation.  No recent fall, trauma, injury.  Patient Active Problem List   Diagnosis Date Noted   Musculoskeletal pain 05/21/2022   Murmur 05/07/2022   Urinary tract infection with hematuria 05/07/2022   Hyperlipidemia 05/07/2021   Osteoarthritis 10/30/2015   Osteoporosis 12/03/2012   Hypertension 12/03/2012   Depression with anxiety 12/03/2012    Social Hx   Social History   Socioeconomic History   Marital status: Single    Spouse name: Not on file   Number of children: Not on file   Years of education: Not on file   Highest education level: Not on file  Occupational History   Not on file  Tobacco Use   Smoking status: Never   Smokeless tobacco: Never  Vaping Use   Vaping Use: Never used  Substance and Sexual Activity   Alcohol use: No    Alcohol/week: 0.0 standard drinks of alcohol   Drug use: No   Sexual activity: Never    Birth control/protection: Abstinence  Other Topics Concern   Not on file  Social History Narrative   Lives alone, has boston terrier named Custar.   Social Determinants of Health   Financial Resource Strain: Low Risk  (06/02/2021)   Overall Financial Resource Strain (CARDIA)    Difficulty of Paying Living Expenses: Not hard at all  Food  Insecurity: No Food Insecurity (06/02/2021)   Hunger Vital Sign    Worried About Running Out of Food in the Last Year: Never true    Ran Out of Food in the Last Year: Never true  Transportation Needs: No Transportation Needs (06/02/2021)   PRAPARE - Hydrologist (Medical): No    Lack of Transportation (Non-Medical): No  Physical Activity: Sufficiently Active (06/02/2021)   Exercise Vital Sign    Days of Exercise per Week: 5 days    Minutes of Exercise per Session: 30 min  Stress: No Stress Concern Present (06/02/2021)   [place]    Feeling of Stress : Not at all  Social Connections: Moderately Integrated (06/02/2021)   Social Connection and Isolation Panel [NHANES]    Frequency of Communication with Friends and Family: More than three times a week    Frequency of Social Gatherings with Friends and Family: More than three times a week    Attends Religious Services: More than 4 times per year    Active Member of [employer] or Organizations: Yes    Attends Music therapist: More than 4 times per year    Marital Status: Never married    Review of Systems Per HPI  Objective:  BP (!) 146/72   Pulse (!) 53  Temp 97.7 F (36.5 C)   Ht '5\' 6"'$  (1.676 m)   Wt 183 lb (83 kg)   SpO2 99%   BMI 29.54 kg/m      05/21/2022    8:38 AM 05/07/2022    8:53 AM 05/07/2022    8:28 AM  BP/Weight  Systolic BP [phone]  Diastolic BP 72 80 78  Wt. (Lbs) 183    BMI 29.54 kg/m2      Physical Exam Vitals and nursing note reviewed.  Constitutional:      General: She is not in acute distress.    Appearance: Normal appearance.  HENT:     Head: Normocephalic and atraumatic.  Neck:      Comments: Tenderness at the labeled location. Cardiovascular:     Rate and Rhythm: Normal rate and regular rhythm.  Pulmonary:     Effort: Pulmonary effort is normal.     Breath sounds: Normal breath  sounds.  Neurological:     Mental Status: She is alert.     Lab Results  Component Value Date   WBC 7.2 04/28/2022   HGB 13.3 04/28/2022   HCT 41.1 04/28/2022   PLT 228 04/28/2022   GLUCOSE 121 (H) 04/28/2022   CHOL 182 11/05/2021   TRIG 112 11/05/2021   HDL 51 11/05/2021   LDLCALC 111 (H) 11/05/2021   ALT 46 (H) 11/05/2021   AST 34 11/05/2021   NA 137 04/28/2022   K 3.4 (L) 04/28/2022   CL 104 04/28/2022   CREATININE 0.68 04/28/2022   BUN 15 04/28/2022   CO2 24 04/28/2022   TSH 1.620 12/02/2020   INR 0.95 12/17/2010     Assessment & Plan:   Problem List Items Addressed This Visit       Cardiovascular and Mediastinum   Hypertension    Uncontrolled/not at goal. Increasing lisinopril.  Metabolic panel in 7 to 10 days.       Relevant Medications   lisinopril (ZESTRIL) 20 MG tablet   Other Relevant Orders   Basic Metabolic Panel     Other   Musculoskeletal pain - Primary    Advised heat and baclofen.       Meds ordered this encounter  Medications   lisinopril (ZESTRIL) 20 MG tablet    Sig: Take 2 tablets (40 mg total) by mouth every morning.    Dispense:  180 tablet    Refill:  1   baclofen (LIORESAL) 10 MG tablet    Sig: Take 0.5-1 tablets (5-10 mg total) by mouth 3 (three) times daily as needed for muscle spasms.    Dispense:  30 each    Refill:  0    Follow-up:  3 months. Lab in 7-10 days.  [place]

## 2022-05-21 NOTE — Assessment & Plan Note (Signed)
Advised heat and baclofen.

## 2022-05-26 ENCOUNTER — Ambulatory Visit (HOSPITAL_COMMUNITY)
Admission: RE | Admit: 2022-05-26 | Discharge: 2022-05-26 | Disposition: A | Discharge status: Home / Self Care | Payer: Medicare HMO | Source: Ambulatory Visit | Attending: Family Medicine | Admitting: Family Medicine

## 2022-05-26 DIAGNOSIS — R011 Cardiac murmur, unspecified: Secondary | ICD-10-CM | POA: Diagnosis not present

## 2022-05-26 LAB — ECHOCARDIOGRAM COMPLETE
AR max vel: 2.09 cm2
AV Area VTI: 2.03 cm2
AV Area mean vel: 2.21 cm2
AV Mean grad: 8 mmHg
AV Peak grad: 16.5 mmHg
Ao pk vel: 2.03 m/s
Area-P 1/2: 2.32 cm2
MV M vel: 4.98 m/s
MV Peak grad: 99.2 mmHg
S' Lateral: 2.6 cm

## 2022-05-26 NOTE — Progress Notes (Signed)
*  PRELIMINARY RESULTS* Echocardiogram 2D Echocardiogram has been performed.  Michelle Landry 05/26/2022, 9:09 AM

## 2022-05-28 DIAGNOSIS — I10 Essential (primary) hypertension: Secondary | ICD-10-CM | POA: Diagnosis not present

## 2022-05-29 LAB — BASIC METABOLIC PANEL
BUN/Creatinine Ratio: 18 (ref 12–28)
BUN: 13 mg/dL (ref 8–27)
CO2: 27 mmol/L (ref 20–29)
Calcium: 10.3 mg/dL (ref 8.7–10.3)
Chloride: 101 mmol/L (ref 96–106)
Creatinine, Ser: 0.71 mg/dL (ref 0.57–1.00)
Glucose: 101 mg/dL — ABNORMAL HIGH (ref 70–99)
Potassium: 4.1 mmol/L (ref 3.5–5.2)
Sodium: 143 mmol/L (ref 134–144)
eGFR: 93 mL/min/{1.73_m2} (ref >59)

## 2022-06-10 NOTE — Patient Instructions (Signed)
Michelle Landry , Thank you for taking time to come for your Medicare Wellness Visit. I appreciate your ongoing commitment to your health goals. Please review the following plan we discussed and let me know if I can assist you in the future.   These are the goals we discussed:  Goals      DIET - REDUCE CALORIE INTAKE     Pt would like to lose weight.        This is a list of the screening recommended for you and due dates:  Health Maintenance  Topic Date Due   Medicare Annual Wellness Visit  06/02/2022   Mammogram  12/29/2023   DEXA scan (bone density measurement)  12/29/2023   DTaP/Tdap/Td vaccine (3 - Tdap) 04/29/2025   Colon Cancer Screening  08/04/2025   Flu Shot  Completed   Hepatitis C Screening: USPSTF Recommendation to screen - Ages 18-79 yo.  Completed   HPV Vaccine  Aged Out   Pneumonia Vaccine  Discontinued   COVID-19 Vaccine  Discontinued   Zoster (Shingles) Vaccine  Discontinued    Advanced directives: Advance directive discussed with you today. I have provided a copy for you to complete at home and have notarized. Once this is complete please bring a copy in to our office so we can scan it into your chart.   Conditions/risks identified: Aim for 30 minutes of exercise or brisk walking, 6-8 glasses of water, and 5 servings of fruits and vegetables each day.   Next appointment: Follow up in one year for your annual wellness visit    Preventive Care 65 Years and Older, Female Preventive care refers to lifestyle choices and visits with your health care provider that can promote health and wellness. What does preventive care include? A yearly physical exam. This is also called an annual well check. Dental exams once or twice a year. Routine eye exams. Ask your health care provider how often you should have your eyes checked. Personal lifestyle choices, including: Daily care of your teeth and gums. Regular physical activity. Eating a healthy diet. Avoiding tobacco and  drug use. Limiting alcohol use. Practicing safe sex. Taking low-dose aspirin every day. Taking vitamin and mineral supplements as recommended by your health care provider. What happens during an annual well check? The services and screenings done by your health care provider during your annual well check will depend on your age, overall health, lifestyle risk factors, and family history of disease. Counseling  Your health care provider may ask you questions about your: Alcohol use. Tobacco use. Drug use. Emotional well-being. Home and relationship well-being. Sexual activity. Eating habits. History of falls. Memory and ability to understand (cognition). Work and work Statistician. Reproductive health. Screening  You may have the following tests or measurements: Height, weight, and BMI. Blood pressure. Lipid and cholesterol levels. These may be checked every 5 years, or more frequently if you are over 1 years old. Skin check. Lung cancer screening. You may have this screening every year starting at age 6 if you have a 30-pack-year history of smoking and currently smoke or have quit within the past 15 years. Fecal occult blood test (FOBT) of the stool. You may have this test every year starting at age 72. Flexible sigmoidoscopy or colonoscopy. You may have a sigmoidoscopy every 5 years or a colonoscopy every 10 years starting at age 9. Hepatitis C blood test. Hepatitis B blood test. Sexually transmitted disease (STD) testing. Diabetes screening. This is done by checking your blood  sugar (glucose) after you have not eaten for a while (fasting). You may have this done every 1-3 years. Bone density scan. This is done to screen for osteoporosis. You may have this done starting at age 53. Mammogram. This may be done every 1-2 years. Talk to your health care provider about how often you should have regular mammograms. Talk with your health care provider about your test results, treatment  options, and if necessary, the need for more tests. Vaccines  Your health care provider may recommend certain vaccines, such as: Influenza vaccine. This is recommended every year. Tetanus, diphtheria, and acellular pertussis (Tdap, Td) vaccine. You may need a Td booster every 10 years. Zoster vaccine. You may need this after age 68. Pneumococcal 13-valent conjugate (PCV13) vaccine. One dose is recommended after age 49. Pneumococcal polysaccharide (PPSV23) vaccine. One dose is recommended after age 9. Talk to your health care provider about which screenings and vaccines you need and how often you need them. This information is not intended to replace advice given to you by your health care provider. Make sure you discuss any questions you have with your health care provider. Document Released: 06/20/2015 Document Revised: 02/11/2016 Document Reviewed: 03/25/2015 Elsevier Interactive Patient Education  2017 [place] Prevention in the Home Falls can cause injuries. They can happen to people of all ages. There are many things you can do to make your home safe and to help prevent falls. What can I do on the outside of my home? Regularly fix the edges of walkways and driveways and fix any cracks. Remove anything that might make you trip as you walk through a door, such as a raised step or threshold. Trim any bushes or trees on the path to your home. Use bright outdoor lighting. Clear any walking paths of anything that might make someone trip, such as rocks or tools. Regularly check to see if handrails are loose or broken. Make sure that both sides of any steps have handrails. Any raised decks and porches should have guardrails on the edges. Have any leaves, snow, or ice cleared regularly. Use sand or salt on walking paths during winter. Clean up any spills in your garage right away. This includes oil or grease spills. What can I do in the bathroom? Use night lights. Install grab  bars by the toilet and in the tub and shower. Do not use towel bars as grab bars. Use non-skid mats or decals in the tub or shower. If you need to sit down in the shower, use a plastic, non-slip stool. Keep the floor dry. Clean up any water that spills on the floor as soon as it happens. Remove soap buildup in the tub or shower regularly. Attach bath mats securely with double-sided non-slip rug tape. Do not have throw rugs and other things on the floor that can make you trip. What can I do in the bedroom? Use night lights. Make sure that you have a light by your bed that is easy to reach. Do not use any sheets or blankets that are too big for your bed. They should not hang down onto the floor. Have a firm chair that has side arms. You can use this for support while you get dressed. Do not have throw rugs and other things on the floor that can make you trip. What can I do in the kitchen? Clean up any spills right away. Avoid walking on wet floors. Keep items that you use a lot in easy-to-reach  places. If you need to reach something above you, use a strong step stool that has a grab bar. Keep electrical cords out of the way. Do not use floor polish or wax that makes floors slippery. If you must use wax, use non-skid floor wax. Do not have throw rugs and other things on the floor that can make you trip. What can I do with my stairs? Do not leave any items on the stairs. Make sure that there are handrails on both sides of the stairs and use them. Fix handrails that are broken or loose. Make sure that handrails are as long as the stairways. Check any carpeting to make sure that it is firmly attached to the stairs. Fix any carpet that is loose or worn. Avoid having throw rugs at the top or bottom of the stairs. If you do have throw rugs, attach them to the floor with carpet tape. Make sure that you have a light switch at the top of the stairs and the bottom of the stairs. If you do not have them,  ask someone to add them for you. What else can I do to help prevent falls? Wear shoes that: Do not have high heels. Have rubber bottoms. Are comfortable and fit you well. Are closed at the toe. Do not wear sandals. If you use a stepladder: Make sure that it is fully opened. Do not climb a closed stepladder. Make sure that both sides of the stepladder are locked into place. Ask someone to hold it for you, if possible. Clearly mark and make sure that you can see: Any grab bars or handrails. First and last steps. Where the edge of each step is. Use tools that help you move around (mobility aids) if they are needed. These include: Canes. Walkers. Scooters. Crutches. Turn on the lights when you go into a dark area. Replace any light bulbs as soon as they burn out. Set up your furniture so you have a clear path. Avoid moving your furniture around. If any of your floors are uneven, fix them. If there are any pets around you, be aware of where they are. Review your medicines with your doctor. Some medicines can make you feel dizzy. This can increase your chance of falling. Ask your doctor what other things that you can do to help prevent falls. This information is not intended to replace advice given to you by your health care provider. Make sure you discuss any questions you have with your health care provider. Document Released: 03/20/2009 Document Revised: 10/30/2015 Document Reviewed: 06/28/2014 Elsevier Interactive Patient Education  2017 [employer].

## 2022-06-10 NOTE — Progress Notes (Signed)
Subjective:   Michelle Landry is a 67 y.o. female who presents for Medicare Annual (Subsequent) preventive examination.  Review of Systems     Cardiac Risk Factors include: advanced age (>20mn, >>22women);hypertension     Objective:    Today's Vitals   06/11/22 0910  BP: 118/68  Weight: 184 lb 12.8 oz (83.8 kg)  Height: '5\' 6"'$  (1.676 m)   Body mass index is 29.83 kg/m.     06/11/2022    9:22 AM 04/28/2022    7:53 PM 06/02/2021    9:10 AM 08/04/2020    8:39 AM 01/31/2018    5:43 PM 06/04/2015    8:20 AM  Advanced Directives  Does Patient Have a Medical Advance Directive? No No No No No No  Would patient like information on creating a medical advance directive? Yes (MAU/Ambulatory/Procedural Areas - Information given) No - Patient declined No - Patient declined Yes (MAU/Ambulatory/Procedural Areas - Information given)  No - patient declined information    Current Medications (verified) Outpatient Encounter Medications as of 06/11/2022  Medication Sig   baclofen (LIORESAL) 10 MG tablet Take 0.5-1 tablets (5-10 mg total) by mouth 3 (three) times daily as needed for muscle spasms.   cholecalciferol (VITAMIN D) 25 MCG (1000 UNIT) tablet Take 1,000 Units by mouth daily.   citalopram (CELEXA) 20 MG tablet TAKE 1 TABLET BY MOUTH AT BEDTIME   ibuprofen (ADVIL) 200 MG tablet Take 400 mg by mouth every 8 (eight) hours as needed (arthritis pain.).   indapamide (LOZOL) 1.25 MG tablet TAKE 1 TABLET BY MOUTH IN THE MORNING FOR BLOOD PRESSURE AND FOR FLUID   ipratropium (ATROVENT) 0.06 % nasal spray Place 2 sprays into both nostrils 4 (four) times daily as needed for rhinitis.   lisinopril (ZESTRIL) 20 MG tablet Take 2 tablets (40 mg total) by mouth every morning.   meclizine (ANTIVERT) 25 MG tablet Take 1 tablet (25 mg total) by mouth 2 (two) times daily as needed for dizziness.   Multiple Vitamin (MULTIVITAMIN WITH MINERALS) TABS tablet Take 1 tablet by mouth daily.   naproxen (NAPROSYN) 500  MG tablet Take 1 tablet (500 mg total) by mouth 2 (two) times daily with a meal.   potassium chloride (KLOR-CON) 10 MEQ tablet Take 1 tablet by mouth once daily   rosuvastatin (CRESTOR) 10 MG tablet Take 1 tablet (10 mg total) by mouth daily.   vitamin C (ASCORBIC ACID) 500 MG tablet Take 500 mg by mouth daily.   [DISCONTINUED] AREXVY 120 MCG/0.5ML injection Inject 0.5 mLs into the muscle once.   No facility-administered encounter medications on file as of 06/11/2022.    Allergies (verified) Sulfa antibiotics   History: Past Medical History:  Diagnosis Date   Allergic rhinitis    History of bronchitis    Hypertension    Osteoporosis 2012   Past Surgical History:  Procedure Laterality Date   COLONOSCOPY  2006 MJ   ?POLYPS   COLONOSCOPY N/A 06/04/2015   Procedure: COLONOSCOPY;  Surgeon: SDanie Binder MD;  [facility];  Service: Endoscopy;  Laterality: N/A;  9:15   COLONOSCOPY WITH PROPOFOL N/A 08/04/2020   Procedure: COLONOSCOPY WITH PROPOFOL;  Surgeon: CEloise Harman DO;  Location: [facility];  Service: Endoscopy;  Laterality: N/A;  ASA II/ 9:00   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   POLYPECTOMY  08/04/2020   Procedure: POLYPECTOMY INTESTINAL;  Surgeon: CEloise Harman DO;  Location: [facility];  Service: Endoscopy;;  cecal colon polyp;  sigmoid colon polyp;    Right Knee Replacement  12/2010   Family History  Problem Relation Age of Onset   Hypertension Mother    Osteoporosis Mother    Heart disease Father 36       MI   Hypertension Sister    Diabetes Sister    Heart disease Sister    Cancer Sister        breast   Social History   Socioeconomic History   Marital status: Single    Spouse name: Not on file   Number of children: Not on file   Years of education: Not on file   Highest education level: Not on file  Occupational History   Not on file  Tobacco Use   Smoking status: Never   Smokeless tobacco: Never  Vaping Use   Vaping Use: Never used   Substance and Sexual Activity   Alcohol use: No    Alcohol/week: 0.0 standard drinks of alcohol   Drug use: No   Sexual activity: Never    Birth control/protection: Abstinence  Other Topics Concern   Not on file  Social History Narrative   Lives alone, has boston terrier named [place].   Social Determinants of Health   Financial Resource Strain: Low Risk  (06/11/2022)   Overall Financial Resource Strain (CARDIA)    Difficulty of Paying Living Expenses: Not hard at all  Food Insecurity: No Food Insecurity (06/11/2022)   Hunger Vital Sign    Worried About Running Out of Food in the Last Year: Never true    Ran Out of Food in the Last Year: Never true  Transportation Needs: No Transportation Needs (06/11/2022)   PRAPARE - Hydrologist (Medical): No    Lack of Transportation (Non-Medical): No  Physical Activity: Sufficiently Active (06/11/2022)   Exercise Vital Sign    Days of Exercise per Week: 5 days    Minutes of Exercise per Session: 30 min  Stress: No Stress Concern Present (06/11/2022)   [place]    Feeling of Stress : Not at all  Social Connections: Moderately Integrated (06/11/2022)   Social Connection and Isolation Panel [NHANES]    Frequency of Communication with Friends and Family: More than three times a week    Frequency of Social Gatherings with Friends and Family: Three times a week    Attends Religious Services: More than 4 times per year    Active Member of Clubs or Organizations: Yes    Attends Music therapist: More than 4 times per year    Marital Status: Never married    Tobacco Counseling Counseling given: Not Answered   Clinical Intake:  Pre-visit preparation completed: Yes  Pain : No/denies pain     Diabetes: No  How often do you need to have someone help you when you read instructions, pamphlets, or other written materials from your doctor or  pharmacy?: 1 - Never  Diabetic?No   Interpreter Needed?: No  Information entered by :: Denman George LPN   Activities of Daily Living    06/11/2022    9:14 AM  In your present state of health, do you have any difficulty performing the following activities:  Hearing? 0  Vision? 0  Difficulty concentrating or making decisions? 0  Walking or climbing stairs? 0  Dressing or bathing? 0  Doing errands, shopping? 0  Preparing Food and eating ? N  Using the Toilet? N  In the past six months, have you accidently leaked urine? N  Do you have problems with loss of bowel control? N  Managing your Medications? N  Managing your Finances? N  Housekeeping or managing your Housekeeping? N    Patient Care Team: Coral Spikes, DO as PCP - General (Family Medicine) Eloise Harman, DO as Consulting Physician (Internal Medicine) Coral Spikes, DO as Consulting Physician (Family Medicine)  Indicate any recent Medical Services you may have received from other than [facility] providers in the past year (date may be approximate).     Assessment:   This is a routine wellness examination for Javeah.  Hearing/Vision screen Hearing Screening - Comments:: Denies hearing difficulties  Vision Screening - Comments::  up to date with routine eye exams; denies vision problems    Dietary issues and exercise activities discussed: Current Exercise Habits: Home exercise routine, Type of exercise: walking, Time (Minutes): 30, Frequency (Times/Week): 5, Weekly Exercise (Minutes/Week): 150, Intensity: Mild   Goals Addressed   None    Depression Screen    06/11/2022    9:21 AM 05/21/2022    8:42 AM 05/07/2022    8:33 AM 03/26/2022    4:04 PM 06/02/2021    9:07 AM 05/07/2021    8:19 AM 12/02/2020   11:49 AM  PHQ 2/9 Scores  PHQ - 2 Score 0 0 0 0 0 0 0  PHQ- 9 Score  0     4    Fall Risk    06/11/2022    9:21 AM 05/21/2022    8:42 AM 05/07/2022    8:33 AM 03/26/2022    4:04 PM 06/02/2021    9:10 AM   [place] in the past year? 0 0 0 0 0  Number falls in past yr: 0 0 0 0 0  Injury with Fall? 0 0 0 0 0  Risk for fall due to : No Fall Risks No Fall Risks  No Fall Risks Impaired mobility;Orthopedic patient  Follow up Falls evaluation completed;Education provided;Falls prevention discussed Falls evaluation completed  Falls evaluation completed Falls prevention discussed    FALL RISK PREVENTION PERTAINING TO THE HOME:  Any stairs in or around the home? Yes  If so, are there any without handrails? No  Home free of loose throw rugs in walkways, pet beds, electrical cords, etc? Yes  Adequate lighting in your home to reduce risk of falls? Yes   ASSISTIVE DEVICES UTILIZED TO PREVENT FALLS:  Life alert? No  Use of a cane, walker or w/c? No  Grab bars in the bathroom? Yes  Shower chair or bench in shower? No  Elevated toilet seat or a handicapped toilet? Yes   TIMED UP AND GO:  Was the test performed? Yes .  Length of time to ambulate 10 feet: 5 sec.   Gait steady and fast without use of assistive device  Cognitive Function:        06/11/2022    9:15 AM 06/02/2021    9:14 AM  6CIT Screen  What Year? 0 points 0 points  What month? 0 points 0 points  What time? 0 points 0 points  Count back from 20 0 points 0 points  Months in reverse 0 points 0 points  Repeat phrase 0 points 0 points  Total Score 0 points 0 points    Immunizations Immunization History  Administered Date(s) Administered   Influenza, Quadrivalent, Recombinant, Inj, Pf 03/15/2019   Influenza,inj,Quad PF,6+ Mos 03/13/2015, 04/16/2016,  03/28/2017   Influenza-Unspecified 04/07/2012, 03/21/2013, 04/08/2014, 03/07/2018, 03/26/2020, 03/30/2021, 04/16/2022   Moderna Sars-Covid-2 Vaccination 08/16/2019, 09/15/2019, 06/02/2020   Pneumococcal Conjugate-13 12/05/2019   Pneumococcal-Unspecified 03/30/2021   RSV,unspecified 04/16/2022   Td 01/01/2005, 04/30/2015   Zoster, Live 06/24/2014   Zoster,  Unspecified 06/22/2021, 08/21/2021    TDAP status: Up to date  Flu Vaccine status: Up to date  Pneumococcal vaccine status: Up to date  Covid-19 vaccine status: Information provided on how to obtain vaccines.   Qualifies for Shingles Vaccine? Yes   Zostavax completed No   Shingrix Completed?: Yes  Screening Tests Health Maintenance  Topic Date Due   MAMMOGRAM  12/29/2022   Medicare Annual Wellness (AWV)  06/12/2023   DEXA SCAN  12/29/2023   DTaP/Tdap/Td (3 - Tdap) 04/29/2025   COLONOSCOPY (Pts 45-22yr Insurance coverage will need to be confirmed)  08/04/2025   INFLUENZA VACCINE  Completed   Hepatitis C Screening  Completed   HPV VACCINES  Aged Out   Pneumonia Vaccine 68 Years old  Discontinued   COVID-19 Vaccine  Discontinued   Zoster Vaccines- Shingrix  Discontinued    Health Maintenance  There are no preventive care reminders to display for this patient.   Colorectal cancer screening: Type of screening: Colonoscopy. Completed 08/04/20. Repeat every 5 years  Mammogram status: Completed 12/28/21. Repeat every year  Bone Density status: Completed 12/28/21. Results reflect: Bone density results: NORMAL. Repeat every 2 years.  Lung Cancer Screening: (Low Dose CT Chest recommended if Age 733-80years, 30 pack-year currently smoking OR have quit w/in 15years.) does not qualify.   Lung Cancer Screening Referral: n/a  Additional Screening:  Hepatitis C Screening: does qualify; Completed 04/30/15  Vision Screening: Recommended annual ophthalmology exams for early detection of glaucoma and other disorders of the eye. Is the patient up to date with their annual eye exam?  Yes  Who is the provider or what is the name of the office in which the patient attends annual eye exams? Unable to provide name  If pt is not established with a provider, would they like to be referred to a provider to establish care? No .   Dental Screening: Recommended annual dental exams for proper  oral hygiene  Community Resource Referral / Chronic Care Management: CRR required this visit?  No   CCM required this visit?  No      Plan:     I have personally reviewed and noted the following in the patient's chart:   Medical and social history Use of alcohol, tobacco or illicit drugs  Current medications and supplements including opioid prescriptions. Patient is not currently taking opioid prescriptions. Functional ability and status Nutritional status Physical activity Advanced directives List of other physicians Hospitalizations, surgeries, and ER visits in previous 12 months Vitals Screenings to include cognitive, depression, and falls Referrals and appointments  In addition, I have reviewed and discussed with patient certain preventive protocols, quality metrics, and best practice recommendations. A written personalized care plan for preventive services as well as general preventive health recommendations were provided to patient.     [place]enman GeorgeB[place]la[place]  17/11/7339  Nurse Notes: No concerns

## 2022-06-11 ENCOUNTER — Ambulatory Visit (INDEPENDENT_AMBULATORY_CARE_PROVIDER_SITE_OTHER): Payer: Medicare HMO

## 2022-06-11 VITALS — BP 118/68 | Ht 66.0 in | Wt 184.8 lb

## 2022-06-11 DIAGNOSIS — Z0000 Encounter for general adult medical examination without abnormal findings: Secondary | ICD-10-CM | POA: Diagnosis not present

## 2022-07-01 ENCOUNTER — Other Ambulatory Visit: Payer: Self-pay | Admitting: Family Medicine

## 2022-07-01 DIAGNOSIS — I10 Essential (primary) hypertension: Secondary | ICD-10-CM

## 2022-07-05 ENCOUNTER — Other Ambulatory Visit: Payer: Self-pay | Admitting: Family Medicine

## 2022-07-05 ENCOUNTER — Telehealth: Payer: Self-pay | Admitting: *Deleted

## 2022-07-05 DIAGNOSIS — I10 Essential (primary) hypertension: Secondary | ICD-10-CM

## 2022-07-05 MED ORDER — LISINOPRIL 40 MG PO TABS: 40.0000 mg | ORAL_TABLET | Freq: Every morning | ORAL | 3 refills | Status: DC

## 2022-07-05 NOTE — Telephone Encounter (Signed)
Patient called and stated she was told to double her Lisinopril 20 mg and she needs a new script sent in for 40 mg if you would like her to continue with the increased dosage

## 2022-07-05 NOTE — Telephone Encounter (Signed)
Coral Spikes, DO     New Rx sent for 40 mg tablets. 1 tablet daily.

## 2022-07-05 NOTE — Telephone Encounter (Signed)
Patient notified

## 2022-08-02 ENCOUNTER — Other Ambulatory Visit: Payer: Self-pay | Admitting: Family Medicine

## 2022-08-02 DIAGNOSIS — I10 Essential (primary) hypertension: Secondary | ICD-10-CM

## 2022-08-20 ENCOUNTER — Ambulatory Visit (INDEPENDENT_AMBULATORY_CARE_PROVIDER_SITE_OTHER): Payer: Medicare HMO | Admitting: Family Medicine

## 2022-08-20 VITALS — BP 124/79 | HR 61 | Temp 97.6°F | Ht 66.0 in | Wt 186.0 lb

## 2022-08-20 DIAGNOSIS — J988 Other specified respiratory disorders: Secondary | ICD-10-CM | POA: Diagnosis not present

## 2022-08-20 DIAGNOSIS — R739 Hyperglycemia, unspecified: Secondary | ICD-10-CM

## 2022-08-20 DIAGNOSIS — E7800 Pure hypercholesterolemia, unspecified: Secondary | ICD-10-CM

## 2022-08-20 DIAGNOSIS — I10 Essential (primary) hypertension: Secondary | ICD-10-CM | POA: Diagnosis not present

## 2022-08-20 MED ORDER — AMOXICILLIN-POT CLAVULANATE 875-125 MG PO TABS: 1.0000 | ORAL_TABLET | Freq: Two times a day (BID) | ORAL | 0 refills | Status: DC

## 2022-08-20 NOTE — Assessment & Plan Note (Signed)
Given duration of illness and lack of improvement, placing on Augmentin.

## 2022-08-20 NOTE — Assessment & Plan Note (Signed)
Lipid panel today. Continue Crestor.  

## 2022-08-20 NOTE — Progress Notes (Signed)
Subjective:  Patient ID: Michelle Landry, female    DOB: 12/07/1955  Age: 67 y.o. MRN: [id number]  CC: Chief Complaint  Patient presents with   Hypertension   Sore Throat    X2 weeks    HPI:  67 year old female with hypertension, osteoarthritis, osteoporosis, depression and anxiety, hyperlipidemia presents for follow-up.  Blood pressure well-controlled on indapamide and lisinopril.  Patient reports recent respiratory symptoms.  2-week history of symptoms.  Started out with watery eyes and runny nose.  Has now progressed to sore throat, ear pain, and cough.  No relieving factors.  No fever.  She states that her throat is quite sore and it is quite troublesome.  Patient needs labs to reassess hyperlipidemia.  She is currently on Crestor 10 mg daily.  Patient Active Problem List   Diagnosis Date Noted   Respiratory infection 08/20/2022   Murmur 05/07/2022   Hyperlipidemia 05/07/2021   Osteoarthritis 10/30/2015   Osteoporosis 12/03/2012   Hypertension 12/03/2012   Depression with anxiety 12/03/2012    Social Hx   Social History   Socioeconomic History   Marital status: Single    Spouse name: Not on file   Number of children: Not on file   Years of education: Not on file   Highest education level: Not on file  Occupational History   Not on file  Tobacco Use   Smoking status: Never   Smokeless tobacco: Never  Vaping Use   Vaping Use: Never used  Substance and Sexual Activity   Alcohol use: No    Alcohol/week: 0.0 standard drinks of alcohol   Drug use: No   Sexual activity: Never    Birth control/protection: Abstinence  Other Topics Concern   Not on file  Social History Narrative   Lives alone, has boston terrier named [place].   Social Determinants of Health   Financial Resource Strain: Low Risk  (06/11/2022)   Overall Financial Resource Strain (CARDIA)    Difficulty of Paying Living Expenses: Not hard at all  Food Insecurity: No Food Insecurity (06/11/2022)    Hunger Vital Sign    Worried About Running Out of Food in the Last Year: Never true    Ran Out of Food in the Last Year: Never true  Transportation Needs: No Transportation Needs (06/11/2022)   PRAPARE - Hydrologist (Medical): No    Lack of Transportation (Non-Medical): No  Physical Activity: Sufficiently Active (06/11/2022)   Exercise Vital Sign    Days of Exercise per Week: 5 days    Minutes of Exercise per Session: 30 min  Stress: No Stress Concern Present (06/11/2022)   [place]    Feeling of Stress : Not at all  Social Connections: Moderately Integrated (06/11/2022)   Social Connection and Isolation Panel [NHANES]    Frequency of Communication with Friends and Family: More than three times a week    Frequency of Social Gatherings with Friends and Family: Three times a week    Attends Religious Services: More than 4 times per year    Active Member of Clubs or Organizations: Yes    Attends Archivist Meetings: More than 4 times per year    Marital Status: Never married    Review of Systems Per HPI  Objective:  BP 124/79   Pulse 61   Temp 97.6 F (36.4 C)   Ht 5\' 6"  (1.676 m)   Wt 186 lb (84.4 kg)  SpO2 97%   BMI 30.02 kg/m      08/20/2022    8:30 AM 06/11/2022    9:10 AM 05/21/2022    8:38 AM  BP/Weight  Systolic BP [id number]  Diastolic BP 79 68 72  Wt. (Lbs) 186 184.8 183  BMI 30.02 kg/m2 29.83 kg/m2 29.54 kg/m2    Physical Exam Vitals and nursing note reviewed.  Constitutional:      General: She is not in acute distress.    Appearance: Normal appearance.  HENT:     Head: Normocephalic and atraumatic.     Right Ear: Tympanic membrane normal.     Left Ear: Tympanic membrane normal.     Mouth/Throat:     Pharynx: Posterior oropharyngeal erythema present. No oropharyngeal exudate.  Eyes:     General:        Right eye: No discharge.        Left eye: No  discharge.     Conjunctiva/sclera: Conjunctivae normal.  Cardiovascular:     Rate and Rhythm: Normal rate and regular rhythm.  Pulmonary:     Effort: Pulmonary effort is normal.     Breath sounds: Normal breath sounds. No wheezing, rhonchi or rales.  Neurological:     Mental Status: She is alert.  Psychiatric:        Mood and Affect: Mood normal.        Behavior: Behavior normal.     Lab Results  Component Value Date   WBC 7.2 04/28/2022   HGB 13.3 04/28/2022   HCT 41.1 04/28/2022   PLT 228 04/28/2022   GLUCOSE 101 (H) 05/28/2022   CHOL 182 11/05/2021   TRIG 112 11/05/2021   HDL 51 11/05/2021   LDLCALC 111 (H) 11/05/2021   ALT 46 (H) 11/05/2021   AST 34 11/05/2021   NA 143 05/28/2022   K 4.1 05/28/2022   CL 101 05/28/2022   CREATININE 0.71 05/28/2022   BUN 13 05/28/2022   CO2 27 05/28/2022   TSH 1.620 12/02/2020   INR 0.95 12/17/2010     Assessment & Plan:   Problem List Items Addressed This Visit       Cardiovascular and Mediastinum   Hypertension - Primary    Well-controlled.  Continue indapamide and lisinopril.        Respiratory   Respiratory infection    Given duration of illness and lack of improvement, placing on Augmentin.        Other   Hyperlipidemia    Lipid panel today.  Continue Crestor.      Relevant Orders   Lipid panel   Other Visit Diagnoses     Blood glucose elevated       Relevant Orders   Hemoglobin A1c       Meds ordered this encounter  Medications   amoxicillin-clavulanate (AUGMENTIN) 875-125 MG tablet    Sig: Take 1 tablet by mouth 2 (two) times daily.    Dispense:  20 tablet    Refill:  0    Follow-up:  Return in about 6 months (around 02/20/2023).  [place]

## 2022-08-20 NOTE — Patient Instructions (Signed)
Labs today.  Antibiotic as prescribed.  Follow up in 6 months.

## 2022-08-20 NOTE — Assessment & Plan Note (Signed)
Well-controlled.  Continue indapamide and lisinopril.

## 2022-08-21 LAB — LIPID PANEL
Chol/HDL Ratio: 2.7 ratio (ref 0.0–4.4)
Cholesterol, Total: 127 mg/dL (ref 100–199)
HDL: 47 mg/dL (ref >39)
LDL Chol Calc (NIH): 58 mg/dL (ref 0–99)
Triglycerides: 122 mg/dL (ref 0–149)
VLDL Cholesterol Cal: 22 mg/dL (ref 5–40)

## 2022-08-21 LAB — HEMOGLOBIN A1C
Est. average glucose Bld gHb Est-mCnc: 120 mg/dL
Hgb A1c MFr Bld: 5.8 % — ABNORMAL HIGH (ref 4.8–5.6)

## 2022-08-28 ENCOUNTER — Other Ambulatory Visit: Payer: Self-pay | Admitting: Family Medicine

## 2022-08-28 DIAGNOSIS — F418 Other specified anxiety disorders: Secondary | ICD-10-CM

## 2022-09-03 ENCOUNTER — Other Ambulatory Visit: Payer: Self-pay | Admitting: Family Medicine

## 2022-09-10 ENCOUNTER — Other Ambulatory Visit: Payer: Self-pay | Admitting: *Deleted

## 2022-09-10 MED ORDER — ROSUVASTATIN CALCIUM 10 MG PO TABS: 10.0000 mg | ORAL_TABLET | Freq: Every day | ORAL | 1 refills | Status: DC

## 2022-11-09 ENCOUNTER — Other Ambulatory Visit: Payer: Self-pay | Admitting: Family Medicine

## 2022-11-09 DIAGNOSIS — I10 Essential (primary) hypertension: Secondary | ICD-10-CM

## 2022-11-11 ENCOUNTER — Other Ambulatory Visit: Payer: Self-pay | Admitting: Family Medicine

## 2022-11-11 DIAGNOSIS — J069 Acute upper respiratory infection, unspecified: Secondary | ICD-10-CM | POA: Diagnosis not present

## 2022-11-11 DIAGNOSIS — R059 Cough, unspecified: Secondary | ICD-10-CM | POA: Diagnosis not present

## 2022-11-11 DIAGNOSIS — E669 Obesity, unspecified: Secondary | ICD-10-CM | POA: Diagnosis not present

## 2022-11-11 DIAGNOSIS — Z6830 Body mass index (BMI) 30.0-30.9, adult: Secondary | ICD-10-CM | POA: Diagnosis not present

## 2022-11-12 ENCOUNTER — Other Ambulatory Visit: Payer: Self-pay | Admitting: Family Medicine

## 2022-11-24 ENCOUNTER — Other Ambulatory Visit (HOSPITAL_COMMUNITY): Payer: Self-pay | Admitting: Family Medicine

## 2022-11-24 DIAGNOSIS — Z1231 Encounter for screening mammogram for malignant neoplasm of breast: Secondary | ICD-10-CM

## 2022-12-12 ENCOUNTER — Other Ambulatory Visit: Payer: Self-pay | Admitting: Family Medicine

## 2022-12-12 DIAGNOSIS — F418 Other specified anxiety disorders: Secondary | ICD-10-CM

## 2022-12-31 ENCOUNTER — Ambulatory Visit (HOSPITAL_COMMUNITY)
Admission: RE | Admit: 2022-12-31 | Discharge: 2022-12-31 | Disposition: A | Discharge status: Home / Self Care | Payer: Medicare HMO | Source: Ambulatory Visit | Attending: Family Medicine | Admitting: Family Medicine

## 2022-12-31 DIAGNOSIS — Z1231 Encounter for screening mammogram for malignant neoplasm of breast: Secondary | ICD-10-CM | POA: Diagnosis not present

## 2023-02-08 ENCOUNTER — Other Ambulatory Visit: Payer: Self-pay | Admitting: Family Medicine

## 2023-02-08 DIAGNOSIS — I10 Essential (primary) hypertension: Secondary | ICD-10-CM

## 2023-02-21 ENCOUNTER — Ambulatory Visit (INDEPENDENT_AMBULATORY_CARE_PROVIDER_SITE_OTHER): Payer: Medicare HMO | Admitting: Family Medicine

## 2023-02-21 VITALS — BP 138/87 | HR 61 | Temp 98.7°F | Ht 66.0 in | Wt 177.0 lb

## 2023-02-21 DIAGNOSIS — J019 Acute sinusitis, unspecified: Secondary | ICD-10-CM

## 2023-02-21 DIAGNOSIS — R509 Fever, unspecified: Secondary | ICD-10-CM | POA: Diagnosis not present

## 2023-02-21 DIAGNOSIS — E7800 Pure hypercholesterolemia, unspecified: Secondary | ICD-10-CM

## 2023-02-21 DIAGNOSIS — I10 Essential (primary) hypertension: Secondary | ICD-10-CM | POA: Diagnosis not present

## 2023-02-21 DIAGNOSIS — J329 Chronic sinusitis, unspecified: Secondary | ICD-10-CM | POA: Insufficient documentation

## 2023-02-21 MED ORDER — AMOXICILLIN-POT CLAVULANATE 875-125 MG PO TABS: 1.0000 | ORAL_TABLET | Freq: Two times a day (BID) | ORAL | 0 refills | Status: DC

## 2023-02-21 NOTE — Assessment & Plan Note (Signed)
Stable.  Continue indapamide and lisinopril.

## 2023-02-21 NOTE — Progress Notes (Signed)
Subjective:  Patient ID: Michelle Landry, female    DOB: [birth year]  Age: 67 y.o. MRN: [id number]  CC: Chief Complaint  Patient presents with   Hypertension   Nasal Congestion    Ears stopped up , since last week, weakness, headache, loose stool, cough and chest congestion, fever    HPI:  67 year old female with hypertension, osteoarthritis, osteoporosis, depression anxiety, hyperlipidemia presents for follow-up.  Patient is not feeling well.  She has been sick since last Sunday.  She states that she has had sore throat, diarrhea, fever Tmax 100.7.  She is now having severe sinus pressure and congestion.  She has had a recent sick contact.  No COVID testing.  Hypertension stable on indapamide and lisinopril.  Lipids at goal on Crestor.  Patient Active Problem List   Diagnosis Date Noted   Sinusitis 02/21/2023   Murmur 05/07/2022   Hyperlipidemia 05/07/2021   Osteoarthritis 10/30/2015   Osteoporosis 12/03/2012   Hypertension 12/03/2012   Depression with anxiety 12/03/2012    Social Hx   Social History   Socioeconomic History   Marital status: Single    Spouse name: Not on file   Number of children: Not on file   Years of education: Not on file   Highest education level: Not on file  Occupational History   Not on file  Tobacco Use   Smoking status: Never   Smokeless tobacco: Never  Vaping Use   Vaping status: Never Used  Substance and Sexual Activity   Alcohol use: No    Alcohol/week: 0.0 standard drinks of alcohol   Drug use: No   Sexual activity: Never    Birth control/protection: Abstinence  Other Topics Concern   Not on file  Social History Narrative   Lives alone, has boston terrier named [place].   Social Determinants of Health   Financial Resource Strain: Low Risk  (06/11/2022)   Overall Financial Resource Strain (CARDIA)    Difficulty of Paying Living Expenses: Not hard at all  Food Insecurity: No Food Insecurity (06/11/2022)   Hunger Vital Sign     Worried About Running Out of Food in the Last Year: Never true    Ran Out of Food in the Last Year: Never true  Transportation Needs: No Transportation Needs (06/11/2022)   PRAPARE - Administrator, Civil Service (Medical): No    Lack of Transportation (Non-Medical): No  Physical Activity: Sufficiently Active (06/11/2022)   Exercise Vital Sign    Days of Exercise per Week: 5 days    Minutes of Exercise per Session: 30 min  Stress: No Stress Concern Present (06/11/2022)   [employer] of Occupational Health - Occupational Stress Questionnaire    Feeling of Stress : Not at all  Social Connections: Moderately Integrated (06/11/2022)   Social Connection and Isolation Panel [NHANES]    Frequency of Communication with Friends and Family: More than three times a week    Frequency of Social Gatherings with Friends and Family: Three times a week    Attends Religious Services: More than 4 times per year    Active Member of Clubs or Organizations: Yes    Attends Banker Meetings: More than 4 times per year    Marital Status: Never married    Review of Systems Per HPI  Objective:  BP 138/87   Pulse 61   Temp 98.7 F (37.1 C)   Ht 5\' 6"  (1.676 m)   Wt 177 lb (80.3 kg)   SpO2  97%   BMI 28.57 kg/m      02/21/2023    8:19 AM 08/20/2022    8:30 AM 06/11/2022    9:10 AM  BP/Weight  Systolic BP 138 124 118  Diastolic BP 87 79 68  Wt. (Lbs) 177 186 184.8  BMI 28.57 kg/m2 30.02 kg/m2 29.83 kg/m2    Physical Exam Vitals and nursing note reviewed.  Constitutional:      General: She is not in acute distress.    Appearance: Normal appearance.  HENT:     Head: Normocephalic and atraumatic.     Nose: Congestion present.     Mouth/Throat:     Pharynx: Oropharynx is clear.  Cardiovascular:     Rate and Rhythm: Normal rate and regular rhythm.  Pulmonary:     Effort: Pulmonary effort is normal.     Breath sounds: Normal breath sounds. No wheezing, rhonchi or rales.   Neurological:     Mental Status: She is alert.     Lab Results  Component Value Date   WBC 7.2 04/28/2022   HGB 13.3 04/28/2022   HCT 41.1 04/28/2022   PLT 228 04/28/2022   GLUCOSE 101 (H) 05/28/2022   CHOL 127 08/20/2022   TRIG 122 08/20/2022   HDL 47 08/20/2022   LDLCALC 58 08/20/2022   ALT 46 (H) 11/05/2021   AST 34 11/05/2021   NA 143 05/28/2022   K 4.1 05/28/2022   CL 101 05/28/2022   CREATININE 0.71 05/28/2022   BUN 13 05/28/2022   CO2 27 05/28/2022   TSH 1.620 12/02/2020   INR 0.95 12/17/2010   HGBA1C 5.8 (H) 08/20/2022     Assessment & Plan:   Problem List Items Addressed This Visit       Cardiovascular and Mediastinum   Hypertension - Primary    Stable.  Continue indapamide and lisinopril.      Relevant Orders   CMP14+EGFR     Respiratory   Sinusitis    Possible recent COVID-19.  No testing done at this time given the fact that she is out of the treatment window.  Suspect secondary bacterial infection.  Treating with Augmentin.      Relevant Medications   amoxicillin-clavulanate (AUGMENTIN) 875-125 MG tablet     Other   Hyperlipidemia    At goal on Crestor.  Continue.      Relevant Orders   Lipid panel   Other Visit Diagnoses     Fever, unspecified fever cause       Relevant Orders   CBC       Meds ordered this encounter  Medications   amoxicillin-clavulanate (AUGMENTIN) 875-125 MG tablet    Sig: Take 1 tablet by mouth 2 (two) times daily.    Dispense:  14 tablet    Refill:  0    Follow-up:  Return in about 6 months (around 08/21/2023).  Everlene Other DO [facility]

## 2023-02-21 NOTE — Patient Instructions (Signed)
Medication as prescribed.  Labs today.  Follow up in 6 months or sooner if needed (if you continue to have respiratory symptoms).

## 2023-02-21 NOTE — Assessment & Plan Note (Signed)
Possible recent COVID-19.  No testing done at this time given the fact that she is out of the treatment window.  Suspect secondary bacterial infection.  Treating with Augmentin.

## 2023-02-21 NOTE — Assessment & Plan Note (Signed)
At goal on Crestor. Continue.

## 2023-02-22 LAB — CMP14+EGFR
ALT: 27 IU/L (ref 0–32)
AST: 25 IU/L (ref 0–40)
Albumin: 4.4 g/dL (ref 3.9–4.9)
Alkaline Phosphatase: 83 IU/L (ref 44–121)
BUN/Creatinine Ratio: 13 (ref 12–28)
BUN: 10 mg/dL (ref 8–27)
Bilirubin Total: 0.7 mg/dL (ref 0.0–1.2)
CO2: 24 mmol/L (ref 20–29)
Calcium: 9.7 mg/dL (ref 8.7–10.3)
Chloride: 103 mmol/L (ref 96–106)
Creatinine, Ser: 0.77 mg/dL (ref 0.57–1.00)
Globulin, Total: 2.2 g/dL (ref 1.5–4.5)
Glucose: 107 mg/dL — ABNORMAL HIGH (ref 70–99)
Potassium: 3.9 mmol/L (ref 3.5–5.2)
Sodium: 144 mmol/L (ref 134–144)
Total Protein: 6.6 g/dL (ref 6.0–8.5)
eGFR: 84 mL/min/{1.73_m2} (ref >59)

## 2023-02-22 LAB — LIPID PANEL
Chol/HDL Ratio: 2.8 ratio (ref 0.0–4.4)
Cholesterol, Total: 109 mg/dL (ref 100–199)
HDL: 39 mg/dL — ABNORMAL LOW (ref >39)
LDL Chol Calc (NIH): 51 mg/dL (ref 0–99)
Triglycerides: 98 mg/dL (ref 0–149)
VLDL Cholesterol Cal: 19 mg/dL (ref 5–40)

## 2023-02-22 LAB — CBC
Hematocrit: 41.9 % (ref 34.0–46.6)
Hemoglobin: 13.6 g/dL (ref 11.1–15.9)
MCH: 28.3 pg (ref 26.6–33.0)
MCHC: 32.5 g/dL (ref 31.5–35.7)
MCV: 87 fL (ref 79–97)
Platelets: 225 10*3/uL (ref 150–450)
RBC: 4.81 x10E6/uL (ref 3.77–5.28)
RDW: 13.8 % (ref 11.7–15.4)
WBC: 4 10*3/uL (ref 3.4–10.8)

## 2023-03-14 ENCOUNTER — Other Ambulatory Visit: Payer: Self-pay | Admitting: Family Medicine

## 2023-03-14 DIAGNOSIS — F418 Other specified anxiety disorders: Secondary | ICD-10-CM

## 2023-04-12 ENCOUNTER — Other Ambulatory Visit: Payer: Self-pay | Admitting: Family Medicine

## 2023-05-10 ENCOUNTER — Other Ambulatory Visit: Payer: Self-pay | Admitting: Family Medicine

## 2023-05-10 DIAGNOSIS — I10 Essential (primary) hypertension: Secondary | ICD-10-CM

## 2023-06-17 ENCOUNTER — Ambulatory Visit (INDEPENDENT_AMBULATORY_CARE_PROVIDER_SITE_OTHER): Payer: HMO

## 2023-06-17 VITALS — BP 123/73 | HR 62 | Ht 66.0 in | Wt 177.6 lb

## 2023-06-17 DIAGNOSIS — Z0000 Encounter for general adult medical examination without abnormal findings: Secondary | ICD-10-CM | POA: Diagnosis not present

## 2023-06-17 NOTE — Patient Instructions (Signed)
 Ms. Chamblee , Thank you for taking time to come for your Medicare Wellness Visit. I appreciate your ongoing commitment to your health goals. Please review the following plan we discussed and let me know if I can assist you in the future.   Referrals/Orders/Follow-Ups/Clinician Recommendations:  Next Medicare Wellness Appt: June 22, 2024 at 8:40 am virtual visit  This is a list of the screening recommended for you and due dates:  Health Maintenance  Topic Date Due   DEXA scan (bone density measurement)  12/29/2023   Mammogram  12/31/2023   Medicare Annual Wellness Visit  06/16/2024   DTaP/Tdap/Td vaccine (3 - Tdap) 04/29/2025   Colon Cancer Screening  08/04/2025   Flu Shot  Completed   Hepatitis C Screening  Completed   HPV Vaccine  Aged Out   Pneumonia Vaccine  Discontinued   COVID-19 Vaccine  Discontinued   Zoster (Shingles) Vaccine  Discontinued    Advanced directives: (Provided) Advance directive discussed with you today. I have provided a copy for you to complete at home and have notarized. Once this is complete, please bring a copy in to our office so we can scan it into your chart.   Next Medicare Annual Wellness Visit scheduled for next year: Yes  Preventive Care 12 Years and Older, Female Preventive care refers to lifestyle choices and visits with your health care provider that can promote health and wellness. Preventive care visits are also called wellness exams. What can I expect for my preventive care visit? Counseling Your health care provider may ask you questions about your: Medical history, including: Past medical problems. Family medical history. Pregnancy and menstrual history. History of falls. Current health, including: Memory and ability to understand (cognition). Emotional well-being. Home life and relationship well-being. Sexual activity and sexual health. Lifestyle, including: Alcohol, nicotine or tobacco, and drug use. Access to firearms. Diet,  exercise, and sleep habits. Work and work astronomer. Sunscreen use. Safety issues such as seatbelt and bike helmet use. Physical exam Your health care provider will check your: Height and weight. These may be used to calculate your BMI (body mass index). BMI is a measurement that tells if you are at a healthy weight. Waist circumference. This measures the distance around your waistline. This measurement also tells if you are at a healthy weight and may help predict your risk of certain diseases, such as type 2 diabetes and high blood pressure. Heart rate and blood pressure. Body temperature. Skin for abnormal spots. What immunizations do I need?  Vaccines are usually given at various ages, according to a schedule. Your health care provider will recommend vaccines for you based on your age, medical history, and lifestyle or other factors, such as travel or where you work. What tests do I need? Screening Your health care provider may recommend screening tests for certain conditions. This may include: Lipid and cholesterol levels. Hepatitis C test. Hepatitis B test. HIV (human immunodeficiency virus) test. STI (sexually transmitted infection) testing, if you are at risk. Lung cancer screening. Colorectal cancer screening. Diabetes screening. This is done by checking your blood sugar (glucose) after you have not eaten for a while (fasting). Mammogram. Talk with your health care provider about how often you should have regular mammograms. BRCA-related cancer screening. This may be done if you have a family history of breast, ovarian, tubal, or peritoneal cancers. Bone density scan. This is done to screen for osteoporosis. Talk with your health care provider about your test results, treatment options, and if necessary,  the need for more tests. Follow these instructions at home: Eating and drinking  Eat a diet that includes fresh fruits and vegetables, whole grains, lean protein, and  low-fat dairy products. Limit your intake of foods with high amounts of sugar, saturated fats, and salt. Take vitamin and mineral supplements as recommended by your health care provider. Do not drink alcohol if your health care provider tells you not to drink. If you drink alcohol: Limit how much you have to 0-1 drink a day. Know how much alcohol is in your drink. In the [place] one drink equals one 12 oz bottle of beer (355 mL), one 5 oz glass of wine (148 mL), or one 1 oz glass of hard liquor (44 mL). Lifestyle Brush your teeth every morning and night with fluoride toothpaste. Floss one time each day. Exercise for at least 30 minutes 5 or more days each week. Do not use any products that contain nicotine or tobacco. These products include cigarettes, chewing tobacco, and vaping devices, such as e-cigarettes. If you need help quitting, ask your health care provider. Do not use drugs. If you are sexually active, practice safe sex. Use a condom or other form of protection in order to prevent STIs. Take aspirin only as told by your health care provider. Make sure that you understand how much to take and what form to take. Work with your health care provider to find out whether it is safe and beneficial for you to take aspirin daily. Ask your health care provider if you need to take a cholesterol-lowering medicine (statin). Find healthy ways to manage stress, such as: Meditation, yoga, or listening to music. Journaling. Talking to a trusted person. Spending time with friends and family. Minimize exposure to UV radiation to reduce your risk of skin cancer. Safety Always wear your seat belt while driving or riding in a vehicle. Do not drive: If you have been drinking alcohol. Do not ride with someone who has been drinking. When you are tired or distracted. While texting. If you have been using any mind-altering substances or drugs. Wear a helmet and other protective equipment during sports  activities. If you have firearms in your house, make sure you follow all gun safety procedures. What's next? Visit your health care provider once a year for an annual wellness visit. Ask your health care provider how often you should have your eyes and teeth checked. Stay up to date on all vaccines. This information is not intended to replace advice given to you by your health care provider. Make sure you discuss any questions you have with your health care provider. Document Revised: 11/19/2020 Document Reviewed: 11/19/2020 Elsevier Patient Education  2024 [employer]. Understanding Your Risk for Falls Millions of people have serious injuries from falls each year. It is important to understand your risk of falling. Talk with your health care provider about your risk and what you can do to lower it. If you do have a serious fall, make sure to tell your provider. Falling once raises your risk of falling again. How can falls affect me? Serious injuries from falls are common. These include: Broken bones, such as hip fractures. Head injuries, such as traumatic brain injuries (TBI) or concussions. A fear of falling can cause you to avoid activities and stay at home. This can make your muscles weaker and raise your risk for a fall. What can increase my risk? There are a number of risk factors that increase your risk for falling. The more  risk factors you have, the higher your risk of falling. Serious injuries from a fall happen most often to people who are older than 69 years old. Teenagers and young adults ages 17-29 are also at higher risk. Common risk factors include: Weakness in the lower body. Being generally weak or confused due to long-term (chronic) illness. Dizziness or balance problems. Poor vision. Medicines that cause dizziness or drowsiness. These may include: Medicines for your blood pressure, heart, anxiety, insomnia, or swelling (edema). Pain medicines. Muscle relaxants. Other  risk factors include: Drinking alcohol. Having had a fall in the past. Having foot pain or wearing improper footwear. Working at a dangerous job. Having any of the following in your home: Tripping hazards, such as floor clutter or loose rugs. Poor lighting. Pets. Having dementia or memory loss. What actions can I take to lower my risk of falling?     Physical activity Stay physically fit. Do strength and balance exercises. Consider taking a regular class to build strength and balance. Yoga and tai chi are good options. Vision Have your eyes checked every year and your prescription for glasses or contacts updated as needed. Shoes and walking aids Wear non-skid shoes. Wear shoes that have rubber soles and low heels. Do not wear high heels. Do not walk around the house in socks or slippers. Use a cane or walker as told by your provider. Home safety Attach secure railings on both sides of your stairs. Install grab bars for your bathtub, shower, and toilet. Use a non-skid mat in your bathtub or shower. Attach bath mats securely with double-sided, non-slip rug tape. Use good lighting in all rooms. Keep a flashlight near your bed. Make sure there is a clear path from your bed to the bathroom. Use night-lights. Do not use throw rugs. Make sure all carpeting is taped or tacked down securely. Remove all clutter from walkways and stairways, including extension cords. Repair uneven or broken steps and floors. Avoid walking on icy or slippery surfaces. Walk on the grass instead of on icy or slick sidewalks. Use ice melter to get rid of ice on walkways in the winter. Use a cordless phone. Questions to ask your health care provider Can you help me check my risk for a fall? Do any of my medicines make me more likely to fall? Should I take a vitamin D  supplement? What exercises can I do to improve my strength and balance? Should I make an appointment to have my vision checked? Do I need a bone  density test to check for weak bones (osteoporosis)? Would it help to use a cane or a walker? Where to find more information Centers for Disease Control and Prevention, STEADI: tonerpromos.no Community-Based Fall Prevention Programs: tonerpromos.no [employer] on Aging: baseringtones.pl Contact a health care provider if: You fall at home. You are afraid of falling at home. You feel weak, drowsy, or dizzy. This information is not intended to replace advice given to you by your health care provider. Make sure you discuss any questions you have with your health care provider. Document Revised: 01/25/2022 Document Reviewed: 01/25/2022 Elsevier Patient Education  2024 [employer].

## 2023-06-17 NOTE — Progress Notes (Signed)
 Because this visit was a virtual/telehealth visit,  certain criteria was not obtained, such a blood pressure, CBG if applicable, and timed get up and go. Any medications not marked as taking were not mentioned during the medication reconciliation part of the visit. Any vitals not documented were not able to be obtained due to this being a telehealth visit or patient was unable to self-report a recent blood pressure reading due to a lack of equipment at home via telehealth. Vitals that have been documented are verbally provided by the patient.  Interactive audio and video telecommunications were attempted between this provider and patient, however failed, due to patient having technical difficulties OR patient did not have access to video capability.  We continued and completed visit with audio only.  Subjective:   Michelle Landry is a 68 y.o. female who presents for Medicare Annual (Subsequent) preventive examination.  Visit Complete: Virtual I connected with  Michelle Landry on 06/17/23 by a audio enabled telemedicine application and verified that I am speaking with the correct person using two identifiers.  Patient Location: Home  Provider Location: Home Office  I discussed the limitations of evaluation and management by telemedicine. The patient expressed understanding and agreed to proceed.  Vital Signs: Because this visit was a virtual/telehealth visit, some criteria may be missing or patient reported. Any vitals not documented were not able to be obtained and vitals that have been documented are patient reported.  Patient Medicare AWV questionnaire was completed by the patient on na; I have confirmed that all information answered by patient is correct and no changes since this date.  Cardiac Risk Factors include: hypertension     Objective:    Today's Vitals   06/17/23 0847  BP: 123/73  Pulse: 62  Weight: 177 lb 9.6 oz (80.6 kg)  Height: 5' 6 (1.676 m)   Body mass index is 28.67  kg/m.     06/17/2023    8:47 AM 06/11/2022    9:22 AM 04/28/2022    7:53 PM 06/02/2021    9:10 AM 08/04/2020    8:39 AM 01/31/2018    5:43 PM 06/04/2015    8:20 AM  Advanced Directives  Does Patient Have a Medical Advance Directive? No No No No No No No  Would patient like information on creating a medical advance directive? No - Patient declined Yes (MAU/Ambulatory/Procedural Areas - Information given) No - Patient declined No - Patient declined Yes (MAU/Ambulatory/Procedural Areas - Information given)  No - patient declined information    Current Medications (verified) Outpatient Encounter Medications as of 06/17/2023  Medication Sig   cholecalciferol (VITAMIN D ) 25 MCG (1000 UNIT) tablet Take 1,000 Units by mouth daily.   citalopram  (CELEXA ) 20 MG tablet TAKE 1 TABLET BY MOUTH AT BEDTIME   ibuprofen  (ADVIL ) 200 MG tablet Take 400 mg by mouth every 8 (eight) hours as needed (arthritis pain.).   indapamide  (LOZOL ) 1.25 MG tablet TAKE 1 TABLET BY MOUTH IN THE MORNING FOR BLOOD PRESSURE AND FOR FLUID   lisinopril  (ZESTRIL ) 40 MG tablet TAKE 1 TABLET BY MOUTH ONCE DAILY IN THE MORNING   Multiple Vitamin (MULTIVITAMIN WITH MINERALS) TABS tablet Take 1 tablet by mouth daily.   potassium chloride  (KLOR-CON  M) 10 MEQ tablet Take 1 tablet by mouth once daily   rosuvastatin  (CRESTOR ) 10 MG tablet Take 1 tablet by mouth once daily   vitamin C (ASCORBIC ACID) 500 MG tablet Take 500 mg by mouth daily.   amoxicillin -clavulanate (AUGMENTIN ) 875-125 MG tablet Take  1 tablet by mouth 2 (two) times daily. (Patient not taking: Reported on 06/17/2023)   ipratropium (ATROVENT ) 0.06 % nasal spray Place 2 sprays into both nostrils 4 (four) times daily as needed for rhinitis. (Patient not taking: Reported on 06/17/2023)   meclizine  (ANTIVERT ) 25 MG tablet Take 1 tablet (25 mg total) by mouth 2 (two) times daily as needed for dizziness. (Patient not taking: Reported on 06/17/2023)   No facility-administered  encounter medications on file as of 06/17/2023.    Allergies (verified) Sulfa antibiotics   History: Past Medical History:  Diagnosis Date   Allergic rhinitis    History of bronchitis    Hypertension    Osteoporosis 2012   Past Surgical History:  Procedure Laterality Date   COLONOSCOPY  2006 MJ   ?POLYPS   COLONOSCOPY N/A 06/04/2015   Procedure: COLONOSCOPY;  Surgeon: Margo LITTIE Haddock, MD;  Location: [facility];  Service: Endoscopy;  Laterality: N/A;  9:15   COLONOSCOPY WITH PROPOFOL  N/A 08/04/2020   Procedure: COLONOSCOPY WITH PROPOFOL ;  Surgeon: Cindie Carlin POUR, DO;  Location: [facility];  Service: Endoscopy;  Laterality: N/A;  ASA II/ 9:00   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   POLYPECTOMY  08/04/2020   Procedure: POLYPECTOMY INTESTINAL;  Surgeon: Cindie Carlin POUR, DO;  Location: [facility];  Service: Endoscopy;;  cecal colon polyp; sigmoid colon polyp;    Right Knee Replacement  12/2010   Family History  Problem Relation Age of Onset   Hypertension Mother    Osteoporosis Mother    Heart disease Father 84       MI   Hypertension Sister    Diabetes Sister    Heart disease Sister    Cancer Sister        breast   Social History   Socioeconomic History   Marital status: Single    Spouse name: Not on file   Number of children: Not on file   Years of education: Not on file   Highest education level: Not on file  Occupational History   Not on file  Tobacco Use   Smoking status: Never   Smokeless tobacco: Never  Vaping Use   Vaping status: Never Used  Substance and Sexual Activity   Alcohol use: No    Alcohol/week: 0.0 standard drinks of alcohol   Drug use: No   Sexual activity: Never    Birth control/protection: Abstinence  Other Topics Concern   Not on file  Social History Narrative   Lives alone, has boston terrier named Bandon.   Social Drivers of Corporate Investment Banker Strain: Low Risk  (06/17/2023)   Overall Financial Resource Strain (CARDIA)     Difficulty of Paying Living Expenses: Not hard at all  Food Insecurity: No Food Insecurity (06/17/2023)   Hunger Vital Sign    Worried About Running Out of Food in the Last Year: Never true    Ran Out of Food in the Last Year: Never true  Transportation Needs: No Transportation Needs (06/17/2023)   PRAPARE - Administrator, Civil Service (Medical): No    Lack of Transportation (Non-Medical): No  Physical Activity: Sufficiently Active (06/17/2023)   Exercise Vital Sign    Days of Exercise per Week: 5 days    Minutes of Exercise per Session: 30 min  Stress: No Stress Concern Present (06/17/2023)   [employer] of Occupational Health - Occupational Stress Questionnaire    Feeling of Stress : Not at all  Social Connections: Moderately Isolated (06/17/2023)   Social Connection and Isolation Panel [NHANES]    Frequency of Communication with Friends and Family: More than three times a week    Frequency of Social Gatherings with Friends and Family: More than three times a week    Attends Religious Services: More than 4 times per year    Active Member of [employer] or Organizations: No    Attends Engineer, Structural: Never    Marital Status: Never married    Tobacco Counseling Counseling given: Yes   Clinical Intake:  Pre-visit preparation completed: Yes  Pain : No/denies pain     BMI - recorded: 28.67 Nutritional Risks: None Diabetes: No  How often do you need to have someone help you when you read instructions, pamphlets, or other written materials from your doctor or pharmacy?: 1 - Never  Interpreter Needed?: No  Information entered by :: Ashlee Bewley W, CMA   Activities of Daily Living    06/17/2023    8:56 AM  In your present state of health, do you have any difficulty performing the following activities:  Hearing? 0  Vision? 0  Difficulty concentrating or making decisions? 0  Walking or climbing stairs? 0  Dressing or bathing? 0  Doing errands,  shopping? 0  Preparing Food and eating ? N  Using the Toilet? N  In the past six months, have you accidently leaked urine? N  Do you have problems with loss of bowel control? N  Managing your Medications? N  Managing your Finances? N  Housekeeping or managing your Housekeeping? N    Patient Care Team: Cook, Jayce G, DO as PCP - General (Family Medicine) Cindie Carlin POUR, DO as Consulting Physician (Internal Medicine) Cook, Jayce G, DO as Consulting Physician (Family Medicine)  Indicate any recent Medical Services you may have received from other than [facility] providers in the past year (date may be approximate).     Assessment:   This is a routine wellness examination for Michelle Landry.  Hearing/Vision screen Hearing Screening - Comments:: Patient denies any hearing difficulties.   Vision Screening - Comments:: Wears rx glasses - up to date with routine eye exams  [facility]-Dr. Oneil Kawasaki   Goals Addressed             This Visit's Progress    DIET - REDUCE CALORIE INTAKE   On track    Pt would like to lose weight.       Depression Screen    06/17/2023    8:57 AM 02/21/2023    8:26 AM 08/20/2022    8:35 AM 06/11/2022    9:21 AM 05/21/2022    8:42 AM 05/07/2022    8:33 AM 03/26/2022    4:04 PM  PHQ 2/9 Scores  PHQ - 2 Score 0 0 0 0 0 0 0  PHQ- 9 Score 0 0 0  0      Fall Risk    06/17/2023    8:56 AM 06/11/2022    9:21 AM 05/21/2022    8:42 AM 05/07/2022    8:33 AM 03/26/2022    4:04 PM  Fall Risk   Falls in the past year? 0 0 0 0 0  Number falls in past yr: 0 0 0 0 0  Injury with Fall? 0 0 0 0 0  Risk for fall due to : No Fall Risks No Fall Risks No Fall Risks  No Fall Risks  Follow up Falls prevention discussed Falls evaluation  completed;Education provided;Falls prevention discussed Falls evaluation completed  Falls evaluation completed    MEDICARE RISK AT HOME: Medicare Risk at Home Any stairs in or around the home?: Yes If so, are there any without  handrails?: No Home free of loose throw rugs in walkways, pet beds, electrical cords, etc?: Yes Adequate lighting in your home to reduce risk of falls?: Yes Life alert?: No Use of a cane, walker or w/c?: No Grab bars in the bathroom?: Yes Shower chair or bench in shower?: Yes Elevated toilet seat or a handicapped toilet?: Yes  TIMED UP AND GO:  Was the test performed?  No    Cognitive Function:        06/17/2023    8:50 AM 06/11/2022    9:15 AM 06/02/2021    9:14 AM  6CIT Screen  What Year? 0 points 0 points 0 points  What month? 0 points 0 points 0 points  What time? 0 points 0 points 0 points  Count back from 20 0 points 0 points 0 points  Months in reverse 0 points 0 points 0 points  Repeat phrase 0 points 0 points 0 points  Total Score 0 points 0 points 0 points    Immunizations Immunization History  Administered Date(s) Administered   Influenza, Quadrivalent, Recombinant, Inj, Pf 03/15/2019   Influenza,inj,Quad PF,6+ Mos 03/13/2015, 04/16/2016, 03/28/2017   Influenza-Unspecified 04/07/2012, 03/21/2013, 04/08/2014, 03/07/2018, 03/26/2020, 03/30/2021, 04/16/2022   Moderna Sars-Covid-2 Vaccination 08/16/2019, 09/15/2019, 06/02/2020   Pneumococcal Conjugate-13 12/05/2019   Pneumococcal-Unspecified 03/30/2021   RSV,unspecified 04/16/2022   Td 01/01/2005, 04/30/2015   Zoster, Live 06/24/2014   Zoster, Unspecified 06/22/2021, 08/21/2021    TDAP status: Up to date  Flu Vaccine status: Up to date  Pneumococcal vaccine status: Up to date  Covid-19 vaccine status: Information provided on how to obtain vaccines.   Qualifies for Shingles Vaccine? No   Zostavax completed Yes   Shingrix Completed?: Yes  Screening Tests Health Maintenance  Topic Date Due   Medicare Annual Wellness (AWV)  06/12/2023   INFLUENZA VACCINE  09/05/2023 (Originally 01/06/2023)   DEXA SCAN  12/29/2023   MAMMOGRAM  12/31/2023   DTaP/Tdap/Td (3 - Tdap) 04/29/2025   Colonoscopy  08/04/2025    Hepatitis C Screening  Completed   HPV VACCINES  Aged Out   Pneumonia Vaccine 67+ Years old  Discontinued   COVID-19 Vaccine  Discontinued   Zoster Vaccines- Shingrix  Discontinued    Health Maintenance  Health Maintenance Due  Topic Date Due   Medicare Annual Wellness (AWV)  06/12/2023    Colorectal cancer screening: Type of screening: Colonoscopy. Completed 08/04/2025. Repeat every 5 years  Mammogram status: Completed 12/29/2022. Repeat every year  Bone Density status: Completed 12/28/2021. Results reflect: Bone density results: OSTEOPENIA. Repeat every 2 years.  Lung Cancer Screening: (Low Dose CT Chest recommended if Age 66-80 years, 20 pack-year currently smoking OR have quit w/in 15years.) does not qualify.   Lung Cancer Screening Referral: na  Additional Screening:  Hepatitis C Screening: does not qualify; Completed   Vision Screening: Recommended annual ophthalmology exams for early detection of glaucoma and other disorders of the eye. Is the patient up to date with their annual eye exam?  Yes  Who is the provider or what is the name of the office in which the patient attends annual eye exams? Dr. Darroll @ [facility] [place] If pt is not established with a provider, would they like to be referred to a provider to establish care? No .  Dental Screening: Recommended annual dental exams for proper oral hygiene  Diabetic Foot Exam: na  Community Resource Referral / Chronic Care Management: CRR required this visit?  No   CCM required this visit?  No     Plan:     I have personally reviewed and noted the following in the patient's chart:   Medical and social history Use of alcohol, tobacco or illicit drugs  Current medications and supplements including opioid prescriptions. Patient is not currently taking opioid prescriptions. Functional ability and status Nutritional status Physical activity Advanced directives List of other physicians Hospitalizations,  surgeries, and ER visits in previous 12 months Vitals Screenings to include cognitive, depression, and falls Referrals and appointments  In addition, I have reviewed and discussed with patient certain preventive protocols, quality metrics, and best practice recommendations. A written personalized care plan for preventive services as well as general preventive health recommendations were provided to patient.     [facility] LABOR Ajia Chadderdon, CMA   06/17/2023   After Visit Summary: (Mail) Due to this being a telephonic visit, the after visit summary with patients personalized plan was offered to patient via mail

## 2023-07-17 ENCOUNTER — Other Ambulatory Visit: Payer: Self-pay | Admitting: Family Medicine

## 2023-07-18 ENCOUNTER — Other Ambulatory Visit: Payer: Self-pay

## 2023-07-18 MED ORDER — ROSUVASTATIN CALCIUM 10 MG PO TABS: 10.0000 mg | ORAL_TABLET | Freq: Every day | ORAL | 0 refills | Status: DC

## 2023-08-11 ENCOUNTER — Other Ambulatory Visit: Payer: Self-pay | Admitting: Family Medicine

## 2023-08-11 DIAGNOSIS — I10 Essential (primary) hypertension: Secondary | ICD-10-CM

## 2023-08-18 ENCOUNTER — Telehealth: Payer: Self-pay | Admitting: Family Medicine

## 2023-08-18 DIAGNOSIS — I10 Essential (primary) hypertension: Secondary | ICD-10-CM

## 2023-08-18 MED ORDER — POTASSIUM CHLORIDE CRYS ER 10 MEQ PO TBCR: 10.0000 meq | EXTENDED_RELEASE_TABLET | Freq: Every day | ORAL | 0 refills | Status: DC

## 2023-08-18 NOTE — Telephone Encounter (Signed)
 Requesting a refill  on Pot Chloride CR 90 supply send to [facility] =[place]

## 2023-08-18 NOTE — Telephone Encounter (Signed)
Received via fax Rx request: Prescription sent electronically to pharmacy  

## 2023-08-22 ENCOUNTER — Ambulatory Visit: Payer: Medicare HMO | Admitting: Family Medicine

## 2023-08-23 ENCOUNTER — Ambulatory Visit: Admitting: Family Medicine

## 2023-08-23 VITALS — BP 130/78 | HR 52 | Temp 98.6°F | Ht 66.0 in | Wt 174.0 lb

## 2023-08-23 DIAGNOSIS — R7303 Prediabetes: Secondary | ICD-10-CM | POA: Diagnosis not present

## 2023-08-23 DIAGNOSIS — E7800 Pure hypercholesterolemia, unspecified: Secondary | ICD-10-CM

## 2023-08-23 DIAGNOSIS — M810 Age-related osteoporosis without current pathological fracture: Secondary | ICD-10-CM

## 2023-08-23 DIAGNOSIS — Z130 Encounter for screening for diseases of the blood and blood-forming organs and certain disorders involving the immune mechanism: Secondary | ICD-10-CM | POA: Diagnosis not present

## 2023-08-23 DIAGNOSIS — I10 Essential (primary) hypertension: Secondary | ICD-10-CM | POA: Diagnosis not present

## 2023-08-23 MED ORDER — LISINOPRIL 40 MG PO TABS: 40.0000 mg | ORAL_TABLET | Freq: Every morning | ORAL | 3 refills | Status: AC

## 2023-08-23 NOTE — Assessment & Plan Note (Signed)
 Stable.  Continue lisinopril and indapamide.

## 2023-08-23 NOTE — Assessment & Plan Note (Signed)
 Stable. Continue Crestor.

## 2023-08-23 NOTE — Patient Instructions (Signed)
 Labs today.  Follow up in 6 months.

## 2023-08-23 NOTE — Progress Notes (Signed)
 Subjective:  Patient ID: Michelle Landry, female    DOB: [birth year]  Age: 68 y.o. MRN: [id number]  CC:  Follow up   HPI:  68 year old female presents for follow-up.  Hypertension stable on lisinopril and indapamide.  Lipids stable on Crestor.  She is tolerating well.  Patient states that she is doing well.  No chest pain or shortness of breath.  She has no complaints or concerns at this time.  Preventative care/health maintenance items up-to-date.  Patient Active Problem List   Diagnosis Date Noted   Murmur 05/07/2022   Hyperlipidemia 05/07/2021   Osteoarthritis 10/30/2015   Osteoporosis 12/03/2012   Hypertension 12/03/2012   Depression with anxiety 12/03/2012    Social Hx   Social History   Socioeconomic History   Marital status: Single    Spouse name: Not on file   Number of children: Not on file   Years of education: Not on file   Highest education level: Not on file  Occupational History   Not on file  Tobacco Use   Smoking status: Never   Smokeless tobacco: Never  Vaping Use   Vaping status: Never Used  Substance and Sexual Activity   Alcohol use: No    Alcohol/week: 0.0 standard drinks of alcohol   Drug use: No   Sexual activity: Never    Birth control/protection: Abstinence  Other Topics Concern   Not on file  Social History Narrative   Lives alone, has boston terrier named Benitez.   Social Drivers of Corporate investment banker Strain: Low Risk  (06/17/2023)   Overall Financial Resource Strain (CARDIA)    Difficulty of Paying Living Expenses: Not hard at all  Food Insecurity: No Food Insecurity (06/17/2023)   Hunger Vital Sign    Worried About Running Out of Food in the Last Year: Never true    Ran Out of Food in the Last Year: Never true  Transportation Needs: No Transportation Needs (06/17/2023)   PRAPARE - Administrator, Civil Service (Medical): No    Lack of Transportation (Non-Medical): No  Physical Activity: Sufficiently  Active (06/17/2023)   Exercise Vital Sign    Days of Exercise per Week: 5 days    Minutes of Exercise per Session: 30 min  Stress: No Stress Concern Present (06/17/2023)   [employer] of Occupational Health - Occupational Stress Questionnaire    Feeling of Stress : Not at all  Social Connections: Moderately Isolated (06/17/2023)   Social Connection and Isolation Panel [NHANES]    Frequency of Communication with Friends and Family: More than three times a week    Frequency of Social Gatherings with Friends and Family: More than three times a week    Attends Religious Services: More than 4 times per year    Active Member of [employer] or Organizations: No    Attends Engineer, structural: Never    Marital Status: Never married    Review of Systems Per HPI  Objective:  BP 130/78   Pulse (!) 52   Temp 98.6 F (37 C)   Ht 5\' 6"  (1.676 m)   Wt 174 lb (78.9 kg)   SpO2 97%   BMI 28.08 kg/m      08/23/2023    9:11 AM 06/17/2023    8:47 AM 02/21/2023    8:19 AM  BP/Weight  Systolic BP 130 123 138  Diastolic BP 78 73 87  Wt. (Lbs) 174 177.6 177  BMI 28.08 kg/m2 28.67 kg/m2 28.57  kg/m2    Physical Exam Vitals and nursing note reviewed.  Constitutional:      General: She is not in acute distress.    Appearance: Normal appearance.  HENT:     Head: Normocephalic and atraumatic.  Eyes:     General:        Right eye: No discharge.        Left eye: No discharge.     Conjunctiva/sclera: Conjunctivae normal.  Cardiovascular:     Rate and Rhythm: Normal rate and regular rhythm.     Heart sounds: Murmur heard.  Pulmonary:     Effort: Pulmonary effort is normal.     Breath sounds: Normal breath sounds. No wheezing, rhonchi or rales.  Neurological:     Mental Status: She is alert.  Psychiatric:        Mood and Affect: Mood normal.        Behavior: Behavior normal.     Lab Results  Component Value Date   WBC 4.0 02/21/2023   HGB 13.6 02/21/2023   HCT 41.9 02/21/2023    PLT 225 02/21/2023   GLUCOSE 107 (H) 02/21/2023   CHOL 109 02/21/2023   TRIG 98 02/21/2023   HDL 39 (L) 02/21/2023   LDLCALC 51 02/21/2023   ALT 27 02/21/2023   AST 25 02/21/2023   NA 144 02/21/2023   K 3.9 02/21/2023   CL 103 02/21/2023   CREATININE 0.77 02/21/2023   BUN 10 02/21/2023   CO2 24 02/21/2023   TSH 1.620 12/02/2020   INR 0.95 12/17/2010   HGBA1C 5.8 (H) 08/20/2022     Assessment & Plan:  Primary hypertension Assessment & Plan: Stable.  Continue lisinopril and indapamide.  Orders: -     CMP14+EGFR -     Lisinopril; Take 1 tablet (40 mg total) by mouth every morning.  Dispense: 90 tablet; Refill: 3  Pure hypercholesterolemia Assessment & Plan: Stable.  Continue Crestor.  Orders: -     Lipid panel  Prediabetes -     Hemoglobin A1c  Screening for deficiency anemia -     CBC  Osteoporosis without current pathological fracture, unspecified osteoporosis type    Follow-up: 6 months  Layla Gramm Adriana Simas DO [facility]

## 2023-08-24 LAB — CMP14+EGFR
ALT: 16 IU/L (ref 0–32)
AST: 17 IU/L (ref 0–40)
Albumin: 4.7 g/dL (ref 3.9–4.9)
Alkaline Phosphatase: 90 IU/L (ref 44–121)
BUN/Creatinine Ratio: 19 (ref 12–28)
BUN: 13 mg/dL (ref 8–27)
Bilirubin Total: 0.5 mg/dL (ref 0.0–1.2)
CO2: 25 mmol/L (ref 20–29)
Calcium: 9.9 mg/dL (ref 8.7–10.3)
Chloride: 103 mmol/L (ref 96–106)
Creatinine, Ser: 0.67 mg/dL (ref 0.57–1.00)
Globulin, Total: 2.2 g/dL (ref 1.5–4.5)
Glucose: 98 mg/dL (ref 70–99)
Potassium: 4.4 mmol/L (ref 3.5–5.2)
Sodium: 142 mmol/L (ref 134–144)
Total Protein: 6.9 g/dL (ref 6.0–8.5)
eGFR: 95 mL/min/{1.73_m2} (ref >59)

## 2023-08-24 LAB — HEMOGLOBIN A1C
Est. average glucose Bld gHb Est-mCnc: 114 mg/dL
Hgb A1c MFr Bld: 5.6 % (ref 4.8–5.6)

## 2023-08-24 LAB — CBC
Hematocrit: 44.6 % (ref 34.0–46.6)
Hemoglobin: 14.6 g/dL (ref 11.1–15.9)
MCH: 28.9 pg (ref 26.6–33.0)
MCHC: 32.7 g/dL (ref 31.5–35.7)
MCV: 88 fL (ref 79–97)
Platelets: 248 10*3/uL (ref 150–450)
RBC: 5.05 x10E6/uL (ref 3.77–5.28)
RDW: 13.2 % (ref 11.7–15.4)
WBC: 4.4 10*3/uL (ref 3.4–10.8)

## 2023-08-24 LAB — LIPID PANEL
Chol/HDL Ratio: 2.1 ratio (ref 0.0–4.4)
Cholesterol, Total: 116 mg/dL (ref 100–199)
HDL: 55 mg/dL (ref >39)
LDL Chol Calc (NIH): 47 mg/dL (ref 0–99)
Triglycerides: 69 mg/dL (ref 0–149)
VLDL Cholesterol Cal: 14 mg/dL (ref 5–40)

## 2023-09-10 ENCOUNTER — Other Ambulatory Visit: Payer: Self-pay | Admitting: Family Medicine

## 2023-09-10 DIAGNOSIS — F418 Other specified anxiety disorders: Secondary | ICD-10-CM

## 2023-10-10 ENCOUNTER — Other Ambulatory Visit: Payer: Self-pay | Admitting: Family Medicine

## 2023-10-24 ENCOUNTER — Other Ambulatory Visit (HOSPITAL_COMMUNITY): Payer: Self-pay | Admitting: Family Medicine

## 2023-10-24 DIAGNOSIS — Z1231 Encounter for screening mammogram for malignant neoplasm of breast: Secondary | ICD-10-CM

## 2023-11-10 ENCOUNTER — Other Ambulatory Visit: Payer: Self-pay | Admitting: Family Medicine

## 2023-11-10 DIAGNOSIS — I10 Essential (primary) hypertension: Secondary | ICD-10-CM

## 2023-12-12 ENCOUNTER — Other Ambulatory Visit: Payer: Self-pay | Admitting: Family Medicine

## 2023-12-12 DIAGNOSIS — F418 Other specified anxiety disorders: Secondary | ICD-10-CM

## 2023-12-26 ENCOUNTER — Other Ambulatory Visit: Payer: Self-pay

## 2023-12-26 DIAGNOSIS — M810 Age-related osteoporosis without current pathological fracture: Secondary | ICD-10-CM

## 2024-01-02 ENCOUNTER — Ambulatory Visit (HOSPITAL_COMMUNITY)
Admission: RE | Admit: 2024-01-02 | Discharge: 2024-01-02 | Disposition: A | Discharge status: Home / Self Care | Source: Ambulatory Visit | Attending: Family Medicine | Admitting: Family Medicine

## 2024-01-02 DIAGNOSIS — Z1231 Encounter for screening mammogram for malignant neoplasm of breast: Secondary | ICD-10-CM | POA: Diagnosis not present

## 2024-01-09 ENCOUNTER — Other Ambulatory Visit: Payer: Self-pay | Admitting: Family Medicine

## 2024-02-07 ENCOUNTER — Other Ambulatory Visit: Payer: Self-pay | Admitting: Family Medicine

## 2024-02-07 DIAGNOSIS — I10 Essential (primary) hypertension: Secondary | ICD-10-CM

## 2024-02-08 ENCOUNTER — Other Ambulatory Visit: Payer: Self-pay

## 2024-02-08 DIAGNOSIS — I10 Essential (primary) hypertension: Secondary | ICD-10-CM

## 2024-02-08 MED ORDER — INDAPAMIDE 1.25 MG PO TABS: ORAL_TABLET | ORAL | 0 refills | Status: DC

## 2024-02-08 MED ORDER — POTASSIUM CHLORIDE ER 10 MEQ PO TBCR: 10.0000 meq | EXTENDED_RELEASE_TABLET | Freq: Every day | ORAL | 0 refills | Status: DC

## 2024-02-23 ENCOUNTER — Ambulatory Visit: Admitting: Family Medicine

## 2024-02-23 ENCOUNTER — Encounter: Payer: Self-pay | Admitting: Family Medicine

## 2024-02-23 VITALS — BP 137/81 | HR 55 | Temp 97.2°F | Ht 66.0 in | Wt 171.0 lb

## 2024-02-23 DIAGNOSIS — I10 Essential (primary) hypertension: Secondary | ICD-10-CM

## 2024-02-23 DIAGNOSIS — E7800 Pure hypercholesterolemia, unspecified: Secondary | ICD-10-CM | POA: Diagnosis not present

## 2024-02-23 DIAGNOSIS — F418 Other specified anxiety disorders: Secondary | ICD-10-CM

## 2024-02-23 DIAGNOSIS — M810 Age-related osteoporosis without current pathological fracture: Secondary | ICD-10-CM

## 2024-02-23 MED ORDER — CITALOPRAM HYDROBROMIDE 20 MG PO TABS: 20.0000 mg | ORAL_TABLET | Freq: Every day | ORAL | 3 refills | Status: AC

## 2024-02-23 NOTE — Assessment & Plan Note (Signed)
At goal.  Continue Crestor. 

## 2024-02-23 NOTE — Progress Notes (Signed)
 Subjective:  Patient ID: Michelle Landry, female    DOB: August 28, 1955  Age: 68 y.o. MRN: [id number]  CC:   Chief Complaint  Patient presents with   6 month follow up     No concerns voiced    HPI:  68 year old female presents for follow-up.  Patient states that she is doing well.  No chest pain or shortness of breath.  He is due for DEXA scan.  Will order.  Blood pressure stable on indapamide  and lisinopril .  Lipids have been well-controlled on Crestor .  She is tolerating well.  Anxiety and depression stable as well.  Patient Active Problem List   Diagnosis Date Noted   Murmur 05/07/2022   Hyperlipidemia 05/07/2021   Osteoarthritis 10/30/2015   Osteoporosis 12/03/2012   Hypertension 12/03/2012   Depression with anxiety 12/03/2012    Social Hx   Social History   Socioeconomic History   Marital status: Single    Spouse name: Not on file   Number of children: Not on file   Years of education: Not on file   Highest education level: Not on file  Occupational History   Not on file  Tobacco Use   Smoking status: Never   Smokeless tobacco: Never  Vaping Use   Vaping status: Never Used  Substance and Sexual Activity   Alcohol use: No    Alcohol/week: 0.0 standard drinks of alcohol   Drug use: No   Sexual activity: Never    Birth control/protection: Abstinence  Other Topics Concern   Not on file  Social History Narrative   Lives alone, has boston terrier named Nickerson.   Social Drivers of Corporate investment banker Strain: Low Risk  (06/17/2023)   Overall Financial Resource Strain (CARDIA)    Difficulty of Paying Living Expenses: Not hard at all  Food Insecurity: No Food Insecurity (06/17/2023)   Hunger Vital Sign    Worried About Running Out of Food in the Last Year: Never true    Ran Out of Food in the Last Year: Never true  Transportation Needs: No Transportation Needs (06/17/2023)   PRAPARE - Administrator, Civil Service (Medical): No    Lack  of Transportation (Non-Medical): No  Physical Activity: Sufficiently Active (06/17/2023)   Exercise Vital Sign    Days of Exercise per Week: 5 days    Minutes of Exercise per Session: 30 min  Stress: No Stress Concern Present (06/17/2023)   [employer] of Occupational Health - Occupational Stress Questionnaire    Feeling of Stress : Not at all  Social Connections: Moderately Isolated (06/17/2023)   Social Connection and Isolation Panel    Frequency of Communication with Friends and Family: More than three times a week    Frequency of Social Gatherings with Friends and Family: More than three times a week    Attends Religious Services: More than 4 times per year    Active Member of [employer] or Organizations: No    Attends Engineer, structural: Never    Marital Status: Never married    Review of Systems Per HPI  Objective:  BP 137/81   Pulse (!) 55   Temp (!) 97.2 F (36.2 C)   Ht 5' 6 (1.676 m)   Wt 171 lb (77.6 kg)   SpO2 97%   BMI 27.60 kg/m      02/23/2024    8:53 AM 08/23/2023    9:11 AM 06/17/2023    8:47 AM  BP/Weight  Systolic  BP 137 130 123  Diastolic BP 81 78 73  Wt. (Lbs) 171 174 177.6  BMI 27.6 kg/m2 28.08 kg/m2 28.67 kg/m2    Physical Exam Vitals and nursing note reviewed.  Constitutional:      General: She is not in acute distress.    Appearance: Normal appearance.  HENT:     Head: Normocephalic and atraumatic.  Eyes:     General:        Right eye: No discharge.        Left eye: No discharge.     Conjunctiva/sclera: Conjunctivae normal.  Cardiovascular:     Rate and Rhythm: Normal rate and regular rhythm.  Pulmonary:     Effort: Pulmonary effort is normal.     Breath sounds: Normal breath sounds. No wheezing, rhonchi or rales.  Neurological:     Mental Status: She is alert.  Psychiatric:        Mood and Affect: Mood normal.        Behavior: Behavior normal.     Lab Results  Component Value Date   WBC 4.4 08/23/2023   HGB 14.6  08/23/2023   HCT 44.6 08/23/2023   PLT 248 08/23/2023   GLUCOSE 98 08/23/2023   CHOL 116 08/23/2023   TRIG 69 08/23/2023   HDL 55 08/23/2023   LDLCALC 47 08/23/2023   ALT 16 08/23/2023   AST 17 08/23/2023   NA 142 08/23/2023   K 4.4 08/23/2023   CL 103 08/23/2023   CREATININE 0.67 08/23/2023   BUN 13 08/23/2023   CO2 25 08/23/2023   TSH 1.620 12/02/2020   INR 0.95 12/17/2010   HGBA1C 5.6 08/23/2023     Assessment & Plan:  Primary hypertension Assessment & Plan: Stable.  Continue current medications.   Osteoporosis without current pathological fracture, unspecified osteoporosis type Assessment & Plan: Repeating DEXA scan.  Orders: -     DG Bone Density  Depression with anxiety Assessment & Plan: Stable. Celexa  refilled.   Orders: -     Citalopram  Hydrobromide; Take 1 tablet (20 mg total) by mouth at bedtime.  Dispense: 90 tablet; Refill: 3  Pure hypercholesterolemia Assessment & Plan: At goal.  Continue Crestor .     Follow-up:  6 months  Taichi Repka Bluford DO [facility]

## 2024-02-23 NOTE — Assessment & Plan Note (Signed)
Stable. Celexa refilled. 

## 2024-02-23 NOTE — Assessment & Plan Note (Signed)
 Stable.  Continue current medications.

## 2024-02-23 NOTE — Assessment & Plan Note (Signed)
 Repeating DEXA scan

## 2024-02-23 NOTE — Patient Instructions (Signed)
 Call [phone] to schedule Bone density.  Follow up in 6 months.

## 2024-02-27 ENCOUNTER — Ambulatory Visit (HOSPITAL_COMMUNITY)
Admission: RE | Admit: 2024-02-27 | Discharge: 2024-02-27 | Disposition: A | Discharge status: Home / Self Care | Source: Ambulatory Visit | Attending: Family Medicine | Admitting: Family Medicine

## 2024-02-27 ENCOUNTER — Ambulatory Visit: Payer: Self-pay | Admitting: Family Medicine

## 2024-02-27 DIAGNOSIS — M810 Age-related osteoporosis without current pathological fracture: Secondary | ICD-10-CM | POA: Insufficient documentation

## 2024-02-27 DIAGNOSIS — M8589 Other specified disorders of bone density and structure, multiple sites: Secondary | ICD-10-CM | POA: Diagnosis not present

## 2024-02-27 DIAGNOSIS — Z780 Asymptomatic menopausal state: Secondary | ICD-10-CM | POA: Diagnosis not present

## 2024-02-28 NOTE — Telephone Encounter (Signed)
-----   Message from Jacqulyn KANDICE Ahle sent at 02/27/2024  9:18 AM EDT ----- Osteopenia. Will continue to monitor. ----- Message ----- From: Interface, Rad Results In Sent: 02/27/2024   9:12 AM EDT To: Jayce G Cook, DO

## 2024-02-28 NOTE — Telephone Encounter (Signed)
 Patient aware of results and recommendations.

## 2024-04-08 ENCOUNTER — Other Ambulatory Visit: Payer: Self-pay | Admitting: Family Medicine

## 2024-05-06 ENCOUNTER — Other Ambulatory Visit: Payer: Self-pay | Admitting: Family Medicine

## 2024-05-06 DIAGNOSIS — I10 Essential (primary) hypertension: Secondary | ICD-10-CM

## 2024-06-22 ENCOUNTER — Ambulatory Visit (INDEPENDENT_AMBULATORY_CARE_PROVIDER_SITE_OTHER): Payer: Self-pay

## 2024-06-22 DIAGNOSIS — Z0000 Encounter for general adult medical examination without abnormal findings: Secondary | ICD-10-CM

## 2024-06-22 NOTE — Progress Notes (Signed)
 "  Chief Complaint  Patient presents with   Medicare Wellness     Subjective:   Michelle Landry is a 69 y.o. female who presents for a Medicare Annual Wellness Visit.  Visit info / Clinical Intake: Medicare Wellness Visit Type:: Subsequent Annual Wellness Visit Persons participating in visit and providing information:: patient Medicare Wellness Visit Mode:: Telephone If telephone:: video declined Since this visit was completed virtually, some vitals may be partially provided or unavailable. Missing vitals are due to the limitations of the virtual format.: Unable to obtain vitals - no equipment If Telephone or Video please confirm:: I connected with patient using audio/video enable telemedicine. I verified patient identity with two identifiers, discussed telehealth limitations, and patient agreed to proceed. Patient Location:: home Provider Location:: office Interpreter Needed?: No Pre-visit prep was completed: yes AWV questionnaire completed by patient prior to visit?: no Living arrangements:: (!) lives alone Patient's Overall Health Status Rating: good Typical amount of pain: some Does pain affect daily life?: no Are you currently prescribed opioids?: no  Dietary Habits and Nutritional Risks How many meals a day?: 3 Eats fruit and vegetables daily?: yes Most meals are obtained by: preparing own meals In the last 2 weeks, have you had any of the following?: none Diabetic:: no  Functional Status Activities of Daily Living (to include ambulation/medication): Independent Ambulation: Independent Medication Administration: Independent Home Management (perform basic housework or laundry): Independent Manage your own finances?: yes Primary transportation is: driving Concerns about vision?: no *vision screening is required for WTM* Concerns about hearing?: no  Fall Screening Falls in the past year?: 0 Number of falls in past year: 0 Was there an injury with Fall?: 0 Fall Risk  Category Calculator: 0 Patient Fall Risk Level: Low Fall Risk  Fall Risk Patient at Risk for Falls Due to: No Fall Risks Fall risk Follow up: Falls prevention discussed; Education provided; Falls evaluation completed  Home and Transportation Safety: All rugs have non-skid backing?: yes All stairs or steps have railings?: yes Grab bars in the bathtub or shower?: yes Have non-skid surface in bathtub or shower?: yes Good home lighting?: yes Regular seat belt use?: yes Hospital stays in the last year:: no  Cognitive Assessment Difficulty concentrating, remembering, or making decisions? : no Will 6CIT or Mini Cog be Completed: no 6CIT or Mini Cog Declined: patient alert, oriented, able to answer questions appropriately and recall recent events  Advance Directives (For Healthcare) Does Patient Have a Medical Advance Directive?: No Would patient like information on creating a medical advance directive?: Yes (MAU/Ambulatory/Procedural Areas - Information given)  Reviewed/Updated  Reviewed/Updated: Reviewed All (Medical, Surgical, Family, Medications, Allergies, Care Teams, Patient Goals)    Allergies (verified) Sulfa antibiotics   Current Medications (verified) Outpatient Encounter Medications as of 06/22/2024  Medication Sig   cholecalciferol (VITAMIN D ) 25 MCG (1000 UNIT) tablet Take 1,000 Units by mouth daily.   citalopram  (CELEXA ) 20 MG tablet Take 1 tablet (20 mg total) by mouth at bedtime.   ibuprofen  (ADVIL ) 200 MG tablet Take 400 mg by mouth every 8 (eight) hours as needed (arthritis pain.).   indapamide  (LOZOL ) 1.25 MG tablet TAKE 1 TABLET BY MOUTH IN THE MORNING FOR BLOOD PRESSURE AND FOR FLUID   lisinopril  (ZESTRIL ) 40 MG tablet Take 1 tablet (40 mg total) by mouth every morning.   Multiple Vitamin (MULTIVITAMIN WITH MINERALS) TABS tablet Take 1 tablet by mouth daily.   potassium chloride  (KLOR-CON ) 10 MEQ tablet Take 1 tablet by mouth once daily  rosuvastatin  (CRESTOR )  10 MG tablet Take 1 tablet by mouth once daily   vitamin C (ASCORBIC ACID) 500 MG tablet Take 500 mg by mouth daily.   No facility-administered encounter medications on file as of 06/22/2024.    History: Past Medical History:  Diagnosis Date   Allergic rhinitis    History of bronchitis    Hypertension    Osteoporosis 2012   Past Surgical History:  Procedure Laterality Date   COLONOSCOPY  2006 MJ   ?POLYPS   COLONOSCOPY N/A 06/04/2015   Procedure: COLONOSCOPY;  Surgeon: Margo LITTIE Haddock, MD;  [facility];  Service: Endoscopy;  Laterality: N/A;  9:15   COLONOSCOPY WITH PROPOFOL  N/A 08/04/2020   Procedure: COLONOSCOPY WITH PROPOFOL ;  Surgeon: Cindie Carlin POUR, DO;  Location: [facility];  Service: Endoscopy;  Laterality: N/A;  ASA II/ 9:00   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   POLYPECTOMY  08/04/2020   Procedure: POLYPECTOMY INTESTINAL;  Surgeon: Cindie Carlin POUR, DO;  Location: [facility];  Service: Endoscopy;;  cecal colon polyp; sigmoid colon polyp;    Right Knee Replacement  12/2010   Family History  Problem Relation Age of Onset   Hypertension Mother    Osteoporosis Mother    Heart disease Father 53       MI   Hypertension Sister    Diabetes Sister    Heart disease Sister    Cancer Sister        breast   Social History   Occupational History   Not on file  Tobacco Use   Smoking status: Never   Smokeless tobacco: Never  Vaping Use   Vaping status: Never Used  Substance and Sexual Activity   Alcohol use: No    Alcohol/week: 0.0 standard drinks of alcohol   Drug use: No   Sexual activity: Never    Birth control/protection: Abstinence   Tobacco Counseling Counseling given: Not Answered  SDOH Screenings   Food Insecurity: No Food Insecurity (06/22/2024)  Housing: Low Risk (06/22/2024)  Transportation Needs: No Transportation Needs (06/22/2024)  Utilities: Not At Risk (06/22/2024)  Alcohol Screen: Low Risk (06/17/2023)  Depression (PHQ2-9): Low Risk  (06/22/2024)  Financial Resource Strain: Low Risk (06/17/2023)  Physical Activity: Insufficiently Active (06/22/2024)  Social Connections: Moderately Isolated (06/22/2024)  Stress: No Stress Concern Present (06/22/2024)  Tobacco Use: Low Risk (06/22/2024)  Health Literacy: Adequate Health Literacy (06/22/2024)   See flowsheets for full screening details  Depression Screen PHQ 2 & 9 Depression Scale- Over the past 2 weeks, how often have you been bothered by any of the following problems? Little interest or pleasure in doing things: 0 Feeling down, depressed, or hopeless (PHQ Adolescent also includes...irritable): 0 PHQ-2 Total Score: 0 Trouble falling or staying asleep, or sleeping too much: 0 Feeling tired or having little energy: 0 Poor appetite or overeating (PHQ Adolescent also includes...weight loss): 0 Feeling bad about yourself - or that you are a failure or have let yourself or your family down: 0 Trouble concentrating on things, such as reading the newspaper or watching television (PHQ Adolescent also includes...like school work): 0 Moving or speaking so slowly that other people could have noticed. Or the opposite - being so fidgety or restless that you have been moving around a lot more than usual: 0 Thoughts that you would be better off dead, or of hurting yourself in some way: 0 PHQ-9 Total Score: 0 If you checked off any problems, how difficult have these problems made  it for you to do your work, take care of things at home, or get along with other people?: Not difficult at all     Goals Addressed             This Visit's Progress    Remain active and independent   On track            Objective:    Today's Vitals   There is no height or weight on file to calculate BMI.  Hearing/Vision screen Hearing Screening - Comments:: Patient is able to hear conversational tones without difficulty. No issues reported.   Vision Screening - Comments:: Wears rx glasses - up to  date with routine eye exams with [facility] [place]  Immunizations and Health Maintenance Health Maintenance  Topic Date Due   Mammogram  01/01/2025   DTaP/Tdap/Td (3 - Tdap) 04/29/2025   Medicare Annual Wellness (AWV)  06/22/2025   Colonoscopy  08/04/2025   Bone Density Scan  02/26/2026   Influenza Vaccine  Completed   Hepatitis C Screening  Completed   Meningococcal B Vaccine  Aged Out   Pneumococcal Vaccine: 50+ Years  Discontinued   COVID-19 Vaccine  Discontinued   Zoster Vaccines- Shingrix  Discontinued        Assessment/Plan:  This is a routine wellness examination for Michelle Landry.  Patient Care Team: Cook, Jayce G, DO as PCP - General (Family Medicine) Cindie Carlin POUR, DO as Consulting Physician (Internal Medicine) Darroll Anes, DO [facility])  I have personally reviewed and noted the following in the patients chart:   Medical and social history Use of alcohol, tobacco or illicit drugs  Current medications and supplements including opioid prescriptions. Functional ability and status Nutritional status Physical activity Advanced directives List of other physicians Hospitalizations, surgeries, and ER visits in previous 12 months Vitals Screenings to include cognitive, depression, and falls Referrals and appointments  No orders of the defined types were placed in this encounter.  In addition, I have reviewed and discussed with patient certain preventive protocols, quality metrics, and best practice recommendations. A written personalized care plan for preventive services as well as general preventive health recommendations were provided to patient.   Michelle Charmaine Browner, LPN   8/83/7973   Return in 1 year (on 06/22/2025).  After Visit Summary: (Mail) Due to this being a telephonic visit, the after visit summary with patients personalized plan was offered to patient via mail   Nurse Notes: Patient advised to keep follow-up appointment with PCP (08/22/24 @  8:20)  "

## 2024-06-22 NOTE — Patient Instructions (Addendum)
 Ms. Raap,  Thank you for taking the time for your Medicare Wellness Visit. I appreciate your continued commitment to your health goals. Please review the care plan we discussed, and feel free to reach out if I can assist you further.  Please note that Annual Wellness Visits do not include a physical exam. Some assessments may be limited, especially if the visit was conducted virtually. If needed, we may recommend an in-person follow-up with your provider.  Ongoing Care Seeing your primary care provider every 3 to 6 months helps us  monitor your health and provide consistent, personalized care.   Referrals If a referral was made during today's visit and you haven't received any updates within two weeks, please contact the referred provider directly to check on the status.  Recommended Screenings:  Health Maintenance  Topic Date Due   Breast Cancer Screening  01/01/2025   DTaP/Tdap/Td vaccine (3 - Tdap) 04/29/2025   Medicare Annual Wellness Visit  06/22/2025   Colon Cancer Screening  08/04/2025   Osteoporosis screening with Bone Density Scan  02/26/2026   Flu Shot  Completed   Hepatitis C Screening  Completed   Meningitis B Vaccine  Aged Out   Pneumococcal Vaccine for age over 61  Discontinued   COVID-19 Vaccine  Discontinued   Zoster (Shingles) Vaccine  Discontinued       06/22/2024    9:04 AM  Advanced Directives  Does Patient Have a Medical Advance Directive? No  Would patient like information on creating a medical advance directive? Yes (MAU/Ambulatory/Procedural Areas - Information given)   Information on Advanced Care Planning can be found at [place]  [employer] [facility] [employer] Advance Health Care Directives Advance Health Care Directives ([email or web address]   Vision: Annual vision screenings are recommended for early detection of glaucoma, cataracts, and diabetic retinopathy. These exams can also reveal signs of chronic conditions such as diabetes and high blood pressure.  Dental:  Annual dental screenings help detect early signs of oral cancer, gum disease, and other conditions linked to overall health, including heart disease and diabetes.  Please see the attached documents for additional preventive care recommendations.

## 2024-08-22 ENCOUNTER — Ambulatory Visit: Admitting: Family Medicine
